# Patient Record
Sex: Female | Born: 1950 | Race: Asian | Hispanic: No | Marital: Married | Smoking: Never smoker
Health system: Southern US, Community
[De-identification: ages and names within clinical notes are randomized; demographics above are authoritative.]

## PROBLEM LIST (undated history)

## (undated) DIAGNOSIS — I10 Essential (primary) hypertension: Secondary | ICD-10-CM

## (undated) DIAGNOSIS — Z9889 Other specified postprocedural states: Secondary | ICD-10-CM

## (undated) DIAGNOSIS — E785 Hyperlipidemia, unspecified: Secondary | ICD-10-CM

## (undated) DIAGNOSIS — F329 Major depressive disorder, single episode, unspecified: Secondary | ICD-10-CM

## (undated) DIAGNOSIS — R42 Dizziness and giddiness: Secondary | ICD-10-CM

## (undated) DIAGNOSIS — Z860100 Personal history of colon polyps, unspecified: Secondary | ICD-10-CM

## (undated) DIAGNOSIS — I351 Nonrheumatic aortic (valve) insufficiency: Secondary | ICD-10-CM

## (undated) DIAGNOSIS — E119 Type 2 diabetes mellitus without complications: Secondary | ICD-10-CM

## (undated) DIAGNOSIS — G4700 Insomnia, unspecified: Secondary | ICD-10-CM

## (undated) DIAGNOSIS — K219 Gastro-esophageal reflux disease without esophagitis: Secondary | ICD-10-CM

## (undated) DIAGNOSIS — K279 Peptic ulcer, site unspecified, unspecified as acute or chronic, without hemorrhage or perforation: Secondary | ICD-10-CM

## (undated) DIAGNOSIS — Z9289 Personal history of other medical treatment: Secondary | ICD-10-CM

## (undated) DIAGNOSIS — F32A Depression, unspecified: Secondary | ICD-10-CM

## (undated) HISTORY — DX: Peptic ulcer, site unspecified, unspecified as acute or chronic, without hemorrhage or perforation: K27.9

## (undated) HISTORY — DX: Hyperlipidemia, unspecified: E78.5

## (undated) HISTORY — DX: Nonrheumatic aortic (valve) insufficiency: I35.1

## (undated) HISTORY — DX: Gastro-esophageal reflux disease without esophagitis: K21.9

## (undated) HISTORY — DX: Dizziness and giddiness: R42

## (undated) HISTORY — DX: Depression, unspecified: F32.A

## (undated) HISTORY — DX: Personal history of colonic polyps: Z86.010

## (undated) HISTORY — DX: Personal history of other medical treatment: Z92.89

## (undated) HISTORY — DX: Insomnia, unspecified: G47.00

## (undated) HISTORY — PX: NO PAST SURGERIES: SHX2092

## (undated) HISTORY — DX: Other specified postprocedural states: Z98.890

## (undated) HISTORY — DX: Major depressive disorder, single episode, unspecified: F32.9

## (undated) HISTORY — DX: Personal history of colon polyps, unspecified: Z86.0100

## (undated) HISTORY — DX: Type 2 diabetes mellitus without complications: E11.9

---

## 2000-12-02 ENCOUNTER — Other Ambulatory Visit: Admission: RE | Admit: 2000-12-02 | Discharge: 2000-12-02 | Payer: Self-pay | Admitting: Family Medicine

## 2000-12-20 ENCOUNTER — Ambulatory Visit (HOSPITAL_COMMUNITY): Admission: RE | Admit: 2000-12-20 | Discharge: 2000-12-20 | Payer: Self-pay | Admitting: Gastroenterology

## 2000-12-20 ENCOUNTER — Encounter (INDEPENDENT_AMBULATORY_CARE_PROVIDER_SITE_OTHER): Payer: Self-pay | Admitting: Specialist

## 2002-05-22 ENCOUNTER — Encounter: Admission: RE | Admit: 2002-05-22 | Discharge: 2002-05-22 | Payer: Self-pay | Admitting: *Deleted

## 2002-05-22 ENCOUNTER — Encounter: Payer: Self-pay | Admitting: *Deleted

## 2002-09-08 ENCOUNTER — Encounter: Admission: RE | Admit: 2002-09-08 | Discharge: 2002-09-08 | Payer: Self-pay | Admitting: Cardiology

## 2002-09-08 ENCOUNTER — Encounter: Payer: Self-pay | Admitting: Cardiology

## 2002-09-17 ENCOUNTER — Ambulatory Visit (HOSPITAL_COMMUNITY): Admission: RE | Admit: 2002-09-17 | Discharge: 2002-09-17 | Payer: Self-pay | Admitting: Cardiology

## 2002-09-17 ENCOUNTER — Encounter: Payer: Self-pay | Admitting: Cardiology

## 2002-10-02 ENCOUNTER — Ambulatory Visit (HOSPITAL_COMMUNITY): Admission: RE | Admit: 2002-10-02 | Discharge: 2002-10-02 | Payer: Self-pay | Admitting: Cardiology

## 2002-10-02 ENCOUNTER — Encounter (INDEPENDENT_AMBULATORY_CARE_PROVIDER_SITE_OTHER): Payer: Self-pay | Admitting: Cardiology

## 2004-08-14 ENCOUNTER — Encounter: Admission: RE | Admit: 2004-08-14 | Discharge: 2004-08-14 | Payer: Self-pay | Admitting: Gastroenterology

## 2004-08-24 ENCOUNTER — Encounter (INDEPENDENT_AMBULATORY_CARE_PROVIDER_SITE_OTHER): Payer: Self-pay | Admitting: *Deleted

## 2004-08-24 ENCOUNTER — Ambulatory Visit (HOSPITAL_COMMUNITY): Admission: RE | Admit: 2004-08-24 | Discharge: 2004-08-24 | Payer: Self-pay | Admitting: Gastroenterology

## 2004-10-12 ENCOUNTER — Ambulatory Visit (HOSPITAL_COMMUNITY): Admission: RE | Admit: 2004-10-12 | Discharge: 2004-10-12 | Payer: Self-pay | Admitting: Gastroenterology

## 2006-05-06 ENCOUNTER — Encounter: Admission: RE | Admit: 2006-05-06 | Discharge: 2006-05-06 | Payer: Self-pay | Admitting: Family Medicine

## 2007-01-19 ENCOUNTER — Encounter: Admission: RE | Admit: 2007-01-19 | Discharge: 2007-01-19 | Payer: Self-pay | Admitting: Family Medicine

## 2008-01-23 ENCOUNTER — Ambulatory Visit: Payer: Self-pay | Admitting: Internal Medicine

## 2008-01-26 ENCOUNTER — Ambulatory Visit: Payer: Self-pay | Admitting: *Deleted

## 2008-02-13 ENCOUNTER — Ambulatory Visit (HOSPITAL_COMMUNITY): Admission: RE | Admit: 2008-02-13 | Discharge: 2008-02-13 | Payer: Self-pay | Admitting: Family Medicine

## 2008-03-15 ENCOUNTER — Ambulatory Visit: Payer: Self-pay | Admitting: Internal Medicine

## 2008-03-15 ENCOUNTER — Encounter: Payer: Self-pay | Admitting: Family Medicine

## 2008-03-15 LAB — CONVERTED CEMR LAB
ALT: 17 units/L (ref 0–35)
AST: 20 units/L (ref 0–37)
Albumin: 4.6 g/dL (ref 3.5–5.2)
Alkaline Phosphatase: 57 units/L (ref 39–117)
BUN: 13 mg/dL (ref 6–23)
Basophils Absolute: 0 10*3/uL (ref 0.0–0.1)
Basophils Relative: 0 % (ref 0–1)
CO2: 27 meq/L (ref 19–32)
Calcium: 10 mg/dL (ref 8.4–10.5)
Chloride: 104 meq/L (ref 96–112)
Cholesterol: 193 mg/dL (ref 0–200)
Creatinine, Ser: 0.79 mg/dL (ref 0.40–1.20)
Eosinophils Absolute: 0.1 10*3/uL (ref 0.0–0.7)
Eosinophils Relative: 3 % (ref 0–5)
FSH: 61.7 milliintl units/mL
Glucose, Bld: 92 mg/dL (ref 70–99)
HCT: 42.4 % (ref 36.0–46.0)
HDL: 43 mg/dL (ref >39)
Hemoglobin: 13.7 g/dL (ref 12.0–15.0)
LDL Cholesterol: 109 mg/dL — ABNORMAL HIGH (ref 0–99)
Lymphocytes Relative: 44 % (ref 12–46)
Lymphs Abs: 2.3 10*3/uL (ref 0.7–4.0)
MCHC: 32.3 g/dL (ref 30.0–36.0)
MCV: 90.6 fL (ref 78.0–100.0)
Monocytes Absolute: 0.3 10*3/uL (ref 0.1–1.0)
Monocytes Relative: 6 % (ref 3–12)
Neutro Abs: 2.4 10*3/uL (ref 1.7–7.7)
Neutrophils Relative %: 47 % (ref 43–77)
Platelets: 356 10*3/uL (ref 150–400)
Potassium: 5 meq/L (ref 3.5–5.3)
RBC: 4.68 M/uL (ref 3.87–5.11)
RDW: 13.9 % (ref 11.5–15.5)
Sodium: 146 meq/L — ABNORMAL HIGH (ref 135–145)
TSH: 0.869 microintl units/mL (ref 0.350–4.50)
Total Bilirubin: 0.6 mg/dL (ref 0.3–1.2)
Total CHOL/HDL Ratio: 4.5
Total Protein: 7.5 g/dL (ref 6.0–8.3)
Triglycerides: 206 mg/dL — ABNORMAL HIGH (ref <150)
VLDL: 41 mg/dL — ABNORMAL HIGH (ref 0–40)
WBC: 5.1 10*3/uL (ref 4.0–10.5)

## 2008-03-22 ENCOUNTER — Encounter: Payer: Self-pay | Admitting: Family Medicine

## 2008-03-22 ENCOUNTER — Ambulatory Visit: Payer: Self-pay | Admitting: Internal Medicine

## 2008-05-10 ENCOUNTER — Ambulatory Visit: Payer: Self-pay | Admitting: Internal Medicine

## 2008-06-07 ENCOUNTER — Ambulatory Visit: Payer: Self-pay | Admitting: Family Medicine

## 2008-06-21 ENCOUNTER — Ambulatory Visit: Payer: Self-pay | Admitting: Internal Medicine

## 2008-06-24 ENCOUNTER — Ambulatory Visit: Payer: Self-pay | Admitting: Internal Medicine

## 2008-07-12 ENCOUNTER — Encounter: Payer: Self-pay | Admitting: Family Medicine

## 2008-07-12 ENCOUNTER — Ambulatory Visit: Payer: Self-pay | Admitting: Internal Medicine

## 2008-07-12 LAB — CONVERTED CEMR LAB
ALT: 11 units/L (ref 0–35)
AST: 16 units/L (ref 0–37)
Albumin: 4.6 g/dL (ref 3.5–5.2)
Alkaline Phosphatase: 50 units/L (ref 39–117)
BUN: 13 mg/dL (ref 6–23)
CO2: 27 meq/L (ref 19–32)
Calcium: 9.5 mg/dL (ref 8.4–10.5)
Chloride: 101 meq/L (ref 96–112)
Cholesterol: 216 mg/dL — ABNORMAL HIGH (ref 0–200)
Creatinine, Ser: 0.81 mg/dL (ref 0.40–1.20)
Glucose, Bld: 95 mg/dL (ref 70–99)
HDL: 46 mg/dL (ref >39)
Helicobacter Pylori Antibody-IgG: <0.4
LDL Cholesterol: 134 mg/dL — ABNORMAL HIGH (ref 0–99)
Potassium: 4.5 meq/L (ref 3.5–5.3)
Sodium: 141 meq/L (ref 135–145)
Total Bilirubin: 0.5 mg/dL (ref 0.3–1.2)
Total CHOL/HDL Ratio: 4.7
Total Protein: 7.3 g/dL (ref 6.0–8.3)
Triglycerides: 178 mg/dL — ABNORMAL HIGH (ref <150)
VLDL: 36 mg/dL (ref 0–40)

## 2008-07-13 ENCOUNTER — Ambulatory Visit: Payer: Self-pay | Admitting: Internal Medicine

## 2008-07-28 ENCOUNTER — Ambulatory Visit: Payer: Self-pay | Admitting: Internal Medicine

## 2008-10-06 ENCOUNTER — Ambulatory Visit (HOSPITAL_COMMUNITY): Admission: RE | Admit: 2008-10-06 | Discharge: 2008-10-06 | Payer: Self-pay | Admitting: Family Medicine

## 2008-10-06 ENCOUNTER — Ambulatory Visit: Payer: Self-pay | Admitting: Internal Medicine

## 2008-12-07 ENCOUNTER — Ambulatory Visit: Payer: Self-pay | Admitting: Internal Medicine

## 2008-12-20 ENCOUNTER — Encounter: Admission: RE | Admit: 2008-12-20 | Discharge: 2008-12-20 | Payer: Self-pay | Admitting: Pulmonary Disease

## 2009-01-05 ENCOUNTER — Ambulatory Visit: Payer: Self-pay | Admitting: Internal Medicine

## 2009-02-28 ENCOUNTER — Ambulatory Visit: Payer: Self-pay | Admitting: Internal Medicine

## 2009-03-07 ENCOUNTER — Ambulatory Visit: Payer: Self-pay | Admitting: Internal Medicine

## 2009-04-06 ENCOUNTER — Ambulatory Visit (HOSPITAL_COMMUNITY): Admission: RE | Admit: 2009-04-06 | Discharge: 2009-04-06 | Payer: Self-pay | Admitting: Internal Medicine

## 2009-05-30 ENCOUNTER — Ambulatory Visit: Payer: Self-pay | Admitting: Internal Medicine

## 2009-05-30 ENCOUNTER — Encounter: Payer: Self-pay | Admitting: Family Medicine

## 2009-05-30 LAB — CONVERTED CEMR LAB
Cholesterol: 156 mg/dL (ref 0–200)
HDL: 42 mg/dL (ref >39)
LDL Cholesterol: 74 mg/dL (ref 0–99)
Total CHOL/HDL Ratio: 3.7
Triglycerides: 198 mg/dL — ABNORMAL HIGH (ref <150)
VLDL: 40 mg/dL (ref 0–40)

## 2009-06-15 ENCOUNTER — Ambulatory Visit: Payer: Self-pay | Admitting: Family Medicine

## 2009-10-03 ENCOUNTER — Ambulatory Visit: Payer: Self-pay | Admitting: Internal Medicine

## 2009-10-03 LAB — CONVERTED CEMR LAB
BUN: 21 mg/dL (ref 6–23)
CO2: 25 meq/L (ref 19–32)
Calcium: 9.3 mg/dL (ref 8.4–10.5)
Chloride: 102 meq/L (ref 96–112)
Cholesterol: 154 mg/dL (ref 0–200)
Creatinine, Ser: 0.92 mg/dL (ref 0.40–1.20)
Glucose, Bld: 103 mg/dL — ABNORMAL HIGH (ref 70–99)
HDL: 41 mg/dL (ref >39)
LDL Cholesterol: 73 mg/dL (ref 0–99)
Potassium: 3.7 meq/L (ref 3.5–5.3)
Sodium: 143 meq/L (ref 135–145)
Total CHOL/HDL Ratio: 3.8
Triglycerides: 202 mg/dL — ABNORMAL HIGH (ref <150)
VLDL: 40 mg/dL (ref 0–40)

## 2009-10-10 ENCOUNTER — Ambulatory Visit: Payer: Self-pay | Admitting: Internal Medicine

## 2009-10-10 LAB — CONVERTED CEMR LAB
CRP: 0.2 mg/dL (ref <0.6)
Pro B Natriuretic peptide (BNP): 29.9 pg/mL (ref 0.0–100.0)

## 2009-10-18 ENCOUNTER — Encounter (INDEPENDENT_AMBULATORY_CARE_PROVIDER_SITE_OTHER): Payer: Self-pay | Admitting: Internal Medicine

## 2009-10-18 ENCOUNTER — Ambulatory Visit (HOSPITAL_COMMUNITY): Admission: RE | Admit: 2009-10-18 | Discharge: 2009-10-18 | Payer: Self-pay | Admitting: Internal Medicine

## 2009-11-09 ENCOUNTER — Ambulatory Visit: Payer: Self-pay | Admitting: Internal Medicine

## 2009-12-22 ENCOUNTER — Ambulatory Visit: Payer: Self-pay | Admitting: Internal Medicine

## 2009-12-22 LAB — CONVERTED CEMR LAB: Microalb, Ur: 0.5 mg/dL (ref 0.00–1.89)

## 2010-02-22 ENCOUNTER — Ambulatory Visit: Payer: Self-pay | Admitting: Internal Medicine

## 2010-03-02 ENCOUNTER — Ambulatory Visit: Payer: Self-pay | Admitting: Internal Medicine

## 2010-03-02 LAB — CONVERTED CEMR LAB
ALT: 70 units/L — ABNORMAL HIGH (ref 0–35)
AST: 43 units/L — ABNORMAL HIGH (ref 0–37)
Albumin: 4.9 g/dL (ref 3.5–5.2)
Alkaline Phosphatase: 69 units/L (ref 39–117)
BUN: 17 mg/dL (ref 6–23)
CO2: 27 meq/L (ref 19–32)
Calcium: 9.9 mg/dL (ref 8.4–10.5)
Chloride: 102 meq/L (ref 96–112)
Cholesterol: 154 mg/dL (ref 0–200)
Creatinine, Ser: 0.99 mg/dL (ref 0.40–1.20)
Glucose, Bld: 104 mg/dL — ABNORMAL HIGH (ref 70–99)
HDL: 43 mg/dL (ref >39)
LDL Cholesterol: 70 mg/dL (ref 0–99)
Potassium: 4.6 meq/L (ref 3.5–5.3)
Sodium: 144 meq/L (ref 135–145)
Total Bilirubin: 0.5 mg/dL (ref 0.3–1.2)
Total CHOL/HDL Ratio: 3.6
Total Protein: 8.1 g/dL (ref 6.0–8.3)
Triglycerides: 206 mg/dL — ABNORMAL HIGH (ref <150)
VLDL: 41 mg/dL — ABNORMAL HIGH (ref 0–40)

## 2010-04-18 ENCOUNTER — Ambulatory Visit: Payer: Self-pay | Admitting: Internal Medicine

## 2010-04-18 LAB — CONVERTED CEMR LAB
ALT: 72 units/L — ABNORMAL HIGH (ref 0–35)
AST: 44 units/L — ABNORMAL HIGH (ref 0–37)
Albumin: 4.8 g/dL (ref 3.5–5.2)
Alkaline Phosphatase: 66 units/L (ref 39–117)
BUN: 14 mg/dL (ref 6–23)
CO2: 27 meq/L (ref 19–32)
Calcium: 9.7 mg/dL (ref 8.4–10.5)
Chloride: 100 meq/L (ref 96–112)
Cholesterol: 155 mg/dL (ref 0–200)
Creatinine, Ser: 0.87 mg/dL (ref 0.40–1.20)
Glucose, Bld: 99 mg/dL (ref 70–99)
HDL: 40 mg/dL (ref >39)
LDL Cholesterol: 70 mg/dL (ref 0–99)
Potassium: 4.1 meq/L (ref 3.5–5.3)
Sodium: 140 meq/L (ref 135–145)
Total Bilirubin: 0.6 mg/dL (ref 0.3–1.2)
Total CHOL/HDL Ratio: 3.9
Total Protein: 7.7 g/dL (ref 6.0–8.3)
Triglycerides: 226 mg/dL — ABNORMAL HIGH (ref <150)
VLDL: 45 mg/dL — ABNORMAL HIGH (ref 0–40)

## 2010-07-25 ENCOUNTER — Encounter (INDEPENDENT_AMBULATORY_CARE_PROVIDER_SITE_OTHER): Payer: Self-pay | Admitting: Family Medicine

## 2010-07-25 LAB — CONVERTED CEMR LAB
ALT: 38 units/L — ABNORMAL HIGH (ref 0–35)
AST: 36 units/L (ref 0–37)
Albumin: 4.7 g/dL (ref 3.5–5.2)
Alkaline Phosphatase: 70 units/L (ref 39–117)
Bilirubin, Direct: 0.2 mg/dL (ref 0.0–0.3)
Cholesterol: 146 mg/dL (ref 0–200)
HDL: 45 mg/dL (ref >39)
Indirect Bilirubin: 0.7 mg/dL (ref 0.0–0.9)
LDL Cholesterol: 79 mg/dL (ref 0–99)
Total Bilirubin: 0.9 mg/dL (ref 0.3–1.2)
Total CHOL/HDL Ratio: 3.2
Total Protein: 7.6 g/dL (ref 6.0–8.3)
Triglycerides: 110 mg/dL (ref <150)
VLDL: 22 mg/dL (ref 0–40)

## 2010-12-22 NOTE — Procedures (Signed)
[place]  Patient:    Danielle Richard, Danielle Richard                           MRN: [id number] Proc. Date: 12/20/00 Attending:  Verlin Grills, M.D.                           Procedure Report  PROCEDURE INDICATION:  Ms. Evonda Enge (date of birth Mar 18, 1951), is a 60 year old female with unexplained abdominal pain and nonbloody diarrhea.  Ms. Chiem and her daughter (the translator) viewed our colonoscopy education film.  I discussed with them the complications associated with colonoscopy and polypectomy including intestinal bleeding and intestinal perforation.  The operative permit has been signed.  ENDOSCOPIST:  Verlin Grills, M.D.  PREMEDICATIONS:  Demerol 50 mg, Versed 7.5 mg.  ENDOSCOPE:  Olympus pediatric colonoscope.  PROCEDURE PERFORMED:  Esophagogastroduodenoscopy with small bowel biopsies.  DESCRIPTION OF PROCEDURE:  After obtaining informed consent, the patient was placed in the left lateral decubitus position.  I administered intravenous Demerol and intravenous Versed to achieve conscious sedation for the procedure.  The patients blood pressure, oxygen saturation and cardiac rhythm were monitored throughout the procedure and documented in the medical record.  The Olympus pediatric video colonoscope was passed through the posterior hypopharynx in the proximal esophagus without difficulty.  The hypopharynx, larynx and vocal cords appeared normal.  Esophagoscopy:  The proximal, mid and lower segments of the esophagus appeared normal.  Gastroscopy:  Retroflex view of the gastric, cardia and fundus was normal. The gastric body, antrum and pylorus appeared normal.  Duodenoscopy:  The duodenal bulb, mid duodenum, distal duodenum, and proximal jejunum appeared normal.  Five biopsies were taken from the second-third portions of the duodenum and sent for pathological evaluation.  ASSESSMENT:  Normal esophagogastroduodenoscopy.  Small  bowel biopsies pending.  PROCEDURE PERFORMED:  Proctocolonoscopy with distal ileoscopy.  Anal inspection was normal.  Digital rectal examination was normal.  The Olympus pediatric video colonoscope was introduced into the rectum, and under direct vision advanced to the cecum.  The ileocecal valve was intubated and the distal ileum inspected.  Colonic preparation for the examination today was excellent.  Rectum:  Normal.  Sigmoid colon and descending colon:  Normal.  Splenic flexure:  Normal.  Transverse colon:  Normal.  Hepatic flexure:  Normal.  Ascending colon:  Normal.  Cecum and ileocecal valve: Normal.  Distal ileum:  Normal.  Three biopsies were taken from the right colon and two biopsies were taken from the left colon and sent for pathological evaluation.  ASSESSMENT:  Normal proctocolonoscopy and distal ileoscopy.  Random colonic biopsies are pending. DD:  12/20/00 TD:  12/23/00 Job: [id number]

## 2010-12-22 NOTE — Op Note (Signed)
[name], [name]                     ACCOUNT NO.:  [id number]   MEDICAL RECORD NO.:  [id number]          PATIENT TYPE:  AMB   LOCATION:  ENDO                         FACILITY:  [facility]   PHYSICIAN:  Danise Edge, M.D.   DATE OF BIRTH:  [birth year]   DATE OF PROCEDURE:  08/24/2004  DATE OF DISCHARGE:                                 OPERATIVE REPORT   PROCEDURE:  Esophagogastroduodenoscopy with gastric polypectomy.   INDICATIONS FOR PROCEDURE:  Ms. Danielle Richard is a 60 year old female born [birth year].  Danielle Richard has unexplained epigastric discomfort.  Esophagogastroduodenoscopy, small bowel biopsies, colonoscopy and random  colonic biopsies were normal approximately two years ago.  CT scan of the  abdomen and pelvis performed a couple of weeks ago was normal.   ENDOSCOPIST:  Danise Edge, M.D.   PREMEDICATION:  Versed 5 mg, Demerol 50 mg.   DESCRIPTION OF PROCEDURE:   ENDOSCOPIST:  Danise Edge, M.D.   PREMEDICATION:  Versed 5 mg, Demerol 50 mg.   ENDOSCOPE:  Olympus gastroscope.   DESCRIPTION OF PROCEDURE:  After obtaining informed consent, Danielle Richard was  placed in the left lateral decubitus position. I administered intravenous  Demerol and intravenous Versed to achieve conscious sedation for the  procedure. The patient's blood pressure, oxygen saturation and cardiac  rhythm were monitored throughout the procedure and documented in the medical  record.   The Olympus gastroscope was passed through the posterior hypopharynx into  the proximal esophagus without difficulty. The hypopharynx, larynx and vocal  cords appeared normal.   ESOPHAGOSCOPY:  The proximal, mid and lower segments of the esophageal  mucosa appear normal.   GASTROSCOPY:  Retroflexed view of the gastric cardia was normal.  There is a  2 mm sessile polyp in the gastric fundus which was removed with the  electrocautery snare. The polypectomy site was clipped with an endoclip.  The gastric body, antrum and  pylorus appear normal. Two biopsies were taken  from the distal gastric antrum for pathology and a biopsy was taken for  CLOtest; all to look for signs of H. pylori gastritis.   DUODENOSCOPY:  The duodenal bulb, second portion of duodenum and third  portion of duodenum appear normal.   ASSESSMENT:  1.  A small polyp was removed from the gastric fundus.  2.  Biopsies were taken from the distal gastric antrum to rule out H.      pylori.  3.  Otherwise normal esophagogastroduodenoscopy.      MJ/MEDQ  D:  08/24/2004  T:  08/24/2004  Job:  [id number]   cc:   Windle Guard, M.D.  [address]  [place], [place] [address]  Fax: [phone]

## 2010-12-22 NOTE — Op Note (Signed)
Danielle Richard, Danielle Richard                     ACCOUNT NO.:  [id number]   MEDICAL RECORD NO.:  [id number]          PATIENT TYPE:  AMB   LOCATION:  ENDO                         FACILITY:  [facility]   PHYSICIAN:  Danise Edge, M.D.   DATE OF BIRTH:  1950-09-05   DATE OF PROCEDURE:  10/12/2004  DATE OF DISCHARGE:                                 OPERATIVE REPORT   PROCEDURE:  Diagnostic colonoscopy.   INDICATIONS:  Danielle Richard is a 60 year old female born 02-05-1951. Danielle Richard has unexplained left-sided abdominal pain. In 2002, her  esophagogastroduodenoscopy with small bowel biopsies were normal.  Proctocolonoscopy to the cecum was normal. Random colonic biopsies were  normal.   In early January 2006, CT scan of the abdomen and pelvis was normal.  On  August 24, 2004, esophagogastroduodenoscopy was normal except for the  presence of a small gastric fundic polyp which was removed and returned  gastric fundic gland mucosa without H. pylori.   ENDOSCOPIST:  Danise Edge, M.D.   PREMEDICATION:  Versed 10 mg, Demerol 75 mg.   DESCRIPTION OF PROCEDURE:  After obtaining informed consent, Danielle Richard was  placed in the left lateral decubitus position. I administered intravenous  Demerol and intravenous Versed to achieve conscious sedation for the  procedure. The patient's blood pressure, oxygen saturation and cardiac  rhythm were monitored throughout the procedure and documented in the medical  record.   Anal inspection and digital rectal exam were normal. The Olympus adjustable  pediatric colonoscope was introduced into the rectum and advanced to the  cecum. A normal appearing ileocecal valve was intubated and the distal ileum  inspected.  Colonic preparation for the exam today was excellent.   RECTUM:  Normal.   SIGMOID COLON AND DESCENDING COLON:  Normal.   SPLENIC FLEXURE:  Normal.   TRANSVERSE COLON:  Normal.   HEPATIC FLEXURE:  Normal.   ASCENDING COLON:  Normal.   CECUM AND  ILEOCECAL VALVE:  Normal.   DISTAL ILEUM:  Normal.   ASSESSMENT:  Normal proctocolonoscopy to the cecum with inspection of the  distal ileum.   RECOMMENDATIONS:  I can't explain Danielle Richard's left-sided abdominal  discomfort. She may have a form of irritable bowel syndrome. I recommend no  further gastrointestinal evaluation at this point.      MJ/MEDQ  D:  10/12/2004  T:  10/12/2004  Job:  [id number]   cc:   Windle Guard, M.D.  [address]  [place], [place] [address]  Fax: [phone]

## 2011-06-18 ENCOUNTER — Other Ambulatory Visit (HOSPITAL_COMMUNITY): Payer: Self-pay | Admitting: Family Medicine

## 2011-06-18 DIAGNOSIS — Z1231 Encounter for screening mammogram for malignant neoplasm of breast: Secondary | ICD-10-CM

## 2011-07-17 ENCOUNTER — Ambulatory Visit (HOSPITAL_COMMUNITY)
Admission: RE | Admit: 2011-07-17 | Discharge: 2011-07-17 | Disposition: A | Discharge status: Home / Self Care | Payer: Self-pay | Source: Ambulatory Visit | Attending: Family Medicine | Admitting: Family Medicine

## 2011-07-17 DIAGNOSIS — Z1231 Encounter for screening mammogram for malignant neoplasm of breast: Secondary | ICD-10-CM | POA: Insufficient documentation

## 2011-11-08 ENCOUNTER — Other Ambulatory Visit: Payer: Self-pay | Admitting: Family Medicine

## 2011-11-08 DIAGNOSIS — N63 Unspecified lump in unspecified breast: Secondary | ICD-10-CM

## 2011-11-08 DIAGNOSIS — N644 Mastodynia: Secondary | ICD-10-CM

## 2011-11-13 ENCOUNTER — Ambulatory Visit
Admission: RE | Admit: 2011-11-13 | Discharge: 2011-11-13 | Disposition: A | Discharge status: Home / Self Care | Payer: Self-pay | Source: Ambulatory Visit | Attending: Family Medicine | Admitting: Family Medicine

## 2011-11-13 DIAGNOSIS — N644 Mastodynia: Secondary | ICD-10-CM

## 2011-11-13 DIAGNOSIS — N63 Unspecified lump in unspecified breast: Secondary | ICD-10-CM

## 2012-09-19 ENCOUNTER — Encounter (HOSPITAL_COMMUNITY): Payer: Self-pay

## 2012-09-19 ENCOUNTER — Emergency Department (HOSPITAL_COMMUNITY)
Admission: EM | Admit: 2012-09-19 | Discharge: 2012-09-19 | Disposition: A | Discharge status: Home / Self Care | Payer: No Typology Code available for payment source | Source: Home / Self Care

## 2012-09-19 DIAGNOSIS — Z1231 Encounter for screening mammogram for malignant neoplasm of breast: Secondary | ICD-10-CM

## 2012-09-19 DIAGNOSIS — F32A Depression, unspecified: Secondary | ICD-10-CM | POA: Diagnosis present

## 2012-09-19 DIAGNOSIS — E785 Hyperlipidemia, unspecified: Secondary | ICD-10-CM | POA: Diagnosis present

## 2012-09-19 DIAGNOSIS — K219 Gastro-esophageal reflux disease without esophagitis: Secondary | ICD-10-CM | POA: Diagnosis present

## 2012-09-19 DIAGNOSIS — Z111 Encounter for screening for respiratory tuberculosis: Secondary | ICD-10-CM

## 2012-09-19 DIAGNOSIS — I10 Essential (primary) hypertension: Secondary | ICD-10-CM | POA: Diagnosis present

## 2012-09-19 DIAGNOSIS — Z860100 Personal history of colon polyps, unspecified: Secondary | ICD-10-CM

## 2012-09-19 DIAGNOSIS — F329 Major depressive disorder, single episode, unspecified: Secondary | ICD-10-CM | POA: Diagnosis present

## 2012-09-19 DIAGNOSIS — G4700 Insomnia, unspecified: Secondary | ICD-10-CM | POA: Diagnosis present

## 2012-09-19 HISTORY — DX: Essential (primary) hypertension: I10

## 2012-09-19 LAB — HEMOGLOBIN A1C
Hgb A1c MFr Bld: 5.9 % — ABNORMAL HIGH (ref <5.7)
Mean Plasma Glucose: 123 mg/dL — ABNORMAL HIGH (ref <117)

## 2012-09-19 LAB — COMPREHENSIVE METABOLIC PANEL
ALT: 12 U/L (ref 0–35)
AST: 23 U/L (ref 0–37)
Albumin: 4.7 g/dL (ref 3.5–5.2)
Alkaline Phosphatase: 61 U/L (ref 39–117)
BUN: 14 mg/dL (ref 6–23)
CO2: 32 mEq/L (ref 19–32)
Calcium: 10.4 mg/dL (ref 8.4–10.5)
Chloride: 100 mEq/L (ref 96–112)
Creatinine, Ser: 0.75 mg/dL (ref 0.50–1.10)
GFR calc Af Amer: >90 mL/min (ref >90)
GFR calc non Af Amer: >90 mL/min (ref >90)
Glucose, Bld: 83 mg/dL (ref 70–99)
Potassium: 4.1 mEq/L (ref 3.5–5.1)
Sodium: 143 mEq/L (ref 135–145)
Total Bilirubin: 0.3 mg/dL (ref 0.3–1.2)
Total Protein: 8.5 g/dL — ABNORMAL HIGH (ref 6.0–8.3)

## 2012-09-19 LAB — LIPID PANEL
Cholesterol: 230 mg/dL — ABNORMAL HIGH (ref 0–200)
HDL: 51 mg/dL (ref >39)
LDL Cholesterol: 136 mg/dL — ABNORMAL HIGH (ref 0–99)
Total CHOL/HDL Ratio: 4.5 RATIO
Triglycerides: 216 mg/dL — ABNORMAL HIGH (ref <150)
VLDL: 43 mg/dL — ABNORMAL HIGH (ref 0–40)

## 2012-09-19 LAB — TSH: TSH: 1.068 u[IU]/mL (ref 0.350–4.500)

## 2012-09-19 LAB — CBC
HCT: 42 % (ref 36.0–46.0)
Hemoglobin: 14.8 g/dL (ref 12.0–15.0)
MCH: 30.6 pg (ref 26.0–34.0)
MCHC: 35.2 g/dL (ref 30.0–36.0)
MCV: 86.8 fL (ref 78.0–100.0)
Platelets: 305 10*3/uL (ref 150–400)
RBC: 4.84 MIL/uL (ref 3.87–5.11)
RDW: 13 % (ref 11.5–15.5)
WBC: 5.3 10*3/uL (ref 4.0–10.5)

## 2012-09-19 MED ORDER — TRAZODONE HCL 100 MG PO TABS: 150.0000 mg | ORAL_TABLET | Freq: Every day | ORAL | Status: DC

## 2012-09-19 MED ORDER — CITALOPRAM HYDROBROMIDE 40 MG PO TABS: 40.0000 mg | ORAL_TABLET | Freq: Every day | ORAL | Status: DC

## 2012-09-19 MED ORDER — ATORVASTATIN CALCIUM 40 MG PO TABS: 40.0000 mg | ORAL_TABLET | Freq: Every day | ORAL | Status: DC

## 2012-09-19 MED ORDER — TRIAMTERENE-HCTZ 37.5-25 MG PO CAPS: 1.0000 | ORAL_CAPSULE | ORAL | Status: DC

## 2012-09-19 MED ORDER — RANITIDINE HCL 150 MG PO TABS: 150.0000 mg | ORAL_TABLET | Freq: Two times a day (BID) | ORAL | Status: DC

## 2012-09-19 MED ORDER — ZOLPIDEM TARTRATE 10 MG PO TABS: 10.0000 mg | ORAL_TABLET | Freq: Every evening | ORAL | Status: DC | PRN

## 2012-09-19 NOTE — ED Provider Notes (Signed)
History     CSN: [id number]  Arrival date & time 09/19/12  1059   First MD Initiated Contact with Patient 09/19/12 1123      Chief Complaint  Patient presents with  . Medication Refill    (Consider location/radiation/quality/duration/timing/severity/associated sxs/prior treatment) HPI Patient is having abdominal pain for 2-3 months. The pain is located in the left upper quadrant, no radiation, aching pain, present all the time, 7/10 in severity, no nausea or vomiting. Patient also complains of weight loss and night hot sweats at time. No TB exposure. Patient went to her home country and got herself checked out for abdominal pain. She brought the papers with her. The colonoscopy and EGD done suggests multiple colonic polyps. One of the colonic polyps was removed and sent for pathology and was found to Danielle Richard nonmalignant. Patient was told to followup with gastroenterology at this time.  She complains of insomnia which is not controlled with trazodone and requests some other medication.  She wants other medications for hypertension, depression to [name] refilled.   Past Medical History  Diagnosis Date  . Hypertension     History reviewed. No pertinent past surgical history.  No family history on file.  History  Substance Use Topics  . Smoking status: Not on file  . Smokeless tobacco: Not on file  . Alcohol Use: Not on file    OB History   Grav Para Term Preterm Abortions TAB SAB Ect Mult Living                  Review of Systems  Allergies  Aspirin  Home Medications   Current Outpatient Rx  Name  Route  Sig  Dispense  Refill  . atorvastatin (LIPITOR) 40 MG tablet   Oral   Take 40 mg by mouth daily.         . citalopram (CELEXA) 40 MG tablet   Oral   Take 40 mg by mouth daily.         . ranitidine (ZANTAC) 150 MG tablet   Oral   Take 150 mg by mouth 2 (two) times daily. Half tablet twice daily         . traZODone (DESYREL) 100 MG tablet   Oral   Take  100 mg by mouth at bedtime. One and one half tab at bedtime         . triamterene-hydrochlorothiazide (DYAZIDE) 37.5-25 MG per capsule   Oral   Take 1 capsule by mouth every morning.           BP 111/51  Pulse 58  Temp(Src) 98.7 F (37.1 C) (Oral)  SpO2 95%  Physical Exam Physical Exam: General: Vital signs reviewed and noted. Well-developed, well-nourished, in no acute distress; alert, appropriate and cooperative throughout examination.  Head: Normocephalic, atraumatic.  Eyes: PERRL, EOMI, No signs of anemia or jaundince.  Nose: Mucous membranes moist, not inflammed, nonerythematous.  Throat: Oropharynx nonerythematous, no exudate appreciated.   Neck: No deformities, masses, or tenderness noted.Supple, No carotid Bruits, no JVD.  Lungs:  Normal respiratory effort. Clear to auscultation BL without crackles or wheezes.  Heart: RRR. S1 and S2 normal without gallop, murmur, or rubs.  Abdomen:  BS normoactive. Soft, Nondistended, non-tender.  No masses or organomegaly.  Extremities: No pretibial edema.  Neurologic: A&O X3, CN II - XII are grossly intact. Motor strength is 5/5 in the all 4 extremities, Sensations intact to light touch, Cerebellar signs negative.  Skin: No visible rashes, scars.  ED Course  Procedures (including critical care time)  Labs Reviewed - No data to display No results found.   No diagnosis found. Patient Active Problem List  Diagnosis  . HTN (hypertension)  . HLD (hyperlipidemia)  . Depression  . GERD (gastroesophageal reflux disease)  . Insomnia  . Personal history of colonic polyps       MDM  1. patient will [name] sent for a GI evaluation at this time for her multiple colonic polyps. 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 I will check a TB skin test today. I will also check CBC and complete metabolic panel. 2. patient was prescribed Ambien 10 mg for  insomnia. Patient was given a 30 day supply with no refills at this time.  I have also given her 30 tablets of trazodone in case she is not able to tolerate Ambien. It was discussed that patient should not [name] on both medications at the same time which she agreed. 3. patient needs lipid panel, HbA1c, TSH which was done today. 4. patient will also need a mammogram scheduled.        Lars Mage, MD 09/19/12 1247

## 2012-09-19 NOTE — ED Notes (Signed)
Patient daughter states her mom has chronic abd pain, pain in knees.history of polyps in colon  High cholestorol, valve problem

## 2012-09-19 NOTE — ED Notes (Signed)
Tb test administered to left forearm- must return Monday 09/22/12 to [facility] read

## 2012-09-19 NOTE — ED Notes (Signed)
Skin test for TB ordered-administered

## 2012-09-19 NOTE — ED Notes (Signed)
PPD 0.1 ml drawn 09/19/12 @ 12:35 pm(Sue Rolley Sims) [employer] lot [id number] exp 09/15/13

## 2012-09-22 NOTE — ED Notes (Signed)
Pt returned for results of TB - Negative-lab results given

## 2012-09-24 NOTE — ED Notes (Signed)
Referral faxed to the breast center for mamogram has appt 09/30/12 @ 10:30

## 2012-09-30 ENCOUNTER — Ambulatory Visit (HOSPITAL_COMMUNITY): Payer: No Typology Code available for payment source

## 2012-10-03 ENCOUNTER — Ambulatory Visit (HOSPITAL_COMMUNITY)
Admission: RE | Admit: 2012-10-03 | Discharge: 2012-10-03 | Disposition: A | Discharge status: Home / Self Care | Payer: No Typology Code available for payment source | Source: Ambulatory Visit | Attending: Internal Medicine | Admitting: Internal Medicine

## 2012-10-03 DIAGNOSIS — Z1231 Encounter for screening mammogram for malignant neoplasm of breast: Secondary | ICD-10-CM | POA: Insufficient documentation

## 2012-10-03 NOTE — ED Notes (Signed)
Spoke with [facility] (JOE) generic of lipitor is giving him side affects  changed to brand only lipitor  Daughter aware

## 2012-10-24 ENCOUNTER — Other Ambulatory Visit: Payer: Self-pay | Admitting: *Deleted

## 2012-10-27 NOTE — Telephone Encounter (Signed)
Talked with pharmacy about reasons for denial.

## 2012-11-27 ENCOUNTER — Encounter (HOSPITAL_COMMUNITY): Payer: Self-pay

## 2012-11-27 ENCOUNTER — Emergency Department (HOSPITAL_COMMUNITY)
Admission: EM | Admit: 2012-11-27 | Discharge: 2012-11-27 | Disposition: A | Discharge status: Home Health | Payer: No Typology Code available for payment source | Source: Home / Self Care | Attending: Family Medicine | Admitting: Family Medicine

## 2012-11-27 DIAGNOSIS — E785 Hyperlipidemia, unspecified: Secondary | ICD-10-CM

## 2012-11-27 DIAGNOSIS — G4700 Insomnia, unspecified: Secondary | ICD-10-CM

## 2012-11-27 DIAGNOSIS — I10 Essential (primary) hypertension: Secondary | ICD-10-CM

## 2012-11-27 DIAGNOSIS — F32A Depression, unspecified: Secondary | ICD-10-CM

## 2012-11-27 DIAGNOSIS — Z86010 Personal history of colonic polyps: Secondary | ICD-10-CM

## 2012-11-27 DIAGNOSIS — F329 Major depressive disorder, single episode, unspecified: Secondary | ICD-10-CM

## 2012-11-27 DIAGNOSIS — K219 Gastro-esophageal reflux disease without esophagitis: Secondary | ICD-10-CM

## 2012-11-27 MED ORDER — ROSUVASTATIN CALCIUM 5 MG PO TABS: ORAL_TABLET | ORAL | Status: DC

## 2012-11-27 MED ORDER — TRAZODONE HCL 150 MG PO TABS: 150.0000 mg | ORAL_TABLET | Freq: Every day | ORAL | Status: DC

## 2012-11-27 MED ORDER — CITALOPRAM HYDROBROMIDE 40 MG PO TABS: 40.0000 mg | ORAL_TABLET | Freq: Every day | ORAL | Status: DC

## 2012-11-27 MED ORDER — TRIAMTERENE-HCTZ 37.5-25 MG PO CAPS: 1.0000 | ORAL_CAPSULE | Freq: Every day | ORAL | Status: DC

## 2012-11-27 MED ORDER — RANITIDINE HCL 150 MG PO TABS: 150.0000 mg | ORAL_TABLET | Freq: Two times a day (BID) | ORAL | Status: DC

## 2012-11-27 NOTE — ED Provider Notes (Signed)
History     CSN: [id number]  Arrival date & time 11/27/12  1228   First MD Initiated Contact with Patient 11/27/12 1250      Chief Complaint  Patient presents with  . Abdominal Pain    (Consider location/radiation/quality/duration/timing/severity/associated sxs/prior treatment) Patient is a 62 y.o. female presenting with abdominal pain. The history is provided by the patient. The history is limited by a language barrier. A language interpreter was used.  Abdominal Pain  Pt presents today to follow up.  Pt says that she is still having difficulty sleeping.  She was never able to follow up with GI because she could not afford to go to the appointment.  She has multiple colon polyps and was seen in [place].  She was told that she needed to have a repeat colonoscopy in 2 months but never followed up.  Pt says that she has had pain under left breast for over 2 years, constance, no shortness  Past Medical History  Diagnosis Date  . Hypertension     History reviewed. No pertinent past surgical history.  No family history on file.  History  Substance Use Topics  . Smoking status: Not on file  . Smokeless tobacco: Not on file  . Alcohol Use: Not on file    OB History   Grav Para Term Preterm Abortions TAB SAB Ect Mult Living                  Review of Systems  Gastrointestinal: Positive for abdominal pain.   Constitutional: Negative.  HENT: Negative.  Respiratory: Negative.  Cardiovascular: Negative.  Gastrointestinal: Negative.  Endocrine: Negative.  Genitourinary: Negative.  Musculoskeletal: joint pains  Skin: Negative.  Allergic/Immunologic: Negative.  Neurological: Negative.  Hematological: Negative.  Psychiatric/Behavioral: Negative.  All other systems reviewed and are negative   Allergies  Aspirin  Home Medications   Current Outpatient Rx  Name  Route  Sig  Dispense  Refill  . atorvastatin (LIPITOR) 40 MG tablet   Oral   Take 1 tablet (40 mg total) by  mouth daily.   90 tablet   1   . citalopram (CELEXA) 40 MG tablet   Oral   Take 1 tablet (40 mg total) by mouth daily.   90 tablet   1   . ranitidine (ZANTAC) 150 MG tablet   Oral   Take 1 tablet (150 mg total) by mouth 2 (two) times daily. Half tablet twice daily   180 tablet   1   . traZODone (DESYREL) 100 MG tablet   Oral   Take 1.5 tablets (150 mg total) by mouth at bedtime. One and one half tab at bedtime   30 tablet   0   . triamterene-hydrochlorothiazide (DYAZIDE) 37.5-25 MG per capsule   Oral   Take 1 each (1 capsule total) by mouth every morning.   90 capsule   1   . EXPIRED: zolpidem (AMBIEN) 10 MG tablet   Oral   Take 1 tablet (10 mg total) by mouth at bedtime as needed for sleep.   30 tablet   0     BP 142/76  Pulse 59  Temp(Src) 97.7 F (36.5 C) (Oral)  Resp 17  SpO2 98%  Physical Exam Nursing note and vitals reviewed.  Constitutional: She is oriented to person, place, and time. She appears well-developed and well-nourished. No distress.  HENT:  Head: Normocephalic and atraumatic.  Eyes: Conjunctivae and EOM are normal. Pupils are equal, round, and reactive to light.  Neck: Normal range of motion. Neck supple. No JVD present. No tracheal deviation present. No thyromegaly present.  Cardiovascular: Normal rate, regular rhythm and normal heart sounds.  Pulmonary/Chest: Effort normal and breath sounds normal. No respiratory distress. She has no wheezes.  Abdominal: Soft. Bowel sounds are normal.  Musculoskeletal: Normal range of motion. She exhibits no edema and no tenderness.  Lymphadenopathy:  She has no cervical adenopathy.  Neurological: She is alert and oriented to person, place, and time. She has normal reflexes.  Skin: Skin is warm and dry.  Psychiatric: She has a normal mood and affect. Her behavior is normal. Judgment and thought content normal.   ED Course  Procedures (including critical care time)  Labs Reviewed - No data to  display No results found.  No diagnosis found.  MDM  IMPRESSION  Hypertension  Hyperlipidemia  GERD  Chronic Insomnia  Depression   Chronic Chest Pain  ? Valve disorder   Colon Polyps - multiple per history   RECOMMENDATIONS / PLAN Pt has been having difficulty explaining her medical conditions because of language difficulties.  With the interpreter present I tried to explain plan of care.  Will try Crestor 5 mg take 1 po every 3 days.  She can get this from the [facility] program for assistance.  Refilled her regular medications today.  Pt wants to go back to Trazodone for sleep.    Refer to GI and Refer to cardiology for evaluation   FOLLOW UP 2 months for fasting labs tests and OV follow up with MD   The patient was given clear instructions to go to ER or return to medical center if symptoms don't improve, worsen or new problems develop.  The patient verbalized understanding.  The patient was told to call to get lab results if they haven't heard anything in the next week.            Cleora Fleet, MD 11/27/12 1323

## 2012-11-27 NOTE — ED Notes (Signed)
Complains of pain under left breast area Joint pain as well - knee area Insomnia

## 2012-12-02 NOTE — ED Notes (Signed)
Patient has an appointment at [facility] 12/17/12 for mutiple colon polyps

## 2012-12-02 NOTE — ED Notes (Signed)
Patient has appt 01/01/13 10 am [facility]

## 2012-12-31 ENCOUNTER — Encounter: Payer: Self-pay | Admitting: *Deleted

## 2012-12-31 ENCOUNTER — Encounter: Payer: Self-pay | Admitting: Cardiology

## 2013-01-01 ENCOUNTER — Ambulatory Visit (INDEPENDENT_AMBULATORY_CARE_PROVIDER_SITE_OTHER): Payer: No Typology Code available for payment source | Admitting: Cardiology

## 2013-01-01 ENCOUNTER — Encounter: Payer: Self-pay | Admitting: Cardiology

## 2013-01-01 VITALS — BP 109/78 | HR 60 | Wt 111.0 lb

## 2013-01-01 DIAGNOSIS — I351 Nonrheumatic aortic (valve) insufficiency: Secondary | ICD-10-CM | POA: Insufficient documentation

## 2013-01-01 DIAGNOSIS — R079 Chest pain, unspecified: Secondary | ICD-10-CM | POA: Insufficient documentation

## 2013-01-01 DIAGNOSIS — I359 Nonrheumatic aortic valve disorder, unspecified: Secondary | ICD-10-CM

## 2013-01-01 DIAGNOSIS — E785 Hyperlipidemia, unspecified: Secondary | ICD-10-CM

## 2013-01-01 DIAGNOSIS — I10 Essential (primary) hypertension: Secondary | ICD-10-CM

## 2013-01-01 NOTE — Patient Instructions (Addendum)
Your physician recommends that you schedule a follow-up appointment in: AS NEEDED PENDING TEST RESULTS  Your physician has requested that you have an echocardiogram. Echocardiography is a painless test that uses sound waves to create images of your heart. It provides your doctor with information about the size and shape of your heart and how well your heart's chambers and valves are working. This procedure takes approximately one hour. There are no restrictions for this procedure.    

## 2013-01-01 NOTE — Assessment & Plan Note (Signed)
Symptoms are not consistent with cardiac etiology. Electrocardiogram normal. No plans for further ischemia evaluation. Question GI component. She will followup with her primary care physician for this issue.

## 2013-01-01 NOTE — Assessment & Plan Note (Signed)
Previous echo with mild to moderate aortic insufficiency. Repeat echo.

## 2013-01-01 NOTE — Assessment & Plan Note (Signed)
Continue present medications. 

## 2013-01-01 NOTE — Assessment & Plan Note (Signed)
Continue statin. Management per primary care. 

## 2013-01-01 NOTE — Progress Notes (Signed)
HPI: 62 year old female for evaluation of chest pain. Previously seen by Dr. Donnie Aho. No records available. Nuclear study in 2004 showed an ejection fraction of 74%. There was no ischemia or infarction. Echocardiogram in March of 2011 showed normal LV function, grade 1 diastolic dysfunction, mild to moderate aortic insufficiency and mild mitral regurgitation. Note patient does not speaking which and history is obtained with the assistance of her daughter. She has had chest pain for approximately 2 years. It is in the left breast area and epigastric area. It increases with eating. It is not exertional. It does not radiate. No associated nausea or diaphoresis. It lasts 2 days at a time without completely resolving. She has mild dyspnea on exertion but no orthopnea, PND, pedal edema or syncope.  Current Outpatient Prescriptions  Medication Sig Dispense Refill  . citalopram (CELEXA) 40 MG tablet Take 1 tablet (40 mg total) by mouth daily.  30 tablet  4  . ranitidine (ZANTAC) 150 MG tablet Half tablet twice daily      . rosuvastatin (CRESTOR) 5 MG tablet Take 1 po every 3 days for cholesterol  30 tablet  4  . traZODone (DESYREL) 150 MG tablet Take 1 tablet (150 mg total) by mouth at bedtime.  30 tablet  3  . triamterene-hydrochlorothiazide (DYAZIDE) 37.5-25 MG per capsule Take 1 each (1 capsule total) by mouth daily.  30 capsule  5  . [DISCONTINUED] atorvastatin (LIPITOR) 40 MG tablet Take 1 tablet (40 mg total) by mouth daily.  90 tablet  1  . [DISCONTINUED] zolpidem (AMBIEN) 10 MG tablet Take 1 tablet (10 mg total) by mouth at bedtime as needed for sleep.  30 tablet  0   No current facility-administered medications for this visit.    Allergies  Allergen Reactions  . Aspirin     Past Medical History  Diagnosis Date  . Hypertension   . HLD (hyperlipidemia)   . Depression   . Personal history of colonic polyps   . GERD (gastroesophageal reflux disease)   . Insomnia   . Aortic insufficiency      History reviewed. No pertinent past surgical history.  History   Social History  . Marital Status: Married    Spouse Name: N/A    Number of Children: 3  . Years of Education: N/A   Occupational History  . Not on file.   Social History Main Topics  . Smoking status: Never Smoker   . Smokeless tobacco: Not on file  . Alcohol Use: No  . Drug Use: No  . Sexually Active: Not on file   Other Topics Concern  . Not on file   Social History Narrative  . No narrative on file    Family History  Problem Relation Age of Onset  . CAD Mother     ROS: no fevers or chills, productive cough, hemoptysis, dysphasia, odynophagia, melena, hematochezia, dysuria, hematuria, rash, seizure activity, orthopnea, PND, pedal edema, claudication. No acholic stools. Remaining systems are negative.  Physical Exam:   Blood pressure 109/78, pulse 60, weight 111 lb (50.349 kg).  General:  Well developed/well nourished in NAD Skin warm/dry Patient not depressed No peripheral clubbing Back-normal HEENT-normal/normal eyelids Neck supple/normal carotid upstroke bilaterally; no bruits; no JVD; no thyromegaly chest - CTA/ normal expansion CV - RRR/normal S1 and S2; no rubs or gallops;  PMI nondisplaced, 2/6 systolic murmur left sternal border. No diastolic murmur. Abdomen -NT/ND, no HSM, no mass, + bowel sounds, no bruit 2+ femoral pulses, no bruits Ext-no edema,  chords, 2+ DP Neuro-grossly nonfocal  ECG 11/27/2012-sinus bradycardia with no ST changes; right ventricular conduction delay.

## 2013-01-12 ENCOUNTER — Ambulatory Visit (HOSPITAL_COMMUNITY): Payer: No Typology Code available for payment source | Attending: Cardiology | Admitting: Radiology

## 2013-01-12 ENCOUNTER — Other Ambulatory Visit: Payer: Self-pay

## 2013-01-12 DIAGNOSIS — K219 Gastro-esophageal reflux disease without esophagitis: Secondary | ICD-10-CM | POA: Insufficient documentation

## 2013-01-12 DIAGNOSIS — I10 Essential (primary) hypertension: Secondary | ICD-10-CM | POA: Insufficient documentation

## 2013-01-12 DIAGNOSIS — E785 Hyperlipidemia, unspecified: Secondary | ICD-10-CM | POA: Insufficient documentation

## 2013-01-12 DIAGNOSIS — I359 Nonrheumatic aortic valve disorder, unspecified: Secondary | ICD-10-CM

## 2013-01-12 NOTE — Progress Notes (Signed)
Echocardiogram performed.  

## 2013-01-13 ENCOUNTER — Telehealth: Payer: Self-pay

## 2013-01-13 NOTE — Telephone Encounter (Signed)
Pt's daughter says pt's previous GI referral appt was canceled because specialist is out of [facility] network and family could not afford charge.  Pt's daughter asking if refer mother to pt to GI specialist in-network. Please f/u with pt's daughter, Luciano Cutter.

## 2013-01-14 NOTE — Telephone Encounter (Signed)
I spoke to pt's daughter and I explain to her that the majority of specialist the orange card won't cover.

## 2013-01-16 ENCOUNTER — Ambulatory Visit: Payer: No Typology Code available for payment source | Attending: Family Medicine | Admitting: Family Medicine

## 2013-01-16 VITALS — BP 111/66 | HR 83 | Temp 98.3°F | Resp 16 | Wt 113.0 lb

## 2013-01-16 DIAGNOSIS — K219 Gastro-esophageal reflux disease without esophagitis: Secondary | ICD-10-CM

## 2013-01-16 DIAGNOSIS — E785 Hyperlipidemia, unspecified: Secondary | ICD-10-CM

## 2013-01-16 DIAGNOSIS — R7309 Other abnormal glucose: Secondary | ICD-10-CM

## 2013-01-16 DIAGNOSIS — F329 Major depressive disorder, single episode, unspecified: Secondary | ICD-10-CM

## 2013-01-16 DIAGNOSIS — G4700 Insomnia, unspecified: Secondary | ICD-10-CM

## 2013-01-16 DIAGNOSIS — Z860100 Personal history of colon polyps, unspecified: Secondary | ICD-10-CM

## 2013-01-16 DIAGNOSIS — I351 Nonrheumatic aortic (valve) insufficiency: Secondary | ICD-10-CM

## 2013-01-16 DIAGNOSIS — I359 Nonrheumatic aortic valve disorder, unspecified: Secondary | ICD-10-CM

## 2013-01-16 DIAGNOSIS — R109 Unspecified abdominal pain: Secondary | ICD-10-CM

## 2013-01-16 DIAGNOSIS — I10 Essential (primary) hypertension: Secondary | ICD-10-CM

## 2013-01-16 DIAGNOSIS — R7303 Prediabetes: Secondary | ICD-10-CM

## 2013-01-16 DIAGNOSIS — F32A Depression, unspecified: Secondary | ICD-10-CM

## 2013-01-16 DIAGNOSIS — Z0000 Encounter for general adult medical examination without abnormal findings: Secondary | ICD-10-CM

## 2013-01-16 LAB — LIPID PANEL
Cholesterol: 129 mg/dL (ref 0–200)
HDL: 35 mg/dL — ABNORMAL LOW (ref >39)
LDL Cholesterol: 26 mg/dL (ref 0–99)
Total CHOL/HDL Ratio: 3.7 Ratio
Triglycerides: 341 mg/dL — ABNORMAL HIGH (ref <150)
VLDL: 68 mg/dL — ABNORMAL HIGH (ref 0–40)

## 2013-01-16 LAB — COMPREHENSIVE METABOLIC PANEL
ALT: 26 U/L (ref 0–35)
AST: 24 U/L (ref 0–37)
Albumin: 4.7 g/dL (ref 3.5–5.2)
Alkaline Phosphatase: 58 U/L (ref 39–117)
BUN: 10 mg/dL (ref 6–23)
CO2: 31 mEq/L (ref 19–32)
Calcium: 9.2 mg/dL (ref 8.4–10.5)
Chloride: 101 mEq/L (ref 96–112)
Creat: 0.79 mg/dL (ref 0.50–1.10)
Glucose, Bld: 117 mg/dL — ABNORMAL HIGH (ref 70–99)
Potassium: 3.8 mEq/L (ref 3.5–5.3)
Sodium: 142 mEq/L (ref 135–145)
Total Bilirubin: 0.3 mg/dL (ref 0.3–1.2)
Total Protein: 7 g/dL (ref 6.0–8.3)

## 2013-01-16 NOTE — Progress Notes (Signed)
Patient ID: Danielle Richard, female   DOB: [birth year], 62 y.o.   MRN: [id number]  CC: follow up   HPI: Patient reports that she's been feeling well.  She reports that she was not able to go to the gastroenterologist that her cardiologist had recommended for her because of the expense of cost.  She reports that she still does occasionally have abdominal pain.  She is due for his colonoscopy.  She has a history of colon polyps and was supposed to have a followup colonoscopy in 5 years.  She is past due for this.  Allergies  Allergen Reactions  . Aspirin    Past Medical History  Diagnosis Date  . Hypertension   . HLD (hyperlipidemia)   . Depression   . Personal history of colonic polyps   . GERD (gastroesophageal reflux disease)   . Insomnia   . Aortic insufficiency    Current Outpatient Prescriptions on File Prior to Visit  Medication Sig Dispense Refill  . citalopram (CELEXA) 40 MG tablet Take 1 tablet (40 mg total) by mouth daily.  30 tablet  4  . ranitidine (ZANTAC) 150 MG tablet Half tablet twice daily      . rosuvastatin (CRESTOR) 5 MG tablet Take 1 po every 3 days for cholesterol  30 tablet  4  . traZODone (DESYREL) 150 MG tablet Take 1 tablet (150 mg total) by mouth at bedtime.  30 tablet  3  . triamterene-hydrochlorothiazide (DYAZIDE) 37.5-25 MG per capsule Take 1 each (1 capsule total) by mouth daily.  30 capsule  5  . [DISCONTINUED] atorvastatin (LIPITOR) 40 MG tablet Take 1 tablet (40 mg total) by mouth daily.  90 tablet  1  . [DISCONTINUED] zolpidem (AMBIEN) 10 MG tablet Take 1 tablet (10 mg total) by mouth at bedtime as needed for sleep.  30 tablet  0   No current facility-administered medications on file prior to visit.   Family History  Problem Relation Age of Onset  . CAD Mother    History   Social History  . Marital Status: Married    Spouse Name: N/A    Number of Children: 3  . Years of Education: N/A   Occupational History  . Not on file.   Social History  Main Topics  . Smoking status: Never Smoker   . Smokeless tobacco: Not on file  . Alcohol Use: No  . Drug Use: No  . Sexually Active: Not on file   Other Topics Concern  . Not on file   Social History Narrative  . No narrative on file    Review of Systems  Constitutional: Negative for fever, chills, diaphoresis, activity change, appetite change and fatigue.  HENT: Negative for ear pain, nosebleeds, congestion, facial swelling, rhinorrhea, neck pain, neck stiffness and ear discharge.   Eyes: Negative for pain, discharge, redness, itching and visual disturbance.  Respiratory: Negative for cough, choking, chest tightness, shortness of breath, wheezing and stridor.   Cardiovascular: Negative for chest pain, palpitations and leg swelling.  Gastrointestinal: Negative for abdominal distention.  abdominal pain , Acid reflux symptoms Genitourinary: Negative for dysuria, urgency, frequency, hematuria, flank pain, decreased urine volume, difficulty urinating and dyspareunia.  Musculoskeletal: Negative for back pain, joint swelling, arthralgias and gait problem.  Neurological: Negative for dizziness, tremors, seizures, syncope, facial asymmetry, speech difficulty, weakness, light-headedness, numbness and headaches.  Hematological: Negative for adenopathy. Does not bruise/bleed easily.  Psychiatric/Behavioral: Negative for hallucinations, behavioral problems, confusion, dysphoric mood, decreased concentration and agitation.  Objective:   Filed Vitals:   01/16/13 1226  BP: 111/66  Pulse: 83  Temp: 98.3 F (36.8 C)  Resp: 16    Physical Exam  Constitutional: Appears well-developed and well-nourished. No distress.  HENT: Normocephalic. External right and left ear normal. Oropharynx is clear and moist.  Eyes: Conjunctivae and EOM are normal. PERRLA, no scleral icterus.  Neck: Normal ROM. Neck supple. No JVD. No tracheal deviation. No thyromegaly.  CVS: RRR, S1/S2 +, no murmurs, no  gallops, no carotid bruit.  Pulmonary: Effort and breath sounds normal, no stridor, rhonchi, wheezes, rales.  Abdominal: Soft. BS +,  no distension, tenderness, rebound or guarding.  Musculoskeletal: Normal range of motion. No edema and no tenderness.  Lymphadenopathy: No lymphadenopathy noted, cervical, inguinal. Neuro: Alert. Normal reflexes, muscle tone coordination. No cranial nerve deficit. Skin: Skin is warm and dry. No rash noted. Not diaphoretic. No erythema. No pallor.  Psychiatric: Normal mood and affect. Behavior, judgment, thought content normal.   Lab Results  Component Value Date   WBC 5.3 09/19/2012   HGB 14.8 09/19/2012   HCT 42.0 09/19/2012   MCV 86.8 09/19/2012   PLT 305 09/19/2012   Lab Results  Component Value Date   CREATININE 0.75 09/19/2012   BUN 14 09/19/2012   NA 143 09/19/2012   K 4.1 09/19/2012   CL 100 09/19/2012   CO2 32 09/19/2012    Lab Results  Component Value Date   HGBA1C 5.9* 09/19/2012   Lipid Panel     Component Value Date/Time   CHOL 230* 09/19/2012 1214   TRIG 216* 09/19/2012 1214   HDL 51 09/19/2012 1214   CHOLHDL 4.5 09/19/2012 1214   VLDL 43* 09/19/2012 1214   LDLCALC 136* 09/19/2012 1214      Assessment and plan:   Patient Active Problem List   Diagnosis Date Noted  . Chest pain 01/01/2013  . Aortic insufficiency 01/01/2013  . HTN (hypertension) 09/19/2012  . HLD (hyperlipidemia) 09/19/2012  . Depression 09/19/2012  . GERD (gastroesophageal reflux disease) 09/19/2012  . Insomnia 09/19/2012  . Personal history of colonic polyps 09/19/2012   prediabetes  Referral to GI for evaluation of abdominal pain and for followup colonoscopy to evaluate polyps Reviewed ECHO with patient Continue ranitidine for acid reflux  Check labs and follow result  RTC in 4 months  The patient was given clear instructions to go to ER or return to medical center if symptoms don't improve, worsen or new problems develop.  The patient verbalized  understanding.  The patient was told to call to get lab results if they haven't heard anything in the next week.    Rodney Langton, MD, CDE, FAAFP [facility] [place], [place]

## 2013-01-16 NOTE — Patient Instructions (Signed)
Hypertension Hypertension is another name for high blood pressure. High blood pressure may mean that your heart needs to work harder to pump blood. Blood pressure consists of two numbers, which includes a higher number over a lower number (example: 110/72). HOME CARE   Make lifestyle changes as told by your doctor. This may include weight loss and exercise.  Take your blood pressure medicine every day.  Limit how much salt you use.  Stop smoking if you smoke.  Do not use drugs.  Talk to your doctor if you are using decongestants or birth control pills. These medicines might make blood pressure higher.  Females should not drink more than 1 alcoholic drink per day. Males should not drink more than 2 alcoholic drinks per day.  See your doctor as told. GET HELP RIGHT AWAY IF:   You have a blood pressure reading with a top number of 180 or higher.  You get a very bad headache.  You get blurred or changing vision.  You feel confused.  You feel weak, numb, or faint.  You get chest or belly (abdominal) pain.  You throw up (vomit).  You cannot breathe very well. MAKE SURE YOU:   Understand these instructions.  Will watch your condition.  Will get help right away if you are not doing well or get worse. Document Released: 01/09/2008 Document Revised: 10/15/2011 Document Reviewed: 01/09/2008 Exit[employer] Patient Information 2014 [employer].  

## 2013-01-16 NOTE — Progress Notes (Signed)
Patient is a follow up Had echo done- would like results Has not been able to [place] seen by GI yet

## 2013-01-22 ENCOUNTER — Telehealth: Payer: Self-pay

## 2013-01-22 NOTE — Telephone Encounter (Signed)
Left message to return our call.

## 2013-01-26 ENCOUNTER — Telehealth: Payer: Self-pay

## 2013-01-26 NOTE — Telephone Encounter (Signed)
Lab results given to daughter  Due to a language barrier

## 2013-01-28 ENCOUNTER — Encounter: Payer: Self-pay | Admitting: *Deleted

## 2013-02-25 ENCOUNTER — Ambulatory Visit: Payer: No Typology Code available for payment source | Attending: Family Medicine | Admitting: Internal Medicine

## 2013-02-25 ENCOUNTER — Encounter: Payer: Self-pay | Admitting: Internal Medicine

## 2013-02-25 VITALS — BP 146/86 | HR 66 | Temp 98.5°F | Resp 16 | Ht 60.0 in | Wt 115.4 lb

## 2013-02-25 DIAGNOSIS — R6883 Chills (without fever): Secondary | ICD-10-CM

## 2013-02-25 MED ORDER — RANITIDINE HCL 150 MG PO TABS: 75.0000 mg | ORAL_TABLET | Freq: Two times a day (BID) | ORAL | Status: DC

## 2013-02-25 MED ORDER — TRAZODONE HCL 150 MG PO TABS: 150.0000 mg | ORAL_TABLET | Freq: Every day | ORAL | Status: DC

## 2013-02-25 MED ORDER — TRIAMTERENE-HCTZ 37.5-25 MG PO CAPS: 1.0000 | ORAL_CAPSULE | Freq: Every day | ORAL | Status: DC

## 2013-02-25 MED ORDER — SIMVASTATIN 10 MG PO TABS: 10.0000 mg | ORAL_TABLET | Freq: Every day | ORAL | Status: DC

## 2013-02-25 MED ORDER — CITALOPRAM HYDROBROMIDE 40 MG PO TABS: 40.0000 mg | ORAL_TABLET | Freq: Every day | ORAL | Status: DC

## 2013-02-25 NOTE — Progress Notes (Signed)
Pt here for c/o rash that started 2 weeks ago legs/back.otc cream not working Medication refills Need Crestor changed to Simvastatin due to cost. Chills also reported x 2weeks without fever vss

## 2013-02-25 NOTE — Progress Notes (Signed)
Patient ID: Danielle Richard, female   DOB: [birth year], 62 y.o.   MRN: [id number]   HPI: 31/ y/o female here for chills and rash that started 2 wks ago. The rash which, was present on her back, has improved with a cream that she has been applying but the chills are still occuring. No fever, cough, flu like symptoms or GI complaints.   She tells me her left knee, fingers and ankle hurt- she takes Alleve and symptoms resolve.   Also wants Crestor to [place] switched to Zocor which is cheaper for her.    Allergies  Allergen Reactions  . Aspirin    Past Medical History  Diagnosis Date  . Hypertension   . HLD (hyperlipidemia)   . Depression   . Personal history of colonic polyps   . GERD (gastroesophageal reflux disease)   . Insomnia   . Aortic insufficiency    Prior to Admission medications   Medication Sig Start Date End Date Taking? Authorizing Provider  ranitidine (ZANTAC) 150 MG tablet Take 0.5 tablets (75 mg total) by mouth 2 (two) times daily. Half tablet twice daily 02/25/13  Yes Calvert Cantor, MD  traZODone (DESYREL) 150 MG tablet Take 1 tablet (150 mg total) by mouth at bedtime. 02/25/13  Yes Calvert Cantor, MD  triamterene-hydrochlorothiazide (DYAZIDE) 37.5-25 MG per capsule Take 1 each (1 capsule total) by mouth daily. 02/25/13  Yes Calvert Cantor, MD  citalopram (CELEXA) 40 MG tablet Take 1 tablet (40 mg total) by mouth daily. 02/25/13   Calvert Cantor, MD   Family History  Problem Relation Age of Onset  . CAD Mother    History   Social History  . Marital Status: Married    Spouse Name: N/A    Number of Children: 3  . Years of Education: N/A   Occupational History  . Not on file.   Social History Main Topics  . Smoking status: Never Smoker   . Smokeless tobacco: Not on file  . Alcohol Use: No  . Drug Use: No  . Sexually Active: Not on file   Other Topics Concern  . Not on file   Social History Narrative  . No narrative on file    Review of Systems ______ Per  HPI   Objective:   Filed Vitals:   02/25/13 1150  BP: 146/86  Pulse: 66  Temp: 98.5 F (36.9 C)  Resp: 16    Physical Exam ______ Constitutional: Appears well-developed and well-nourished. No distress. ____ HENT: Normocephalic. External right and left ear normal. Oropharynx is clear and moist. ____ Eyes: Conjunctivae and EOM are normal. PERRLA, no scleral icterus. ____ Neck: Normal ROM. Neck supple. No JVD. No tracheal deviation. No thyromegaly. ____ CVS: RRR, S1/S2 +, no murmurs, no gallops, no carotid bruit.  Pulmonary: Effort and breath sounds normal, no stridor, rhonchi, wheezes, rales.  Abdominal: Soft. BS +,  no distension, tenderness, rebound or guarding. ________ Musculoskeletal: Normal range of motion. No edema and no tenderness. ____ Lymphadenopathy: No lymphadenopathy noted, cervical, inguinal. Neuro: Alert. Normal reflexes, muscle tone coordination. No cranial nerve deficit. Skin: Skin is warm and dry. No rash noted. Not diaphoretic. No erythema. No pallor. ____ Psychiatric: Normal mood and affect. Behavior, judgment, thought content normal. __  Lab Results  Component Value Date   WBC 5.3 09/19/2012   HGB 14.8 09/19/2012   HCT 42.0 09/19/2012   MCV 86.8 09/19/2012   PLT 305 09/19/2012   Lab Results  Component Value Date   CREATININE 0.79  01/16/2013   BUN 10 01/16/2013   NA 142 01/16/2013   K 3.8 01/16/2013   CL 101 01/16/2013   CO2 31 01/16/2013    Lab Results  Component Value Date   HGBA1C 5.9* 09/19/2012   Lipid Panel     Component Value Date/Time   CHOL 129 01/16/2013 1258   TRIG 341* 01/16/2013 1258   HDL 35* 01/16/2013 1258   CHOLHDL 3.7 01/16/2013 1258   VLDL 68* 01/16/2013 1258   LDLCALC 26 01/16/2013 1258       Assessment and plan:   Patient Active Problem List   Diagnosis Date Noted  . Chest pain 01/01/2013  . Aortic insufficiency 01/01/2013  . HTN (hypertension) 09/19/2012  . HLD (hyperlipidemia) 09/19/2012  . Depression 09/19/2012  . GERD  (gastroesophageal reflux disease) 09/19/2012  . Insomnia 09/19/2012  . Personal history of colonic polyps 09/19/2012    Chills w/o fever: - check CBC, TSH, bmet  Arthritis: Cont Alleve as needed  Hyperlipidemia: Given prescription for Zocor 10 mg daily.      Has appt to return on 7/31 - labs can Jaydynn discussed at that time.

## 2013-02-26 LAB — BASIC METABOLIC PANEL
BUN: 13 mg/dL (ref 6–23)
CO2: 35 mEq/L — ABNORMAL HIGH (ref 19–32)
Calcium: 9.5 mg/dL (ref 8.4–10.5)
Chloride: 103 mEq/L (ref 96–112)
Creat: 0.78 mg/dL (ref 0.50–1.10)
Glucose, Bld: 143 mg/dL — ABNORMAL HIGH (ref 70–99)
Potassium: 4.3 mEq/L (ref 3.5–5.3)
Sodium: 143 mEq/L (ref 135–145)

## 2013-02-26 LAB — CBC WITH DIFFERENTIAL/PLATELET
Basophils Absolute: 0 10*3/uL (ref 0.0–0.1)
Basophils Relative: 0 % (ref 0–1)
Eosinophils Absolute: 0.4 10*3/uL (ref 0.0–0.7)
Eosinophils Relative: 8 % — ABNORMAL HIGH (ref 0–5)
HCT: 38 % (ref 36.0–46.0)
Hemoglobin: 13.2 g/dL (ref 12.0–15.0)
Lymphocytes Relative: 38 % (ref 12–46)
Lymphs Abs: 1.8 10*3/uL (ref 0.7–4.0)
MCH: 30.9 pg (ref 26.0–34.0)
MCHC: 34.7 g/dL (ref 30.0–36.0)
MCV: 89 fL (ref 78.0–100.0)
Monocytes Absolute: 0.4 10*3/uL (ref 0.1–1.0)
Monocytes Relative: 8 % (ref 3–12)
Neutro Abs: 2.2 10*3/uL (ref 1.7–7.7)
Neutrophils Relative %: 46 % (ref 43–77)
Platelets: 333 10*3/uL (ref 150–400)
RBC: 4.27 MIL/uL (ref 3.87–5.11)
RDW: 14 % (ref 11.5–15.5)
WBC: 4.9 10*3/uL (ref 4.0–10.5)

## 2013-02-26 LAB — TSH: TSH: 0.858 u[IU]/mL (ref 0.350–4.500)

## 2013-03-05 ENCOUNTER — Telehealth: Payer: Self-pay | Admitting: *Deleted

## 2013-03-05 ENCOUNTER — Ambulatory Visit: Payer: No Typology Code available for payment source | Attending: Family Medicine

## 2013-03-05 NOTE — Telephone Encounter (Signed)
03/05/13 Patient requesting lab results please review labs with recommendation. P.Masin Shatto,RN BSN [facility]

## 2013-03-24 ENCOUNTER — Telehealth: Payer: Self-pay

## 2013-03-24 NOTE — Telephone Encounter (Signed)
Health dept called Spoke with Olegario Messier Wanted to change zantac to pepcid Per Dr Hyman Hopes- changed to pepcid 20 mg bid

## 2013-04-23 ENCOUNTER — Encounter: Payer: Self-pay | Admitting: Internal Medicine

## 2013-04-23 ENCOUNTER — Ambulatory Visit: Payer: No Typology Code available for payment source | Attending: Internal Medicine | Admitting: Internal Medicine

## 2013-04-23 VITALS — BP 136/78 | HR 66 | Temp 98.6°F | Ht 60.0 in | Wt 113.8 lb

## 2013-04-23 DIAGNOSIS — Z23 Encounter for immunization: Secondary | ICD-10-CM

## 2013-04-23 DIAGNOSIS — M810 Age-related osteoporosis without current pathological fracture: Secondary | ICD-10-CM

## 2013-04-23 DIAGNOSIS — R7309 Other abnormal glucose: Secondary | ICD-10-CM

## 2013-04-23 DIAGNOSIS — R7303 Prediabetes: Secondary | ICD-10-CM

## 2013-04-23 DIAGNOSIS — I10 Essential (primary) hypertension: Secondary | ICD-10-CM

## 2013-04-23 DIAGNOSIS — E785 Hyperlipidemia, unspecified: Secondary | ICD-10-CM

## 2013-04-23 MED ORDER — TRIAMTERENE-HCTZ 37.5-25 MG PO CAPS: 1.0000 | ORAL_CAPSULE | Freq: Every day | ORAL | Status: DC

## 2013-04-23 MED ORDER — RANITIDINE HCL 150 MG PO TABS: 75.0000 mg | ORAL_TABLET | Freq: Two times a day (BID) | ORAL | Status: DC

## 2013-04-23 MED ORDER — SIMVASTATIN 10 MG PO TABS: 10.0000 mg | ORAL_TABLET | Freq: Every day | ORAL | Status: DC

## 2013-04-23 NOTE — Progress Notes (Signed)
Patient ID: Danielle Richard, female   DOB: Dec 19, 1950, 62 y.o.   MRN: [id number]   History of present illness  History and translation provided by patient's daughter 62 year old [name] female here for followup. She was seen once back in the clinic and labs ordered. She denies any headache, dizziness, chest pain, palpitation, shortness of breath, abdominal pain, nausea, vomiting, fever, chills, bowel or urinary symptoms. She does report some blurry vision with right eye for past months and reports chronic pain in her bilateral knees. Reports being compliant with her medications     Vital signs in last 24 hours:  Filed Vitals:   04/23/13 1625  BP: 136/78  Pulse: 66  Temp: 98.6 F (37 C)  TempSrc: Oral  Height: 5' (1.524 m)  Weight: 113 lb 12.8 oz (51.619 kg)  SpO2: 97%     Physical Exam:  General:  Elderly female  in no acute distress. HEENT: no pallor, no icterus, moist oral mucosa, no JVD, no lymphadenopathy Heart: Normal  s1 &s2  Regular rate and rhythm, without murmurs, rubs, gallops. Lungs: Clear to auscultation bilaterally. Abdomen: Soft, nontender, nondistended, positive bowel sounds. Extremities: No clubbing cyanosis or edema with positive pedal pulses. Neuro: Alert, awake, oriented x3, nonfocal.   Lab Results:  Basic Metabolic Panel:    Component Value Date/Time   NA 143 02/25/2013 1221   K 4.3 02/25/2013 1221   CL 103 02/25/2013 1221   CO2 35* 02/25/2013 1221   BUN 13 02/25/2013 1221   CREATININE 0.78 02/25/2013 1221   CREATININE 0.75 09/19/2012 1214   GLUCOSE 143* 02/25/2013 1221   CALCIUM 9.5 02/25/2013 1221   CBC:    Component Value Date/Time   WBC 4.9 02/25/2013 1221   HGB 13.2 02/25/2013 1221   HCT 38.0 02/25/2013 1221   PLT 333 02/25/2013 1221   MCV 89.0 02/25/2013 1221   NEUTROABS 2.2 02/25/2013 1221   LYMPHSABS 1.8 02/25/2013 1221   MONOABS 0.4 02/25/2013 1221   EOSABS 0.4 02/25/2013 1221   BASOSABS 0.0 02/25/2013 1221    No results found for this or any  previous visit (from the past 240 hour(s)).  Studies/Results: No results found.  Medications: Reviewed  Assessment/Plan:  Hypertension Blood pressure mildly elevated. I would continue her on current medication . counseled on dietary adherence.  Prediabetes Will check for hemoglobin A1c on next visit  Dyslipidemia Prescribe low-dose simvastatin  ? History of osteoporosis Order for a DEXA scan  follow up in 3 months   Kodee Ravert 04/23/2013, 5:09 PM

## 2013-04-30 ENCOUNTER — Other Ambulatory Visit: Payer: Self-pay | Admitting: Emergency Medicine

## 2013-04-30 MED ORDER — ROSUVASTATIN CALCIUM 10 MG PO TABS: 10.0000 mg | ORAL_TABLET | Freq: Every day | ORAL | Status: DC

## 2013-05-04 ENCOUNTER — Telehealth: Payer: Self-pay | Admitting: Internal Medicine

## 2013-05-04 NOTE — Telephone Encounter (Signed)
Pt left a message with number for bone density scheduling [phone]. Pt may call anytime to schedule

## 2013-05-04 NOTE — Telephone Encounter (Signed)
On 04/23/13 visit pt and pt's daughter were told that pt would Danielle Richard scheduled for bone density test.  Pt's daughter calling to say they have not yet received appt date/time for test.  Please f/u with pt's daughter, Luciano Cutter.

## 2013-05-22 ENCOUNTER — Ambulatory Visit (HOSPITAL_COMMUNITY)
Admission: RE | Admit: 2013-05-22 | Discharge: 2013-05-22 | Disposition: A | Discharge status: Home / Self Care | Payer: No Typology Code available for payment source | Source: Ambulatory Visit | Attending: Internal Medicine | Admitting: Internal Medicine

## 2013-05-22 DIAGNOSIS — M810 Age-related osteoporosis without current pathological fracture: Secondary | ICD-10-CM

## 2013-05-22 DIAGNOSIS — Z780 Asymptomatic menopausal state: Secondary | ICD-10-CM | POA: Insufficient documentation

## 2013-05-22 DIAGNOSIS — Z13820 Encounter for screening for osteoporosis: Secondary | ICD-10-CM | POA: Insufficient documentation

## 2013-05-27 ENCOUNTER — Telehealth: Payer: Self-pay

## 2013-07-13 ENCOUNTER — Ambulatory Visit: Payer: No Typology Code available for payment source | Attending: Internal Medicine

## 2013-07-16 ENCOUNTER — Ambulatory Visit: Payer: No Typology Code available for payment source | Attending: Internal Medicine

## 2013-07-16 DIAGNOSIS — R7303 Prediabetes: Secondary | ICD-10-CM

## 2013-07-16 DIAGNOSIS — E785 Hyperlipidemia, unspecified: Secondary | ICD-10-CM | POA: Insufficient documentation

## 2013-07-16 LAB — LIPID PANEL
Cholesterol: 187 mg/dL (ref 0–200)
HDL: 39 mg/dL — ABNORMAL LOW (ref >39)
LDL Cholesterol: 108 mg/dL — ABNORMAL HIGH (ref 0–99)
Total CHOL/HDL Ratio: 4.8 Ratio
Triglycerides: 201 mg/dL — ABNORMAL HIGH (ref <150)
VLDL: 40 mg/dL (ref 0–40)

## 2013-07-16 LAB — HEMOGLOBIN A1C
Hgb A1c MFr Bld: 6 % — ABNORMAL HIGH (ref <5.7)
Mean Plasma Glucose: 126 mg/dL — ABNORMAL HIGH (ref <117)

## 2013-07-23 ENCOUNTER — Ambulatory Visit: Payer: No Typology Code available for payment source | Attending: Internal Medicine

## 2013-07-23 VITALS — BP 115/73 | HR 70 | Temp 98.3°F | Resp 16 | Ht 61.0 in | Wt 114.0 lb

## 2013-07-23 DIAGNOSIS — E785 Hyperlipidemia, unspecified: Secondary | ICD-10-CM

## 2013-07-23 DIAGNOSIS — Z86010 Personal history of colonic polyps: Secondary | ICD-10-CM

## 2013-07-23 DIAGNOSIS — K219 Gastro-esophageal reflux disease without esophagitis: Secondary | ICD-10-CM

## 2013-07-23 DIAGNOSIS — I10 Essential (primary) hypertension: Secondary | ICD-10-CM

## 2013-07-23 DIAGNOSIS — M8580 Other specified disorders of bone density and structure, unspecified site: Secondary | ICD-10-CM | POA: Insufficient documentation

## 2013-07-23 DIAGNOSIS — R7303 Prediabetes: Secondary | ICD-10-CM

## 2013-07-23 DIAGNOSIS — E119 Type 2 diabetes mellitus without complications: Secondary | ICD-10-CM | POA: Insufficient documentation

## 2013-07-23 MED ORDER — ATORVASTATIN CALCIUM 40 MG PO TABS: 40.0000 mg | ORAL_TABLET | Freq: Every day | ORAL | Status: DC

## 2013-07-23 NOTE — Assessment & Plan Note (Deleted)
Recheck a1c in 6 mos.

## 2013-07-23 NOTE — Assessment & Plan Note (Deleted)
Cont current therapy.

## 2013-07-23 NOTE — Progress Notes (Unsigned)
Subjective:     Patient ID: Danielle Richard, female   DOB: February 06, 1951, 62 y.o.   MRN: [id number]  HPI Hyperlipidemia: Patient presents with hyperlipidemia.  She was tested because h/o hyperlipidemia.  Her last labs showed profile not at goal, esp given pre-DM. negative. There is a family history of hyperlipidemia. There is not a family history of early ischemia heart disease. She has been on Crestor recently however this gives her myalgias. In the past she took Lipitor 40 mg per day. She would like to switch back to Lipitor however she feels that generic does not work as well and we'll give her myalgias where as brain and Lipitor will Danielle Richard better.  Hypertension: Patient here for follow-up of elevated blood pressure. She is not exercising and is adherent to low salt diet.  Blood pressure is well controlled at home. Cardiac symptoms none. Patient denies none.  Cardiovascular risk factors: none. Use of agents associated with hypertension: none. History of target organ damage: none. On dyazide.   Pre-DM - pt aware, a1c still in prediabetic range. Denies polyuria or polydipsia.  GERD: [name] complains of heartburn. This has been associated with no other symptoms.  She denies no other symptoms. Symptoms have been present for a few years. She denies dysphagia.  She has not lost weight. She denies melena, hematochezia, hematemesis, and coffee ground emesis. Medical therapy in the past has included zantac, which works well for her. she does not have gerd sx if she takes it. .  H/o colon polyps - patient says that 3 years ago when she was in the non-she had colonoscopy and endoscopy. At that time she was told to have a colonoscopy in 6 months as followup due to multiple polyps. She has not seen GI since then.   Osteopenia - patient had a DEXA scan recently. It showed osteopenia. She does not have other risk factors that would make her a candidate for bisphosphonates or other pharmacological treatments. She does not  currently take calcium or vitamin D supplements.  Review of Systems will review of systems negative except for as per history of present illness     Objective:   Physical Exam Nursing note and vitals reviewed. Constitutional: She is oriented to person, place, and time. She appears well-developed and well-nourished.  HENT:   Mouth/Throat: Oropharynx is clear and moist. No oropharyngeal exudate.  Eyes: Conjunctivae are normal. Pupils are equal, round, and reactive to light.  Neck: Normal range of motion. Neck supple. No thyromegaly present.  Cardiovascular: Normal rate, regular rhythm and normal heart sounds.   Pulmonary/Chest: Effort normal and breath sounds normal.  Abdominal: Soft. Bowel sounds are normal. She exhibits no distension. There is no tenderness. There is no rebound.  Lymphadenopathy:    She has no cervical adenopathy.  Neurological: She is alert and oriented to person, place, and time. She has normal reflexes.  Skin: Skin is warm and dry.  Psychiatric: She has a normal mood and affect. Her behavior is normal.      Assessment:     See below    Plan:     Danielle Richard was seen today for follow-up, hypertension and medication refill.  Diagnoses and associated orders for this visit:  Prediabetes continue to avoid sugary foods continue to exercise and we will recheck the A1c in about 6 months. She declines diabetes education. If she changes her mind she can let us know and at the place that referral.  HTN (hypertension) At goal. continue her Dyazide  let us know if she gets dizzy headaches blurry vision or any other symptoms that are concerning.  HLD (hyperlipidemia) - atorvastatin (LIPITOR) 40 MG tablet; Take 1 tablet (40 mg total) by mouth daily. She decided to try the Lipitor her knowing that it is generic.  Osteopenia - Vit D  25 hydroxy (rtn osteoporosis monitoring). Check her vitamin D today and supplement with high-dose vitamin D if necessary. She will start taking  over-the-counter calcium supplementation with vitamin D daily. We'll plan to repeat her DEXA scan in a couple of years.  GERD (gastroesophageal reflux disease) She will continue to take her Zantac and let us know if symptoms worsen.  Personal history of colonic polyps Post referral to GI and hopefully she can get in to discuss the history of polyps and  We'll plan to see this patient back in approximately 6 months. Will see her earlier if there is any need.

## 2013-07-23 NOTE — Assessment & Plan Note (Deleted)
Continue Zantac. Let us know if she has increase in reflux symptoms.

## 2013-07-23 NOTE — Assessment & Plan Note (Deleted)
We'll plan to stop the Crestor. We'll speak to the pharmacy to see if we're able to get Lipitor. If were not able to get Lipitor at 10 weeks and we'll get another option. We'll plan to recheck her cholesterol profile in approximately 6 months.

## 2013-07-23 NOTE — Patient Instructions (Signed)
Calcium Intake Recommendations Calcium in our blood is important for the control of many things, such as:  Blood clotting.  Conducting of nerve impulses.  Muscle contraction.  Maintaining teeth and bone health.  Other body functions. Age group / Amount of calcium to consume daily, in milligrams (mg)  Birth to 6 months / 200 mg  Infants 7 to 12 months / 260 mg  Children 1 to 3 years / 700 mg  Children 4 to 8 years / 1,000 mg  Children 9 to 13 years / 1,300 mg  Teens 14 to 18 years / 1,300 mg  Adults 19 to 50 years / 1,000 mg  Adult women 51 to 70 years / 1,200 mg  Adults 71 years and older / 1,200 mg  Pregnant and breastfeeding teens / 1,300 mg  Pregnant and breastfeeding adults / 1,000 mg Document Released: 03/06/2004 Document Revised: 11/17/2012 Document Reviewed: 07/23/2005 [employer] Patient Information 2014 [employer].   

## 2013-07-24 LAB — VITAMIN D 25 HYDROXY (VIT D DEFICIENCY, FRACTURES): Vit D, 25-Hydroxy: 48 ng/mL (ref 30–89)

## 2013-08-06 DIAGNOSIS — Z9889 Other specified postprocedural states: Secondary | ICD-10-CM

## 2013-08-06 HISTORY — DX: Other specified postprocedural states: Z98.890

## 2013-08-13 ENCOUNTER — Encounter: Payer: Self-pay | Admitting: Internal Medicine

## 2013-08-13 ENCOUNTER — Ambulatory Visit: Payer: No Typology Code available for payment source | Attending: Internal Medicine | Admitting: Internal Medicine

## 2013-08-13 VITALS — BP 131/82 | HR 99 | Temp 98.3°F | Resp 16

## 2013-08-13 DIAGNOSIS — R059 Cough, unspecified: Secondary | ICD-10-CM

## 2013-08-13 DIAGNOSIS — J3489 Other specified disorders of nose and nasal sinuses: Secondary | ICD-10-CM | POA: Insufficient documentation

## 2013-08-13 DIAGNOSIS — H04129 Dry eye syndrome of unspecified lacrimal gland: Secondary | ICD-10-CM

## 2013-08-13 DIAGNOSIS — H04123 Dry eye syndrome of bilateral lacrimal glands: Secondary | ICD-10-CM

## 2013-08-13 DIAGNOSIS — R0981 Nasal congestion: Secondary | ICD-10-CM

## 2013-08-13 DIAGNOSIS — H9209 Otalgia, unspecified ear: Secondary | ICD-10-CM

## 2013-08-13 DIAGNOSIS — H9201 Otalgia, right ear: Secondary | ICD-10-CM

## 2013-08-13 DIAGNOSIS — J029 Acute pharyngitis, unspecified: Secondary | ICD-10-CM | POA: Insufficient documentation

## 2013-08-13 DIAGNOSIS — R05 Cough: Secondary | ICD-10-CM

## 2013-08-13 LAB — POCT RAPID STREP A (OFFICE): Rapid Strep A Screen: NEGATIVE

## 2013-08-13 MED ORDER — FLUTICASONE PROPIONATE 50 MCG/ACT NA SUSP: 2.0000 | Freq: Every day | NASAL | Status: DC

## 2013-08-13 MED ORDER — BENZONATATE 100 MG PO CAPS: 100.0000 mg | ORAL_CAPSULE | Freq: Three times a day (TID) | ORAL | Status: DC | PRN

## 2013-08-13 MED ORDER — SULFAMETHOXAZOLE-TMP DS 800-160 MG PO TABS
1.0000 | ORAL_TABLET | Freq: Two times a day (BID) | ORAL | Status: DC
Start: 2013-08-13 — End: 2013-08-18

## 2013-08-13 MED ORDER — NAPHAZOLINE HCL 0.1 % OP SOLN: 1.0000 [drp] | Freq: Four times a day (QID) | OPHTHALMIC | Status: DC | PRN

## 2013-08-13 NOTE — Progress Notes (Signed)
Patient ID: Danielle Richard, female   DOB: [birth year], 63 y.o.   MRN: [id number] MRN: [id number] Name: Zabria Katena Petitjean  Sex: female Age: 63 y.o. DOB: 11/09/1950  Allergies: Aspirin  Chief Complaint  Patient presents with  . Cough  . Sore Throat    HPI: Patient is 63 y.o. female who  comes today with the symptoms of  headache fever chills myalgia stuffy nose productive cough, nasal congestion postnasal drip sore throat bilateral ear pain R>Lfor the last 3-4 days, she denies any chest pain or shortness of breath, she tried over-the-counter medication without much improvement, she denies any nausea vomiting or any bowel or bladder symptoms. Patient does not smoke cigarettes. Patient also reported to have dry eyes in the morning   Past Medical History  Diagnosis Date  . Hypertension   . HLD (hyperlipidemia)   . Depression   . Personal history of colonic polyps   . GERD (gastroesophageal reflux disease)   . Insomnia   . Aortic insufficiency     History reviewed. No pertinent past surgical history.    Medication List       This list is accurate as of: 08/13/13 11:48 AM.  Always use your most recent med list.               atorvastatin 40 MG tablet  Commonly known as:  LIPITOR  Take 1 tablet (40 mg total) by mouth daily.     benzonatate 100 MG capsule  Commonly known as:  TESSALON  Take 1 capsule (100 mg total) by mouth 3 (three) times daily as needed for cough.     citalopram 40 MG tablet  Commonly known as:  CELEXA  Take 1 tablet (40 mg total) by mouth daily.     fluticasone 50 MCG/ACT nasal spray  Commonly known as:  FLONASE  Place 2 sprays into both nostrils daily.     naphazoline 0.1 % ophthalmic solution  Commonly known as:  NAPHCON  Place 1 drop into both eyes 4 (four) times daily as needed for irritation.     ranitidine 150 MG tablet  Commonly known as:  ZANTAC  Take 0.5 tablets (75 mg total) by mouth 2 (two) times daily. Half tablet twice daily     sulfamethoxazole-trimethoprim 800-160 MG per tablet  Commonly known as:  BACTRIM DS  Take 1 tablet by mouth 2 (two) times daily.     traZODone 150 MG tablet  Commonly known as:  DESYREL  Take 1 tablet (150 mg total) by mouth at bedtime.     triamterene-hydrochlorothiazide 37.5-25 MG per capsule  Commonly known as:  DYAZIDE  Take 1 each (1 capsule total) by mouth daily.        Meds ordered this encounter  Medications  . fluticasone (FLONASE) 50 MCG/ACT nasal spray    Sig: Place 2 sprays into both nostrils daily.    Dispense:  16 g    Refill:  0  . sulfamethoxazole-trimethoprim (BACTRIM DS) 800-160 MG per tablet    Sig: Take 1 tablet by mouth 2 (two) times daily.    Dispense:  14 tablet    Refill:  0  . benzonatate (TESSALON) 100 MG capsule    Sig: Take 1 capsule (100 mg total) by mouth 3 (three) times daily as needed for cough.    Dispense:  30 capsule    Refill:  1  . naphazoline (NAPHCON) 0.1 % ophthalmic solution    Sig: Place 1 drop into both eyes 4 (  four) times daily as needed for irritation.    Dispense:  15 mL    Refill:  0    Immunization History  Administered Date(s) Administered  . Influenza,inj,Quad PF,36+ Mos 04/23/2013    Family History  Problem Relation Age of Onset  . CAD Mother     History  Substance Use Topics  . Smoking status: Never Smoker   . Smokeless tobacco: Not on file  . Alcohol Use: No    Review of Systems  As noted in HPI  Filed Vitals:   08/13/13 1120  BP: 131/82  Pulse: 99  Temp: 98.3 F (36.8 C)  Resp: 16    Physical Exam  Physical Exam  HENT:  Nasal congestion minimal sinus tenderness pharyngeal erythema no exudate, bilateral TMs visualized right congested  Eyes: EOM are normal. Pupils are equal, round, and reactive to light.  Cardiovascular: Normal rate and regular rhythm.   Pulmonary/Chest: Breath sounds normal. No respiratory distress. She has no wheezes. She has no rales.    CBC    Component Value Date/Time    WBC 4.9 02/25/2013 1221   RBC 4.27 02/25/2013 1221   HGB 13.2 02/25/2013 1221   HCT 38.0 02/25/2013 1221   PLT 333 02/25/2013 1221   MCV 89.0 02/25/2013 1221   LYMPHSABS 1.8 02/25/2013 1221   MONOABS 0.4 02/25/2013 1221   EOSABS 0.4 02/25/2013 1221   BASOSABS 0.0 02/25/2013 1221    CMP     Component Value Date/Time   NA 143 02/25/2013 1221   K 4.3 02/25/2013 1221   CL 103 02/25/2013 1221   CO2 35* 02/25/2013 1221   GLUCOSE 143* 02/25/2013 1221   BUN 13 02/25/2013 1221   CREATININE 0.78 02/25/2013 1221   CREATININE 0.75 09/19/2012 1214   CALCIUM 9.5 02/25/2013 1221   PROT 7.0 01/16/2013 1258   ALBUMIN 4.7 01/16/2013 1258   AST 24 01/16/2013 1258   ALT 26 01/16/2013 1258   ALKPHOS 58 01/16/2013 1258   BILITOT 0.3 01/16/2013 1258   GFRNONAA >90 09/19/2012 1214   GFRAA >90 09/19/2012 1214    Lab Results  Component Value Date/Time   CHOL 187 07/16/2013  9:17 AM    No components found with this basename: hga1c    Lab Results  Component Value Date/Time   AST 24 01/16/2013 12:58 PM    Assessment and Plan  Sore throat - Plan: Rapid Strep A is negative, will send,Throat culture Loney Loh([facility]), since patient is symptomatic will treat her with sulfamethoxazole-trimethoprim (BACTRIM DS) 800-160 MG per tablet  Nasal congestion - Plan: fluticasone (FLONASE) 50 MCG/ACT nasal spray  Pharyngitis - Plan: sulfamethoxazole-trimethoprim (BACTRIM DS) 800-160 MG per tablet  Otalgia of right ear... aleve  when necessary  Cough - Plan: benzonatate (TESSALON) 100 MG capsule  Dry eye, bilateral - Plan: naphazoline (NAPHCON) 0.1 % ophthalmic solution  Advised patient for saltwater gargles   Return if symptoms worsen or fail to improve.  Doris CheadleADVANI, Merl Bommarito, MD

## 2013-08-13 NOTE — Progress Notes (Signed)
Patient here with interpreter States has been having sore throat Cough burning nose And flu like symptoms for past three days

## 2013-08-14 ENCOUNTER — Ambulatory Visit: Payer: No Typology Code available for payment source | Admitting: Internal Medicine

## 2013-08-15 LAB — CULTURE, GROUP A STREP: Organism ID, Bacteria: NORMAL

## 2013-08-18 ENCOUNTER — Encounter: Payer: Self-pay | Admitting: Internal Medicine

## 2013-08-18 ENCOUNTER — Ambulatory Visit: Payer: No Typology Code available for payment source | Attending: Internal Medicine | Admitting: Internal Medicine

## 2013-08-18 VITALS — BP 112/78 | HR 87 | Temp 98.7°F | Resp 14 | Ht 62.0 in | Wt 111.0 lb

## 2013-08-18 DIAGNOSIS — J209 Acute bronchitis, unspecified: Secondary | ICD-10-CM

## 2013-08-18 LAB — COMPLETE METABOLIC PANEL WITH GFR
ALT: 15 U/L (ref 0–35)
AST: 19 U/L (ref 0–37)
Albumin: 4.3 g/dL (ref 3.5–5.2)
Alkaline Phosphatase: 61 U/L (ref 39–117)
BUN: 16 mg/dL (ref 6–23)
CO2: 30 mEq/L (ref 19–32)
Calcium: 9.3 mg/dL (ref 8.4–10.5)
Chloride: 99 mEq/L (ref 96–112)
Creat: 1.05 mg/dL (ref 0.50–1.10)
GFR, Est African American: 66 mL/min
GFR, Est Non African American: 57 mL/min — ABNORMAL LOW
Glucose, Bld: 97 mg/dL (ref 70–99)
Potassium: 4.2 mEq/L (ref 3.5–5.3)
Sodium: 141 mEq/L (ref 135–145)
Total Bilirubin: 0.5 mg/dL (ref 0.3–1.2)
Total Protein: 7.4 g/dL (ref 6.0–8.3)

## 2013-08-18 LAB — CBC WITH DIFFERENTIAL/PLATELET
Basophils Absolute: 0 10*3/uL (ref 0.0–0.1)
Basophils Relative: 0 % (ref 0–1)
Eosinophils Absolute: 0 10*3/uL (ref 0.0–0.7)
Eosinophils Relative: 0 % (ref 0–5)
HCT: 39 % (ref 36.0–46.0)
Hemoglobin: 13.6 g/dL (ref 12.0–15.0)
Lymphocytes Relative: 23 % (ref 12–46)
Lymphs Abs: 1.9 10*3/uL (ref 0.7–4.0)
MCH: 30.2 pg (ref 26.0–34.0)
MCHC: 34.9 g/dL (ref 30.0–36.0)
MCV: 86.5 fL (ref 78.0–100.0)
Monocytes Absolute: 0.3 10*3/uL (ref 0.1–1.0)
Monocytes Relative: 3 % (ref 3–12)
Neutro Abs: 6.3 10*3/uL (ref 1.7–7.7)
Neutrophils Relative %: 74 % (ref 43–77)
Platelets: 453 10*3/uL — ABNORMAL HIGH (ref 150–400)
RBC: 4.51 MIL/uL (ref 3.87–5.11)
RDW: 14.1 % (ref 11.5–15.5)
WBC: 8.6 10*3/uL (ref 4.0–10.5)

## 2013-08-18 MED ORDER — DIPHENHYDRAMINE HCL 25 MG PO TABS: 25.0000 mg | ORAL_TABLET | Freq: Four times a day (QID) | ORAL | Status: DC | PRN

## 2013-08-18 MED ORDER — LEVOFLOXACIN 500 MG PO TABS
500.0000 mg | ORAL_TABLET | Freq: Every day | ORAL | Status: DC
Start: 2013-08-18 — End: 2013-09-15

## 2013-08-18 NOTE — Progress Notes (Signed)
Pt is here for a f/u. Complains of cold and flu like symptoms x1 week; no fever, nausea, no vomiting, abdominal discomfort, and also top back pain x2 years. Taking an OTC medication. Pt has an interpreter.

## 2013-08-18 NOTE — Progress Notes (Signed)
Patient ID: Danielle Richard, female   DOB: Oct 02, 1950, 63 y.o.   MRN: [id number]   CC:  HPI: 63 y.o. female who comes today with the symptoms of headache fever chills myalgia stuffy nose productive cough, nasal congestion postnasal drip sore throat bilateral ear pain R>Lfor the last 3-4 days, she denies any chest pain or shortness of breath, she was prescribed Bactrim, Tessalon pearls on the eighth. She continues to have fever. She also developed a rash on her arms after starting the antibiotic prescription    Allergies  Allergen Reactions  . Aspirin    Past Medical History  Diagnosis Date  . Hypertension   . HLD (hyperlipidemia)   . Depression   . Personal history of colonic polyps   . GERD (gastroesophageal reflux disease)   . Insomnia   . Aortic insufficiency    Current Outpatient Prescriptions on File Prior to Visit  Medication Sig Dispense Refill  . atorvastatin (LIPITOR) 40 MG tablet Take 1 tablet (40 mg total) by mouth daily.  90 tablet  3  . benzonatate (TESSALON) 100 MG capsule Take 1 capsule (100 mg total) by mouth 3 (three) times daily as needed for cough.  30 capsule  1  . citalopram (CELEXA) 40 MG tablet Take 1 tablet (40 mg total) by mouth daily.  30 tablet  4  . fluticasone (FLONASE) 50 MCG/ACT nasal spray Place 2 sprays into both nostrils daily.  16 g  0  . naphazoline (NAPHCON) 0.1 % ophthalmic solution Place 1 drop into both eyes 4 (four) times daily as needed for irritation.  15 mL  0  . ranitidine (ZANTAC) 150 MG tablet Take 0.5 tablets (75 mg total) by mouth 2 (two) times daily. Half tablet twice daily  60 tablet  2  . traZODone (DESYREL) 150 MG tablet Take 1 tablet (150 mg total) by mouth at bedtime.  30 tablet  3  . triamterene-hydrochlorothiazide (DYAZIDE) 37.5-25 MG per capsule Take 1 each (1 capsule total) by mouth daily.  30 capsule  3  . [DISCONTINUED] zolpidem (AMBIEN) 10 MG tablet Take 1 tablet (10 mg total) by mouth at bedtime as needed for sleep.  30 tablet   0   No current facility-administered medications on file prior to visit.   Family History  Problem Relation Age of Onset  . CAD Mother    History   Social History  . Marital Status: Married    Spouse Name: N/A    Number of Children: 3  . Years of Education: N/A   Occupational History  . Not on file.   Social History Main Topics  . Smoking status: Never Smoker   . Smokeless tobacco: Not on file  . Alcohol Use: No  . Drug Use: No  . Sexual Activity: Not on file   Other Topics Concern  . Not on file   Social History Narrative  . No narrative on file    Review of Systems  Constitutional: As in history of present illness HENT: Negative for ear pain, nosebleeds, congestion, facial swelling, rhinorrhea, neck pain, neck stiffness and ear discharge.   Eyes: Negative for pain, discharge, redness, itching and visual disturbance.  Respiratory: Negative for cough, choking, chest tightness, shortness of breath, wheezing and stridor.   Cardiovascular: Negative for chest pain, palpitations and leg swelling.  Gastrointestinal: Negative for abdominal distention.  Genitourinary: Negative for dysuria, urgency, frequency, hematuria, flank pain, decreased urine volume, difficulty urinating and dyspareunia.  Musculoskeletal: Negative for back pain, joint swelling,  arthralgias and gait problem.  Neurological: Negative for dizziness, tremors, seizures, syncope, facial asymmetry, speech difficulty, weakness, light-headedness, numbness and headaches.  Hematological: Negative for adenopathy. Does not bruise/bleed easily.  Psychiatric/Behavioral: Negative for hallucinations, behavioral problems, confusion, dysphoric mood, decreased concentration and agitation.    Objective:   Filed Vitals:   08/18/13 1402  BP: 112/78  Pulse: 87  Temp: 98.7 F (37.1 C)  Resp: 14    Physical Exam  Constitutional: Appears well-developed and well-nourished. No distress.  HENT: Normocephalic. External  right and left ear normal. Oropharynx is clear and moist.  Eyes: Conjunctivae and EOM are normal. PERRLA, no scleral icterus.  Neck: Normal ROM. Neck supple. No JVD. No tracheal deviation. No thyromegaly.  CVS: RRR, S1/S2 +, no murmurs, no gallops, no carotid bruit.  Pulmonary: Effort and breath sounds normal, no stridor, rhonchi, wheezes, rales.  Abdominal: Soft. BS +,  no distension, tenderness, rebound or guarding.  Musculoskeletal: Normal range of motion. No edema and no tenderness.  Lymphadenopathy: No lymphadenopathy noted, cervical, inguinal. Neuro: Alert. Normal reflexes, muscle tone coordination. No cranial nerve deficit. Skin: Skin is warm and dry. No rash noted. Not diaphoretic. No erythema. No pallor.  Psychiatric: Normal mood and affect. Behavior, judgment, thought content normal.   Lab Results  Component Value Date   WBC 4.9 02/25/2013   HGB 13.2 02/25/2013   HCT 38.0 02/25/2013   MCV 89.0 02/25/2013   PLT 333 02/25/2013   Lab Results  Component Value Date   CREATININE 0.78 02/25/2013   BUN 13 02/25/2013   NA 143 02/25/2013   K 4.3 02/25/2013   CL 103 02/25/2013   CO2 35* 02/25/2013    Lab Results  Component Value Date   HGBA1C 6.0* 07/16/2013   Lipid Panel     Component Value Date/Time   CHOL 187 07/16/2013 0917   TRIG 201* 07/16/2013 0917   HDL 39* 07/16/2013 0917   CHOLHDL 4.8 07/16/2013 0917   VLDL 40 07/16/2013 0917   LDLCALC 108* 07/16/2013 0917       Assessment and plan:   Patient Active Problem List   Diagnosis Date Noted  . Prediabetes 07/23/2013  . Osteopenia 07/23/2013  . Chest pain 01/01/2013  . Aortic insufficiency 01/01/2013  . HTN (hypertension) 09/19/2012  . HLD (hyperlipidemia) 09/19/2012  . Depression 09/19/2012  . GERD (gastroesophageal reflux disease) 09/19/2012  . Insomnia 09/19/2012  . Personal history of colonic polyps 09/19/2012       Acute bronchitis Discontinue Bactrim Prescribed Levaquin for 7 days Continue Flonase,  Tessalon Benadryl for itching    The patient was given clear instructions to go to ER or return to medical center if symptoms don't improve, worsen or new problems develop. The patient verbalized understanding. The patient was told to call to get any lab results if not heard anything in the next week.

## 2013-08-24 ENCOUNTER — Ambulatory Visit: Payer: No Typology Code available for payment source

## 2013-08-24 ENCOUNTER — Telehealth: Payer: Self-pay | Admitting: *Deleted

## 2013-08-24 NOTE — Telephone Encounter (Signed)
Contacted pt with the interpreter line. 

## 2013-08-24 NOTE — Telephone Encounter (Signed)
Message copied by Jervis Trapani, [place] on Mon Aug 24, 2013 10:34 AM ------      Message from: Susie CassetteABROL MD, [facility]      Created: Fri Aug 21, 2013 12:30 PM       Notify patient that labs are normal ------

## 2013-08-27 ENCOUNTER — Telehealth: Payer: Self-pay | Admitting: Family Medicine

## 2013-08-27 NOTE — Telephone Encounter (Signed)
Pt is calling to get a referral order changed because the referral location does not accept the O.C; pt would like to know if she can get the referral changed without coming in for a visit; please call pt daughter @ [phone]

## 2013-08-28 NOTE — Telephone Encounter (Signed)
Patient daughter not available Left message on voice mail to return our call

## 2013-09-02 ENCOUNTER — Other Ambulatory Visit: Payer: Self-pay | Admitting: Gastroenterology

## 2013-09-09 ENCOUNTER — Ambulatory Visit: Payer: No Typology Code available for payment source | Attending: Internal Medicine | Admitting: Internal Medicine

## 2013-09-09 VITALS — BP 118/80 | HR 67 | Temp 98.9°F | Resp 14 | Ht 62.0 in | Wt 110.6 lb

## 2013-09-09 DIAGNOSIS — R0981 Nasal congestion: Secondary | ICD-10-CM

## 2013-09-09 DIAGNOSIS — J019 Acute sinusitis, unspecified: Secondary | ICD-10-CM | POA: Insufficient documentation

## 2013-09-09 DIAGNOSIS — J111 Influenza due to unidentified influenza virus with other respiratory manifestations: Secondary | ICD-10-CM | POA: Insufficient documentation

## 2013-09-09 DIAGNOSIS — J209 Acute bronchitis, unspecified: Secondary | ICD-10-CM | POA: Insufficient documentation

## 2013-09-09 DIAGNOSIS — J3489 Other specified disorders of nose and nasal sinuses: Secondary | ICD-10-CM

## 2013-09-09 LAB — CBC WITH DIFFERENTIAL/PLATELET
Basophils Absolute: 0 10*3/uL (ref 0.0–0.1)
Basophils Relative: 1 % (ref 0–1)
Eosinophils Absolute: 0 10*3/uL (ref 0.0–0.7)
Eosinophils Relative: 1 % (ref 0–5)
HCT: 37.9 % (ref 36.0–46.0)
Hemoglobin: 13 g/dL (ref 12.0–15.0)
Lymphocytes Relative: 30 % (ref 12–46)
Lymphs Abs: 1.2 10*3/uL (ref 0.7–4.0)
MCH: 30.3 pg (ref 26.0–34.0)
MCHC: 34.3 g/dL (ref 30.0–36.0)
MCV: 88.3 fL (ref 78.0–100.0)
Monocytes Absolute: 0.5 10*3/uL (ref 0.1–1.0)
Monocytes Relative: 11 % (ref 3–12)
Neutro Abs: 2.4 10*3/uL (ref 1.7–7.7)
Neutrophils Relative %: 57 % (ref 43–77)
Platelets: 281 10*3/uL (ref 150–400)
RBC: 4.29 MIL/uL (ref 3.87–5.11)
RDW: 14.4 % (ref 11.5–15.5)
WBC: 4.1 10*3/uL (ref 4.0–10.5)

## 2013-09-09 MED ORDER — LORATADINE 10 MG PO TABS: 10.0000 mg | ORAL_TABLET | Freq: Every day | ORAL | Status: DC

## 2013-09-09 MED ORDER — GUAIFENESIN-CODEINE 100-10 MG/5ML PO SOLN: 5.0000 mL | ORAL | Status: DC | PRN

## 2013-09-09 MED ORDER — FLUTICASONE PROPIONATE 50 MCG/ACT NA SUSP: 2.0000 | Freq: Every day | NASAL | Status: DC

## 2013-09-09 NOTE — Progress Notes (Signed)
Pt is here for a f/u. Complains of still having cold symptoms x3 days; dry cough, nasal congestion, eye pain, runny nose. Pt has an interpreter.

## 2013-09-09 NOTE — Progress Notes (Signed)
Patient ID: Danielle Richard, female   DOB: [birth year], 63 y.o.   MRN: [id number]   CC:  HPI:  [age] year old female here with a chief complaint of having flulike symptoms. The patient has been seen twice before on 1/81/13. Initially prescribed Bactrim, to which he had a reaction and was switched to Levaquin for 62 days. While the patient is Levaquin 62 the patient was fine. After stopping the Levaquin 63 her symptoms returned and now she complains of headache, nasal congestion, cough, which is nonproductive. She does not know if she's had a fever at home She denies any chest pain or shortness of breath.  Allergies  Allergen Reactions  . Aspirin    Past Medical History  Diagnosis Date  . Hypertension   . HLD (hyperlipidemia)   . Depression   . Personal history of colonic polyps   . GERD (gastroesophageal reflux disease)   . Insomnia   . Aortic insufficiency    Current Outpatient Prescriptions on File Prior to Visit  Medication Sig Dispense Refill  . atorvastatin (LIPITOR) 40 MG tablet Take 1 tablet (40 mg total) by mouth daily.  90 tablet  3  . benzonatate (TESSALON) 100 MG capsule Take 1 capsule (100 mg total) by mouth 3 (three) times daily as needed for cough.  30 capsule  1  . citalopram (CELEXA) 40 MG tablet Take 1 tablet (40 mg total) by mouth daily.  30 tablet  4  . diphenhydrAMINE (BENADRYL) 25 MG tablet Take 1 tablet (25 mg total) by mouth every 6 (six) hours as needed.  30 tablet  0  . diphenhydramine-acetaminophen (TYLENOL PM) 25-500 MG TABS Take 1 tablet by mouth at bedtime as needed.      Marland Kitchen. levofloxacin (LEVAQUIN) 500 MG tablet Take 1 tablet (500 mg total) by mouth daily.  7 tablet  0  . naphazoline (NAPHCON) 0.1 % ophthalmic solution Place 1 drop into both eyes 4 (four) times daily as needed for irritation.  15 mL  0  . ranitidine (ZANTAC) 150 MG tablet Take 0.5 tablets (75 mg total) by mouth 2 (two) times daily. Half tablet twice daily  60 tablet  2  . traZODone (DESYREL) 150 MG tablet  Take 1 tablet (150 mg total) by mouth at bedtime.  30 tablet  3  . triamterene-hydrochlorothiazide (DYAZIDE) 37.5-25 MG per capsule Take 1 each (1 capsule total) by mouth daily.  30 capsule  3  . [DISCONTINUED] zolpidem (AMBIEN) 10 MG tablet Take 1 tablet (10 mg total) by mouth at bedtime as needed for sleep.  30 tablet  0   No current facility-administered medications on file prior to visit.   Family History  Problem Relation Age of Onset  . CAD Mother    History   Social History  . Marital Status: Married    Spouse Name: N/A    Number of Children: 3  . Years of Education: N/A   Occupational History  . Not on file.   Social History Main Topics  . Smoking status: Never Smoker   . Smokeless tobacco: Not on file  . Alcohol Use: No  . Drug Use: No  . Sexual Activity: Not on file   Other Topics Concern  . Not on file   Social History Narrative  . No narrative on file    Review of Systems  Constitutional: As in history of present illness  HENT: Negative for ear pain, nosebleeds, congestion, facial swelling, rhinorrhea, neck pain, neck stiffness and ear discharge.   Eyes:  Negative for pain, discharge, redness, itching and visual disturbance.  Respiratory: As in history of present illness Cardiovascular: Negative for chest pain, palpitations and leg swelling.  Gastrointestinal: Negative for abdominal distention.  Genitourinary: Negative for dysuria, urgency, frequency, hematuria, flank pain, decreased urine volume, difficulty urinating and dyspareunia.  Musculoskeletal: Negative for back pain, joint swelling, arthralgias and gait problem.  Neurological: Negative for dizziness, tremors, seizures, syncope, facial asymmetry, speech difficulty, weakness, light-headedness, numbness and headaches.  Hematological: Negative for adenopathy. Does not bruise/bleed easily.  Psychiatric/Behavioral: Negative for hallucinations, behavioral problems, confusion, dysphoric mood, decreased  concentration and agitation.    Objective:   Filed Vitals:   09/09/13 1031  BP: 118/80  Pulse: 67  Temp: 98.9 F (37.2 C)  Resp: 14    Physical Exam  Constitutional: Appears well-developed and well-nourished. No distress.  HENT: Normocephalic. External right and left ear normal. Oropharynx is clear and moist.  Eyes: Conjunctivae and EOM are normal. PERRLA, no scleral icterus.  Neck: Normal ROM. Neck supple. No JVD. No tracheal deviation. No thyromegaly.  CVS: RRR, S1/S2 +, no murmurs, no gallops, no carotid bruit.  Pulmonary: Effort and breath sounds normal, no stridor, rhonchi, wheezes, rales.  Abdominal: Soft. BS +,  no distension, tenderness, rebound or guarding.  Musculoskeletal: Normal range of motion. No edema and no tenderness.  Lymphadenopathy: No lymphadenopathy noted, cervical, inguinal. Neuro: Alert. Normal reflexes, muscle tone coordination. No cranial nerve deficit. Skin: Skin is warm and dry. No rash noted. Not diaphoretic. No erythema. No pallor.  Psychiatric: Normal mood and affect. Behavior, judgment, thought content normal.   Lab Results  Component Value Date   WBC 8.6 08/18/2013   HGB 13.6 08/18/2013   HCT 39.0 08/18/2013   MCV 86.5 08/18/2013   PLT 453* 08/18/2013   Lab Results  Component Value Date   CREATININE 1.05 08/18/2013   BUN 16 08/18/2013   NA 141 08/18/2013   K 4.2 08/18/2013   CL 99 08/18/2013   CO2 30 08/18/2013    Lab Results  Component Value Date   HGBA1C 6.0* 07/16/2013   Lipid Panel     Component Value Date/Time   CHOL 187 07/16/2013 0917   TRIG 201* 07/16/2013 0917   HDL 39* 07/16/2013 0917   CHOLHDL 4.8 07/16/2013 0917   VLDL 40 07/16/2013 0917   LDLCALC 108* 07/16/2013 0917       Assessment and plan:   Patient Active Problem List   Diagnosis Date Noted  . Prediabetes 07/23/2013  . Osteopenia 07/23/2013  . Chest pain 01/01/2013  . Aortic insufficiency 01/01/2013  . HTN (hypertension) 09/19/2012  . HLD (hyperlipidemia)  09/19/2012  . Depression 09/19/2012  . GERD (gastroesophageal reflux disease) 09/19/2012  . Insomnia 09/19/2012  . Personal history of colonic polyps 09/19/2012   Acute bronchitis/acute sinusitis Patient has run out of her Flonase and therefore this will Sheneika refilled Start patient on Claritin, for postnasal drip, sinus congestion Robitussin codeine for cough Chest x-ray, today, if positive the patient may need another round of antibiotics, Influenza A and B. If symptoms not improved the patient can follow up sooner otherwise followup in 3 weeks       The patient was given clear instructions to go to ER or return to medical center if symptoms don't improve, worsen or new problems develop. The patient verbalized understanding. The patient was told to call to get any lab results if not heard anything in the next week.

## 2013-09-11 ENCOUNTER — Ambulatory Visit (HOSPITAL_COMMUNITY)
Admission: RE | Admit: 2013-09-11 | Discharge: 2013-09-11 | Disposition: A | Discharge status: Home / Self Care | Payer: No Typology Code available for payment source | Source: Ambulatory Visit | Attending: Internal Medicine | Admitting: Internal Medicine

## 2013-09-11 DIAGNOSIS — R0602 Shortness of breath: Secondary | ICD-10-CM | POA: Insufficient documentation

## 2013-09-11 DIAGNOSIS — R0981 Nasal congestion: Secondary | ICD-10-CM

## 2013-09-11 DIAGNOSIS — J3489 Other specified disorders of nose and nasal sinuses: Secondary | ICD-10-CM | POA: Insufficient documentation

## 2013-09-14 ENCOUNTER — Encounter (HOSPITAL_COMMUNITY): Payer: Self-pay | Admitting: *Deleted

## 2013-09-15 ENCOUNTER — Telehealth: Payer: Self-pay | Admitting: *Deleted

## 2013-09-15 ENCOUNTER — Encounter (HOSPITAL_COMMUNITY): Payer: Self-pay | Admitting: Pharmacy Technician

## 2013-09-15 NOTE — Telephone Encounter (Signed)
Message copied by Kymari Lollis, [place] on Tue Sep 15, 2013 12:27 PM ------      Message from: Susie CassetteABROL MD, [facility]      Created: Mon Sep 14, 2013  2:23 PM       Chest x-ray negative ------

## 2013-09-15 NOTE — Telephone Encounter (Signed)
Contacted pt with an interpreter. Call completed effectively.

## 2013-09-28 NOTE — Anesthesia Preprocedure Evaluation (Addendum)
Anesthesia Evaluation  Patient identified by MRN, date of birth, ID band Patient awake    Reviewed: Allergy & Precautions, H&P , NPO status , Patient's Chart, lab work & pertinent test results  Airway Mallampati: II TM Distance: >3 FB Neck ROM: Full    Dental no notable dental hx.    Pulmonary neg pulmonary ROS,  breath sounds clear to auscultation  Pulmonary exam normal       Cardiovascular hypertension, Pt. on medications + Valvular Problems/Murmurs AI Rhythm:Regular Rate:Normal  Mild AI   Neuro/Psych negative neurological ROS  negative psych ROS   GI/Hepatic negative GI ROS, Neg liver ROS,   Endo/Other  negative endocrine ROS  Renal/GU negative Renal ROS  negative genitourinary   Musculoskeletal negative musculoskeletal ROS (+)   Abdominal   Peds negative pediatric ROS (+)  Hematology negative hematology ROS (+)   Anesthesia Other Findings   Reproductive/Obstetrics negative OB ROS                          Anesthesia Physical Anesthesia Plan  ASA: II  Anesthesia Plan: MAC   Post-op Pain Management:    Induction: Intravenous  Airway Management Planned: Nasal Cannula  Additional Equipment:   Intra-op Plan:   Post-operative Plan:   Informed Consent: I have reviewed the patients History and Physical, chart, labs and discussed the procedure including the risks, benefits and alternatives for the proposed anesthesia with the patient or authorized representative who has indicated his/her understanding and acceptance.     Plan Discussed with: CRNA and Surgeon  Anesthesia Plan Comments:         Anesthesia Quick Evaluation

## 2013-09-29 ENCOUNTER — Encounter (HOSPITAL_COMMUNITY)
Admission: RE | Disposition: A | Discharge status: Home / Self Care | Payer: Self-pay | Source: Ambulatory Visit | Attending: Gastroenterology

## 2013-09-29 ENCOUNTER — Ambulatory Visit (HOSPITAL_COMMUNITY): Payer: No Typology Code available for payment source | Admitting: Anesthesiology

## 2013-09-29 ENCOUNTER — Ambulatory Visit (HOSPITAL_COMMUNITY)
Admission: RE | Admit: 2013-09-29 | Discharge: 2013-09-29 | Disposition: A | Discharge status: Home / Self Care | Payer: No Typology Code available for payment source | Source: Ambulatory Visit | Attending: Gastroenterology | Admitting: Gastroenterology

## 2013-09-29 ENCOUNTER — Encounter (HOSPITAL_COMMUNITY): Payer: Self-pay | Admitting: *Deleted

## 2013-09-29 ENCOUNTER — Encounter (HOSPITAL_COMMUNITY): Payer: No Typology Code available for payment source | Admitting: Anesthesiology

## 2013-09-29 DIAGNOSIS — I10 Essential (primary) hypertension: Secondary | ICD-10-CM | POA: Insufficient documentation

## 2013-09-29 DIAGNOSIS — R12 Heartburn: Secondary | ICD-10-CM | POA: Insufficient documentation

## 2013-09-29 DIAGNOSIS — Z860100 Personal history of colon polyps, unspecified: Secondary | ICD-10-CM | POA: Insufficient documentation

## 2013-09-29 DIAGNOSIS — M899 Disorder of bone, unspecified: Secondary | ICD-10-CM | POA: Insufficient documentation

## 2013-09-29 DIAGNOSIS — K219 Gastro-esophageal reflux disease without esophagitis: Secondary | ICD-10-CM | POA: Insufficient documentation

## 2013-09-29 DIAGNOSIS — I359 Nonrheumatic aortic valve disorder, unspecified: Secondary | ICD-10-CM | POA: Insufficient documentation

## 2013-09-29 DIAGNOSIS — Z09 Encounter for follow-up examination after completed treatment for conditions other than malignant neoplasm: Secondary | ICD-10-CM | POA: Insufficient documentation

## 2013-09-29 DIAGNOSIS — M949 Disorder of cartilage, unspecified: Secondary | ICD-10-CM

## 2013-09-29 DIAGNOSIS — E7800 Pure hypercholesterolemia, unspecified: Secondary | ICD-10-CM | POA: Insufficient documentation

## 2013-09-29 HISTORY — PX: COLONOSCOPY WITH PROPOFOL: SHX5780

## 2013-09-29 HISTORY — PX: ESOPHAGOGASTRODUODENOSCOPY (EGD) WITH PROPOFOL: SHX5813

## 2013-09-29 SURGERY — COLONOSCOPY WITH PROPOFOL
Anesthesia: Monitor Anesthesia Care

## 2013-09-29 MED ORDER — PROPOFOL 10 MG/ML IV BOLUS
INTRAVENOUS | Status: AC
  Filled 2013-09-29: qty 20

## 2013-09-29 MED ORDER — LACTATED RINGERS IV SOLN
INTRAVENOUS | Status: DC
  Administered 2013-09-29: 08:00:00 via INTRAVENOUS

## 2013-09-29 MED ORDER — BUTAMBEN-TETRACAINE-BENZOCAINE 2-2-14 % EX AERO
INHALATION_SPRAY | CUTANEOUS | Status: DC | PRN
  Administered 2013-09-29: 1 via TOPICAL

## 2013-09-29 MED ORDER — PROPOFOL INFUSION 10 MG/ML OPTIME
INTRAVENOUS | Status: DC | PRN
  Administered 2013-09-29: 300 ug/kg/min via INTRAVENOUS

## 2013-09-29 MED ORDER — SODIUM CHLORIDE 0.9 % IV SOLN
INTRAVENOUS | Status: DC
Start: 2013-09-29 — End: 2013-09-29

## 2013-09-29 SURGICAL SUPPLY — 24 items

## 2013-09-29 NOTE — Discharge Instructions (Signed)
Soi ru?t k?t, Ch?m [place] sau khi lm th? thu?t (Colonoscopy, Care After) Tham kh?o t? thng tin ny trong vi tu?n t?i. Nh?ng h??ng d?n ny cung c?p cho qu v? thng tin v? cch ch?m [place] b?n thn sau khi lm th? thu?t. Chuyn gia ch?m [place] s?c kh?e c?ng c th? h??ng d?n c? th? h?n cho qu v?. Vi?c ?i?u tr? c?a qu v? ? ???c ln k? ho?ch theo th?c hnh y khoa hi?n t?i, nh?ng v?n ?? ?i khi v?n x?y ra. Hy g?i cho chuyn gia ch?m [place] s?c kh?e n?u qu v? c b?t k? v?n ?? ho?c th?c m?c no sau khi lm th? thu?t. K? V?NG ?I?U G SAU TH? THU?T  Sau khi lm th? thu?t th??ng c nh?ng v?n ?? sau:  M?t l??ng mu nh? trong phn.  ?nh h?i m?t cht v b? ?au th?t ho?c ch??ng b?ng nh?. H??NG D?N CH?M [place] T?I NH  Khng li xe, v?n hnh my mc, ho?c k cc gi?y t? quan tr?ng trong 24 gi?[place] v? c th? t?m vi hoa sen ho?c tr? l?i cc ho?t ??ng th? ch?t thng th??ng, nh?ng c? ??ng v?i m?t t?c ?? ch?m h?n trong 24 gi? ??u tin.  Th??ng xuyn ngh? ng?i trong 24 gi? ??u tin.  ?i d?o xung quanh v ??t m?t ti ?m ln b?ng ?? gip gi?m ?au co th?t ho?c ch??ng b?ng.  U?ng ?? n??c ?? gi? cho n??c ti?u trong ho?c vng nh?t.  Qu v? c th? tr? l?i v?i ch? ?? ?n u?ng bnh th??ng theo ch? d?n c?a chuyn gia ch?m [place] s?c kh?e. Trnh cc mn ?n n?ng ho?c chin rn kh tiu ha.  Trnh u?ng r??u trong 24 gi? ho?c theo h??ng d?n c?a chuyn gia ch?m [place] s?c kh?e.  Ch? s? d?ng thu?c khng c?n k ??n ho?c thu?c c?n k ??n theo ch? d?n c?a chuyn gia ch?m [place] s?c kh?e.  N?u ph?i l?y m?t m?u m (sinh thi?t) trong lc lm th? thu?t:  Khng dng aspirin ho?c thu?c lm long mu trong vng 7 ngy, ho?c theo ch? d?n c?a chuyn gia ch?m [place] s?c kh?e.  Khng u?ng r??u trong 7 ngy, ho?c theo h??ng d?n c?a chuyn gia ch?m [place] s?c kh?e.  ?n th?c ?n m?m trong 24 gi? ??u. ?I KHM N?U: Qu v? b? r?m mu lin t?c trong phn 2 - 3 ngy sau khi lm th? thu?t. NGAY L?P T?C ?I KHM N?U:  Qu v? nhi?u l?n c r?m mu trong  phn.  ?i ??i ti?n c c?c mu ?ng trong phn.  B?ng qu v? ph?ng ln (ch??ng c?ng).  Qu v? b? bu?n nn ho?c nn m?a.  Qu v? b? s?t.  Qu v? b? ?au b?ng t?ng ln, khng thuyn gi?m sau khi dng thu?c. Document Released: 05/13/2013 [facility] Patient Information 2014 [place], [place]. Esophagogastroduodenoscopy Care After Refer to this sheet in the next few weeks. These instructions provide you with information on caring for yourself after your procedure. Your caregiver may also give you more specific instructions. Your treatment has been planned according to current medical practices, but problems sometimes occur. Call your caregiver if you have any problems or questions after your procedure.  HOME CARE INSTRUCTIONS  Do not eat or drink anything until the numbing medicine (local anesthetic) has worn off and your gag reflex has returned. You will know that the local anesthetic has worn off when you can swallow comfortably.  Do not drive for 12 hours after  the procedure or as directed by your caregiver.  Only take medicines as directed by your caregiver. SEEK MEDICAL CARE IF:   You cannot stop coughing.  You are not urinating at all or less than usual. SEEK IMMEDIATE MEDICAL CARE IF:  You have difficulty swallowing.  You cannot eat or drink.  You have worsening throat or chest pain.  You have dizziness, lightheadedness, or you faint.  You have nausea or vomiting.  You have chills.  You have a fever.  You have severe abdominal pain.  You have black, tarry, or bloody stools. Document Released: 07/09/2012 Document Reviewed: 07/09/2012 [facility] Patient Information 2014 [place], [place]. Colonoscopy Care After These instructions give you information on caring for yourself after your procedure. Your doctor may also give you more specific instructions. Call your doctor if you have any problems or questions after your procedure. HOME CARE  Take it easy for the next 24  hours.  Rest.  Walk or use warm packs on your belly (abdomen) if you have belly cramping or gas.  Do not drive for 24 hours.  You may shower.  Do not sign important papers or use machinery for 24 hours.  Drink enough fluids to keep your pee (urine) clear or pale yellow.  Resume your normal diet. Avoid heavy or fried foods.  Avoid alcohol.  Continue taking your normal medicines.  Only take medicine as told by your doctor. Do not take aspirin. If you had growths (polyps) removed:  Do not take aspirin.  Do not drink alcohol for 7 days or as told by your doctor.  Eat a soft diet for 24 hours. GET HELP RIGHT AWAY IF:  You have a fever.  You pass clumps of tissue (blood clots) or fill the toilet with blood.  You have belly pain that gets worse and medicine does not help.  Your belly is puffy (swollen).  You feel sick to your stomach (nauseous) or throw up (vomit). MAKE SURE YOU:  Understand these instructions.  Will watch your condition.  Will get help right away if you are not doing well or get worse. Document Released: 08/25/2010 Document Revised: 10/15/2011 Document Reviewed: 03/30/2013 [facility] Patient Information 2014 [place], [place]. Monitored Anesthesia Care  Monitored anesthesia care is an anesthesia service for a medical procedure. Anesthesia is the loss of the ability to feel pain. It is produced by medications called anesthetics. It may affect a small area of your body (local anesthesia), a large area of your body (regional anesthesia), or your entire body (general anesthesia). The need for monitored anesthesia care depends your procedure, your condition, and the potential need for regional or general anesthesia. It is often provided during procedures where:   General anesthesia may Aasha needed if there are complications. This is because you need special care when you are under general anesthesia.   You will Destini under local or regional anesthesia. This is so  that you are able to have higher levels of anesthesia if needed.   You will receive calming medications (sedatives). This is especially the case if sedatives are given to put you in a semi-conscious state of relaxation (deep sedation). This is because the amount of sedative needed to produce this state can Virlee hard to predict. Too much of a sedative can produce general anesthesia. Monitored anesthesia care is performed by one or more caregivers who have special training in all types of anesthesia. You will need to meet with these caregivers before your procedure. During this meeting, they will  ask you about your medical history. They will also give you instructions to follow. (For example, you will need to stop eating and drinking before your procedure. You may also need to stop or change medications you are taking.) During your procedure, your caregivers will stay with you. They will:   Watch your condition. This includes watching you blood pressure, breathing, and level of pain.   Diagnose and treat problems that occur.   Give medications if they are needed. These may include calming medications (sedatives) and anesthetics.   Make sure you are comfortable.  Having monitored anesthesia care does not necessarily mean that you will Nikelle under anesthesia. It does mean that your caregivers will Kaleya able to manage anesthesia if you need it or if it occurs. It also means that you will Brittny able to have a different type of anesthesia than you are having if you need it. When your procedure is complete, your caregivers will continue to watch your condition. They will make sure any medications wear off before you are allowed to go home.  Document Released: 04/18/2005 Document Revised: 11/17/2012 Document Reviewed: 09/03/2012 [facility] Patient Information 2014 [place], [place].

## 2013-09-29 NOTE — H&P (Signed)
  Procedures: Surveillance colonoscopy. Personal history of colon polyps removed colonoscopically. Diagnostic esophagogastroduodenoscopy to evaluate heartburn.  History: The patient is a 63 year old the of the knees female who does not speak [place]. She underwent a normal screening colonoscopy with random colon biopsies in 2002. She underwent a normal esophagogastroduodenoscopy with small bowel biopsies in 2002. She underwent a normal screening colonoscopy in 2006.  The patient's daughter tells me her mother underwent a repeat colon[place] in [place] with [place] of multiple colon polyps.  Despite taking Zantac the patient continues to have burning retrosternal discomfort.  The patient is scheduled to undergo a surveillance colonoscopy and diagnostic esophagogastroduodenoscopy.  Past medical history: Hypertension. Hypercholesterolemia. Depression. Gastroesophageal reflux. Aortic valve insufficiency. Chronic insomnia. Osteopenia.  Medication allergies: Aspirin  Exam: The patient is alert and lying comfortably on the endoscopy stretcher. Cardiac exam reveals a regular rhythm. Lungs are clear to auscultation. Abdomen is soft and nontender to palpation.  Plan: Proceed with diagnostic esophagogastroduodenoscopy and surveillance colonoscopy

## 2013-09-29 NOTE — Op Note (Signed)
Procedure: Surveillance colonoscopy. Normal colonoscopy in 2002. Normal colonoscopy in 2006. Colonoscopy with removal of colonic polyps in [place] in 2012 (no colonoscopy report or pathology report available).  Procedure: The patient was placed in the left lateral decubitus position. Anal inspection and digital rectal exam were normal. The Pentax pediatric colonoscope was introduced into the rectum and advanced to the cecum. A normal-appearing ileocecal valve and appendiceal orifice were identified. Colonic preparation for the exam today was good.  Rectum. Normal. Retroflexed view of the distal rectum normal  Sigmoid colon and descending colon. Normal  Splenic flexure. Normal  Transverse colon. Normal  Hepatic flexure. Normal  Ascending colon. Normal  Cecum and ileocecal valve. Normal  Assessment: Normal surveillance proctocolonoscopy to the cecum  Procedure: Diagnostic esophagogastroduodenoscopy to evaluate heartburn  Procedure: The Pentax gastroscope was passed through the posterior hypopharynx into the proximal esophagus without difficulty. The hypopharynx, larynx, and vocal cords appeared normal  Esophagoscopy: The proximal, mid, and lower segments of the esophageal mucosa appeared normal. The squamocolumnar junction was noted at 37 cm from the incisor teeth. There was no endoscopic evidence for the presence of erosive esophagitis or Barrett's esophagus.  Gastroscopy: Retroflexed view of the gastric cardia and fundus was normal. The gastric body appeared normal. In the distal gastric antrum, a few small erosions unassociated with bleeding were present. The pylorus appeared normal. Biopsies were performed from the gastric antrum and gastric body to screen for H. pylori gastritis.  Duodenoscopy: The duodenal bulb and descending duodenum appeared normal.  Assessment: Erosions were present in the distal gastric antrum. Biopsies were performed to look for H. pylori gastritis.

## 2013-09-29 NOTE — Anesthesia Postprocedure Evaluation (Signed)
  Anesthesia Post-op Note  Patient: Danielle Richard  Procedure(s) Performed: Procedure(s) (LRB): COLONOSCOPY WITH PROPOFOL (N/A) ESOPHAGOGASTRODUODENOSCOPY (EGD) WITH PROPOFOL (N/A)  Patient Location: PACU  Anesthesia Type: MAC  Level of Consciousness: awake and alert   Airway and Oxygen Therapy: Patient Spontanous Breathing  Post-op Pain: mild  Post-op Assessment: Post-op Vital signs reviewed, Patient's Cardiovascular Status Stable, Respiratory Function Stable, Patent Airway and No signs of Nausea or vomiting  Last Vitals:  Filed Vitals:   09/29/13 0926  BP:   Pulse:   Temp: 36.4 C  Resp:     Post-op Vital Signs: stable   Complications: No apparent anesthesia complications

## 2013-09-29 NOTE — Transfer of Care (Signed)
Immediate Anesthesia Transfer of Care Note  Patient: Danielle Richard  Procedure(s) Performed: Procedure(s): COLONOSCOPY WITH PROPOFOL (N/A) ESOPHAGOGASTRODUODENOSCOPY (EGD) WITH PROPOFOL (N/A)  Patient Location: PACU and Endoscopy Unit  Anesthesia Type:MAC  Level of Consciousness: sedated and patient cooperative  Airway & Oxygen Therapy: Patient Spontanous Breathing and Patient connected to nasal cannula oxygen  Post-op Assessment: Report given to PACU RN and Post -op Vital signs reviewed and stable  Post vital signs: Reviewed and stable  Complications: No apparent anesthesia complications

## 2013-09-30 ENCOUNTER — Encounter (HOSPITAL_COMMUNITY): Payer: Self-pay | Admitting: Gastroenterology

## 2013-10-14 ENCOUNTER — Ambulatory Visit: Payer: No Typology Code available for payment source | Attending: Internal Medicine | Admitting: Internal Medicine

## 2013-10-14 ENCOUNTER — Encounter: Payer: Self-pay | Admitting: Internal Medicine

## 2013-10-14 VITALS — BP 116/76 | HR 80 | Temp 98.3°F | Resp 17

## 2013-10-14 DIAGNOSIS — I10 Essential (primary) hypertension: Secondary | ICD-10-CM | POA: Insufficient documentation

## 2013-10-14 DIAGNOSIS — E785 Hyperlipidemia, unspecified: Secondary | ICD-10-CM

## 2013-10-14 DIAGNOSIS — G4700 Insomnia, unspecified: Secondary | ICD-10-CM | POA: Insufficient documentation

## 2013-10-14 DIAGNOSIS — Z760 Encounter for issue of repeat prescription: Secondary | ICD-10-CM | POA: Insufficient documentation

## 2013-10-14 DIAGNOSIS — F3289 Other specified depressive episodes: Secondary | ICD-10-CM | POA: Insufficient documentation

## 2013-10-14 DIAGNOSIS — K219 Gastro-esophageal reflux disease without esophagitis: Secondary | ICD-10-CM

## 2013-10-14 DIAGNOSIS — F329 Major depressive disorder, single episode, unspecified: Secondary | ICD-10-CM | POA: Insufficient documentation

## 2013-10-14 DIAGNOSIS — H538 Other visual disturbances: Secondary | ICD-10-CM | POA: Insufficient documentation

## 2013-10-14 MED ORDER — RANITIDINE HCL 150 MG PO TABS: 75.0000 mg | ORAL_TABLET | Freq: Two times a day (BID) | ORAL | Status: DC

## 2013-10-14 MED ORDER — ATORVASTATIN CALCIUM 40 MG PO TABS: 40.0000 mg | ORAL_TABLET | Freq: Every day | ORAL | Status: DC

## 2013-10-14 MED ORDER — TRIAMTERENE-HCTZ 37.5-25 MG PO CAPS: 1.0000 | ORAL_CAPSULE | Freq: Every day | ORAL | Status: DC

## 2013-10-14 MED ORDER — OMEGA-3-ACID ETHYL ESTERS 1 G PO CAPS: 1.0000 g | ORAL_CAPSULE | Freq: Every day | ORAL | Status: DC

## 2013-10-14 MED ORDER — CITALOPRAM HYDROBROMIDE 20 MG PO TABS
20.0000 mg | ORAL_TABLET | Freq: Every morning | ORAL | Status: DC
Start: 2013-10-14 — End: 2014-01-07

## 2013-10-14 MED ORDER — CALCIUM CARBONATE-VITAMIN D 500-200 MG-UNIT PO TABS: 1.0000 | ORAL_TABLET | Freq: Every day | ORAL | Status: DC

## 2013-10-14 MED ORDER — TRAZODONE HCL 100 MG PO TABS: 100.0000 mg | ORAL_TABLET | Freq: Every day | ORAL | Status: DC

## 2013-10-14 NOTE — Progress Notes (Signed)
Patient here for follow up from her flu like symptom Needs medications refilled

## 2013-10-14 NOTE — Progress Notes (Signed)
MRN: [id number] Name: Danielle Richard  Sex: female Age: 63 y.o. DOB: 08-09-1950  Allergies: Tylenol and Aspirin  Chief Complaint  Patient presents with  . Medication Refill    HPI: Patient is 63 y.o. female who comes today for followup her, history of hypertension hyperlipidemia depression GERD, she is requesting refill on the medication, denies any acute symptoms but reported to have problem with vision for several months which is getting worse.  Past Medical History  Diagnosis Date  . Hypertension   . HLD (hyperlipidemia)   . Depression   . Personal history of colonic polyps   . GERD (gastroesophageal reflux disease)   . Insomnia   . Aortic insufficiency     Past Surgical History  Procedure Laterality Date  . No past surgeries    . Colonoscopy with propofol N/A 09/29/2013    Procedure: COLONOSCOPY WITH PROPOFOL;  Surgeon: Charolett Bumpers, MD;  Location: [facility];  Service: Endoscopy;  Laterality: N/A;  . Esophagogastroduodenoscopy (egd) with propofol N/A 09/29/2013    Procedure: ESOPHAGOGASTRODUODENOSCOPY (EGD) WITH PROPOFOL;  Surgeon: Charolett Bumpers, MD;  Location: [facility];  Service: Endoscopy;  Laterality: N/A;      Medication List       This list is accurate as of: 10/14/13  4:53 PM.  Always use your most recent med list.               atorvastatin 40 MG tablet  Commonly known as:  LIPITOR  Take 1 tablet (40 mg total) by mouth daily.     calcium-vitamin D 500-200 MG-UNIT per tablet  Commonly known as:  OSCAL WITH D  Take 1 tablet by mouth daily with breakfast.     citalopram 20 MG tablet  Commonly known as:  CELEXA  Take 1 tablet (20 mg total) by mouth every morning.     omega-3 acid ethyl esters 1 G capsule  Commonly known as:  LOVAZA  Take 1 capsule (1 g total) by mouth daily.     ranitidine 150 MG tablet  Commonly known as:  ZANTAC  Take 0.5 tablets (75 mg total) by mouth 2 (two) times daily. Half tablet twice daily     traZODone 100  MG tablet  Commonly known as:  DESYREL  Take 1 tablet (100 mg total) by mouth at bedtime.     triamterene-hydrochlorothiazide 37.5-25 MG per capsule  Commonly known as:  DYAZIDE  Take 1 each (1 capsule total) by mouth daily.        Meds ordered this encounter  Medications  . atorvastatin (LIPITOR) 40 MG tablet    Sig: Take 1 tablet (40 mg total) by mouth daily.    Dispense:  90 tablet    Refill:  3  . calcium-vitamin D (OSCAL WITH D) 500-200 MG-UNIT per tablet    Sig: Take 1 tablet by mouth daily with breakfast.    Dispense:  30 tablet    Refill:  3  . citalopram (CELEXA) 20 MG tablet    Sig: Take 1 tablet (20 mg total) by mouth every morning.    Dispense:  30 tablet    Refill:  3  . omega-3 acid ethyl esters (LOVAZA) 1 G capsule    Sig: Take 1 capsule (1 g total) by mouth daily.    Dispense:  30 capsule    Refill:  3  . ranitidine (ZANTAC) 150 MG tablet    Sig: Take 0.5 tablets (75 mg total) by mouth 2 (two)  times daily. Half tablet twice daily    Dispense:  60 tablet    Refill:  2  . traZODone (DESYREL) 100 MG tablet    Sig: Take 1 tablet (100 mg total) by mouth at bedtime.    Dispense:  30 tablet    Refill:  3  . triamterene-hydrochlorothiazide (DYAZIDE) 37.5-25 MG per capsule    Sig: Take 1 each (1 capsule total) by mouth daily.    Dispense:  30 capsule    Refill:  3    Immunization History  Administered Date(s) Administered  . Influenza,inj,Quad PF,36+ Mos 04/23/2013    Family History  Problem Relation Age of Onset  . CAD Mother     History  Substance Use Topics  . Smoking status: Never Smoker   . Smokeless tobacco: Never Used  . Alcohol Use: No    Review of Systems   As noted in HPI  Filed Vitals:   10/14/13 1610  BP: 116/76  Pulse: 80  Temp: 98.3 F (36.8 C)  Resp: 17    Physical Exam  Physical Exam  Constitutional: No distress.  Eyes: EOM are normal. Pupils are equal, round, and reactive to light.  Cardiovascular: Normal rate and  regular rhythm.   Pulmonary/Chest: Breath sounds normal. No respiratory distress. She has no wheezes. She has no rales.    CBC    Component Value Date/Time   WBC 4.1 09/09/2013 1036   RBC 4.29 09/09/2013 1036   HGB 13.0 09/09/2013 1036   HCT 37.9 09/09/2013 1036   PLT 281 09/09/2013 1036   MCV 88.3 09/09/2013 1036   LYMPHSABS 1.2 09/09/2013 1036   MONOABS 0.5 09/09/2013 1036   EOSABS 0.0 09/09/2013 1036   BASOSABS 0.0 09/09/2013 1036    CMP     Component Value Date/Time   NA 141 08/18/2013 1445   K 4.2 08/18/2013 1445   CL 99 08/18/2013 1445   CO2 30 08/18/2013 1445   GLUCOSE 97 08/18/2013 1445   BUN 16 08/18/2013 1445   CREATININE 1.05 08/18/2013 1445   CREATININE 0.75 09/19/2012 1214   CALCIUM 9.3 08/18/2013 1445   PROT 7.4 08/18/2013 1445   ALBUMIN 4.3 08/18/2013 1445   AST 19 08/18/2013 1445   ALT 15 08/18/2013 1445   ALKPHOS 61 08/18/2013 1445   BILITOT 0.5 08/18/2013 1445   GFRNONAA >90 09/19/2012 1214   GFRAA >90 09/19/2012 1214    Lab Results  Component Value Date/Time   CHOL 187 07/16/2013  9:17 AM    No components found with this basename: hga1c    Lab Results  Component Value Date/Time   AST 19 08/18/2013  2:45 PM    Assessment and Plan  HLD (hyperlipidemia) - Plan: atorvastatin (LIPITOR) 40 MG tablet  HTN (hypertension) - Plan: triamterene-hydrochlorothiazide (DYAZIDE) 37.5-25 MG per capsule  GERD (gastroesophageal reflux disease) - Plan: ranitidine (ZANTAC) 150 MG tablet  Dyslipidemia - Plan: omega-3 acid ethyl esters (LOVAZA) 1 G capsule  Insomnia - Plan: traZODone (DESYREL) 100 MG tablet  Blurry vision - Plan: Ambulatory referral to Ophthalmology  Will do fasting blood work on the next visit.  Return in about 3 months (around 01/14/2014) for hypertension.  Doris CheadleADVANI, Karstyn Birkey, MD

## 2013-10-15 ENCOUNTER — Ambulatory Visit: Payer: No Typology Code available for payment source | Admitting: Internal Medicine

## 2013-10-16 ENCOUNTER — Ambulatory Visit: Payer: No Typology Code available for payment source | Admitting: Internal Medicine

## 2013-10-21 ENCOUNTER — Ambulatory Visit: Payer: No Typology Code available for payment source | Admitting: Internal Medicine

## 2013-12-14 ENCOUNTER — Telehealth: Payer: Self-pay | Admitting: Internal Medicine

## 2013-12-14 NOTE — Telephone Encounter (Signed)
Pt has called in today to schedule an appt and would like to Ellyana called to see if she needs any lab work before her next scheduled appointment; please f/u with pt

## 2013-12-18 NOTE — Telephone Encounter (Signed)
Interpreter line used Patient not available Unable to leave a message

## 2014-01-07 ENCOUNTER — Ambulatory Visit: Payer: No Typology Code available for payment source | Attending: Internal Medicine | Admitting: Internal Medicine

## 2014-01-07 ENCOUNTER — Encounter: Payer: Self-pay | Admitting: Internal Medicine

## 2014-01-07 VITALS — BP 140/80 | HR 61 | Temp 98.2°F | Resp 16 | Wt 108.0 lb

## 2014-01-07 DIAGNOSIS — K219 Gastro-esophageal reflux disease without esophagitis: Secondary | ICD-10-CM

## 2014-01-07 DIAGNOSIS — I10 Essential (primary) hypertension: Secondary | ICD-10-CM

## 2014-01-07 DIAGNOSIS — R21 Rash and other nonspecific skin eruption: Secondary | ICD-10-CM

## 2014-01-07 DIAGNOSIS — H538 Other visual disturbances: Secondary | ICD-10-CM

## 2014-01-07 DIAGNOSIS — F3289 Other specified depressive episodes: Secondary | ICD-10-CM | POA: Insufficient documentation

## 2014-01-07 DIAGNOSIS — G4700 Insomnia, unspecified: Secondary | ICD-10-CM

## 2014-01-07 DIAGNOSIS — F329 Major depressive disorder, single episode, unspecified: Secondary | ICD-10-CM | POA: Insufficient documentation

## 2014-01-07 DIAGNOSIS — E785 Hyperlipidemia, unspecified: Secondary | ICD-10-CM

## 2014-01-07 DIAGNOSIS — Z79899 Other long term (current) drug therapy: Secondary | ICD-10-CM | POA: Insufficient documentation

## 2014-01-07 MED ORDER — ATORVASTATIN CALCIUM 40 MG PO TABS: 40.0000 mg | ORAL_TABLET | Freq: Every day | ORAL | Status: DC

## 2014-01-07 MED ORDER — METHYLPREDNISOLONE 4 MG PO KIT: PACK | ORAL | Status: DC

## 2014-01-07 MED ORDER — CITALOPRAM HYDROBROMIDE 20 MG PO TABS: 20.0000 mg | ORAL_TABLET | Freq: Every morning | ORAL | Status: DC

## 2014-01-07 MED ORDER — RANITIDINE HCL 150 MG PO TABS: 75.0000 mg | ORAL_TABLET | Freq: Two times a day (BID) | ORAL | Status: DC

## 2014-01-07 MED ORDER — TRAZODONE HCL 100 MG PO TABS: 100.0000 mg | ORAL_TABLET | Freq: Every day | ORAL | Status: DC

## 2014-01-07 MED ORDER — TRIAMTERENE-HCTZ 37.5-25 MG PO CAPS: 1.0000 | ORAL_CAPSULE | Freq: Every day | ORAL | Status: DC

## 2014-01-07 NOTE — Progress Notes (Signed)
MRN: [id number] Name: Danielle Richard  Sex: female Age: 63 y.o. DOB: 1951-03-27  Allergies: Tylenol and Aspirin  Chief Complaint  Patient presents with  . Follow-up    HPI: Patient is 63 y.o. female who has history of hypertension hyperlipidemia, GERD her depression insomnia comes today for followup, she reported to have itchy rash on her back, she denies any change in soap detergent cosmetic as per patient she went to the backyard and after that she has the symptoms denies any fever chills chest pain or shortness of breath, patient is requesting refill on her medications, today her blood pressure is slightly elevated manual is 140/80 as per patient she has been taking her medications.   Past Medical History  Diagnosis Date  . Hypertension   . HLD (hyperlipidemia)   . Depression   . Personal history of colonic polyps   . GERD (gastroesophageal reflux disease)   . Insomnia   . Aortic insufficiency     Past Surgical History  Procedure Laterality Date  . No past surgeries    . Colonoscopy with propofol N/A 09/29/2013    Procedure: COLONOSCOPY WITH PROPOFOL;  Surgeon: Charolett Bumpers, MD;  Location: [facility];  Service: Endoscopy;  Laterality: N/A;  . Esophagogastroduodenoscopy (egd) with propofol N/A 09/29/2013    Procedure: ESOPHAGOGASTRODUODENOSCOPY (EGD) WITH PROPOFOL;  Surgeon: Charolett Bumpers, MD;  Location: [facility];  Service: Endoscopy;  Laterality: N/A;      Medication List       This list is accurate as of: 01/07/14 12:37 PM.  Always use your most recent med list.               atorvastatin 40 MG tablet  Commonly known as:  LIPITOR  Take 1 tablet (40 mg total) by mouth daily.     calcium-vitamin D 500-200 MG-UNIT per tablet  Commonly known as:  OSCAL WITH D  Take 1 tablet by mouth daily with breakfast.     citalopram 20 MG tablet  Commonly known as:  CELEXA  Take 1 tablet (20 mg total) by mouth every morning.     methylPREDNISolone 4 MG tablet    Commonly known as:  MEDROL DOSEPAK  follow package directions     omega-3 acid ethyl esters 1 G capsule  Commonly known as:  LOVAZA  Take 1 capsule (1 g total) by mouth daily.     ranitidine 150 MG tablet  Commonly known as:  ZANTAC  Take 0.5 tablets (75 mg total) by mouth 2 (two) times daily. Half tablet twice daily     traZODone 100 MG tablet  Commonly known as:  DESYREL  Take 1 tablet (100 mg total) by mouth at bedtime.     triamterene-hydrochlorothiazide 37.5-25 MG per capsule  Commonly known as:  DYAZIDE  Take 1 each (1 capsule total) by mouth daily.        Meds ordered this encounter  Medications  . methylPREDNISolone (MEDROL DOSEPAK) 4 MG tablet    Sig: follow package directions    Dispense:  21 tablet    Refill:  0    Immunization History  Administered Date(s) Administered  . Influenza,inj,Quad PF,36+ Mos 04/23/2013    Family History  Problem Relation Age of Onset  . CAD Mother     History  Substance Use Topics  . Smoking status: Never Smoker   . Smokeless tobacco: Never Used  . Alcohol Use: No    Review of Systems   As noted in  HPI  Filed Vitals:   01/07/14 1232  BP: 140/80  Pulse:   Temp:   Resp:     Physical Exam  Physical Exam  Constitutional: No distress.  Eyes: EOM are normal. Pupils are equal, round, and reactive to light.  Cardiovascular: Normal rate and regular rhythm.   Pulmonary/Chest: Breath sounds normal. No respiratory distress. She has no wheezes. She has no rales.  Skin:  Rash on upper back     CBC    Component Value Date/Time   WBC 4.1 09/09/2013 1036   RBC 4.29 09/09/2013 1036   HGB 13.0 09/09/2013 1036   HCT 37.9 09/09/2013 1036   PLT 281 09/09/2013 1036   MCV 88.3 09/09/2013 1036   LYMPHSABS 1.2 09/09/2013 1036   MONOABS 0.5 09/09/2013 1036   EOSABS 0.0 09/09/2013 1036   BASOSABS 0.0 09/09/2013 1036    CMP     Component Value Date/Time   NA 141 08/18/2013 1445   K 4.2 08/18/2013 1445   CL 99 08/18/2013 1445   CO2 30  08/18/2013 1445   GLUCOSE 97 08/18/2013 1445   BUN 16 08/18/2013 1445   CREATININE 1.05 08/18/2013 1445   CREATININE 0.75 09/19/2012 1214   CALCIUM 9.3 08/18/2013 1445   PROT 7.4 08/18/2013 1445   ALBUMIN 4.3 08/18/2013 1445   AST 19 08/18/2013 1445   ALT 15 08/18/2013 1445   ALKPHOS 61 08/18/2013 1445   BILITOT 0.5 08/18/2013 1445   GFRNONAA 57* 08/18/2013 1445   GFRNONAA >90 09/19/2012 1214   GFRAA 66 08/18/2013 1445   GFRAA >90 09/19/2012 1214    Lab Results  Component Value Date/Time   CHOL 187 07/16/2013  9:17 AM    No components found with this basename: hga1c    Lab Results  Component Value Date/Time   AST 19 08/18/2013  2:45 PM    Assessment and Plan  HTN (hypertension) - Plan: I have advised patient for DASH diet, continue with current medication, we'll do blood chemistry COMPLETE METABOLIC PANEL WITH GFR  HLD (hyperlipidemia) - Plan: Fasting Lipid panel continue with her Lipitor 40 mg.  GERD (gastroesophageal reflux disease) Continue with her Zantac.  Rash and nonspecific skin eruption - Plan: methylPREDNISolone (MEDROL DOSEPAK) 4 MG tablet  Blurry vision - Plan: Ambulatory referral to Ophthalmology As per patient she was told in the past that she might have glaucoma.  Return in about 3 months (around 04/09/2014) for hypertension.  Doris Cheadleeepak Onie Hayashi, MD

## 2014-01-07 NOTE — Progress Notes (Signed)
Patient here for follow up on her HTN and gerd Here with interpreter

## 2014-01-07 NOTE — Patient Instructions (Signed)
DASH Diet  The DASH diet stands for "Dietary Approaches to Stop Hypertension." It is a healthy eating plan that has been shown to reduce high blood pressure (hypertension) in as little as 14 days, while also possibly providing other significant health benefits. These other health benefits include reducing the risk of breast cancer after menopause and reducing the risk of type 2 diabetes, heart disease, colon cancer, and stroke. Health benefits also include weight loss and slowing kidney failure in patients with chronic kidney disease.   DIET GUIDELINES  · Limit salt (sodium). Your diet should contain less than 1500 mg of sodium daily.  · Limit refined or processed carbohydrates. Your diet should include mostly whole grains. Desserts and added sugars should Danielle Richard used sparingly.  · Include small amounts of heart-healthy fats. These types of fats include nuts, oils, and tub margarine. Limit saturated and trans fats. These fats have been shown to Danielle Richard harmful in the body.  CHOOSING FOODS   The following food groups are based on a 2000 calorie diet. See your Registered Dietitian for individual calorie needs.  Grains and Grain Products (6 to 8 servings daily)  · Eat More Often: Whole-wheat bread, brown rice, whole-grain or wheat pasta, quinoa, popcorn without added fat or salt (air popped).  · Eat Less Often: White bread, white pasta, white rice, cornbread.  Vegetables (4 to 5 servings daily)  · Eat More Often: Fresh, frozen, and canned vegetables. Vegetables may Danielle Richard raw, steamed, roasted, or grilled with a minimal amount of fat.  · Eat Less Often/Avoid: Creamed or fried vegetables. Vegetables in a cheese sauce.  Fruit (4 to 5 servings daily)  · Eat More Often: All fresh, canned (in natural juice), or frozen fruits. Dried fruits without added sugar. One hundred percent fruit juice (½ cup [237 mL] daily).  · Eat Less Often: Dried fruits with added sugar. Canned fruit in light or heavy syrup.  Lean Meats, Fish, and Poultry (2  servings or less daily. One serving is 3 to 4 oz [85-114 g]).  · Eat More Often: Ninety percent or leaner ground beef, tenderloin, sirloin. Round cuts of beef, chicken breast, turkey breast. All fish. Grill, bake, or broil your meat. Nothing should Danielle Richard fried.  · Eat Less Often/Avoid: Fatty cuts of meat, turkey, or chicken leg, thigh, or wing. Fried cuts of meat or fish.  Dairy (2 to 3 servings)  · Eat More Often: Low-fat or fat-free milk, low-fat plain or light yogurt, reduced-fat or part-skim cheese.  · Eat Less Often/Avoid: Milk (whole, 2%). Whole milk yogurt. Full-fat cheeses.  Nuts, Seeds, and Legumes (4 to 5 servings per week)  · Eat More Often: All without added salt.  · Eat Less Often/Avoid: Salted nuts and seeds, canned beans with added salt.  Fats and Sweets (limited)  · Eat More Often: Vegetable oils, tub margarines without trans fats, sugar-free gelatin. Mayonnaise and salad dressings.  · Eat Less Often/Avoid: Coconut oils, palm oils, butter, stick margarine, cream, half and half, cookies, candy, pie.  FOR MORE INFORMATION  The Dash Diet Eating Plan: www.dashdiet.org  Document Released: 07/12/2011 Document Revised: 10/15/2011 Document Reviewed: 07/12/2011  [employer]® Patient Information ©2014 [employer].

## 2014-01-08 ENCOUNTER — Ambulatory Visit: Payer: No Typology Code available for payment source | Attending: Internal Medicine

## 2014-01-08 DIAGNOSIS — I10 Essential (primary) hypertension: Secondary | ICD-10-CM

## 2014-01-08 DIAGNOSIS — E785 Hyperlipidemia, unspecified: Secondary | ICD-10-CM

## 2014-01-08 LAB — COMPLETE METABOLIC PANEL WITH GFR
ALT: 12 U/L (ref 0–35)
AST: 18 U/L (ref 0–37)
Albumin: 4.1 g/dL (ref 3.5–5.2)
Alkaline Phosphatase: 55 U/L (ref 39–117)
BUN: 18 mg/dL (ref 6–23)
CO2: 32 mEq/L (ref 19–32)
Calcium: 9.3 mg/dL (ref 8.4–10.5)
Chloride: 99 mEq/L (ref 96–112)
Creat: 0.77 mg/dL (ref 0.50–1.10)
GFR, Est African American: >89 mL/min
GFR, Est Non African American: 83 mL/min
Glucose, Bld: 97 mg/dL (ref 70–99)
Potassium: 4.4 mEq/L (ref 3.5–5.3)
Sodium: 140 mEq/L (ref 135–145)
Total Bilirubin: 0.3 mg/dL (ref 0.2–1.2)
Total Protein: 6.8 g/dL (ref 6.0–8.3)

## 2014-01-08 LAB — LIPID PANEL
Cholesterol: 177 mg/dL (ref 0–200)
HDL: 33 mg/dL — ABNORMAL LOW (ref >39)
LDL Cholesterol: 102 mg/dL — ABNORMAL HIGH (ref 0–99)
Total CHOL/HDL Ratio: 5.4 Ratio
Triglycerides: 210 mg/dL — ABNORMAL HIGH (ref <150)
VLDL: 42 mg/dL — ABNORMAL HIGH (ref 0–40)

## 2014-01-11 ENCOUNTER — Telehealth: Payer: Self-pay

## 2014-01-11 NOTE — Telephone Encounter (Signed)
Interpreter line used Patient not available Message left on voice mail to return our call 

## 2014-01-11 NOTE — Telephone Encounter (Signed)
Message copied by Lestine Mount on Mon Jan 11, 2014 10:38 AM ------      Message from: Doris Cheadle      Created: Mon Jan 11, 2014  9:19 AM       Blood work reviewed, call and let the patient know that her cholesterol is improving, continue with Lipitor and advise for low-fat diet. ------

## 2014-01-12 ENCOUNTER — Telehealth: Payer: Self-pay

## 2014-01-12 NOTE — Telephone Encounter (Signed)
Pt returning call, pls f/u with pt.  °

## 2014-01-12 NOTE — Telephone Encounter (Signed)
Patient daughter Tana Conch returned our call She is aware of her mom's lab results  And will let her mom  Know as well

## 2014-01-14 ENCOUNTER — Telehealth: Payer: Self-pay

## 2014-01-14 NOTE — Telephone Encounter (Signed)
Patient has gotten her moms lab results

## 2014-02-11 ENCOUNTER — Ambulatory Visit: Payer: No Typology Code available for payment source | Attending: Internal Medicine

## 2014-02-12 ENCOUNTER — Other Ambulatory Visit: Payer: Self-pay

## 2014-02-12 DIAGNOSIS — E785 Hyperlipidemia, unspecified: Secondary | ICD-10-CM

## 2014-02-12 MED ORDER — OMEGA-3-ACID ETHYL ESTERS 1 G PO CAPS
1.0000 g | ORAL_CAPSULE | Freq: Every day | ORAL | Status: DC
Start: 2014-02-12 — End: 2014-10-13

## 2014-04-09 ENCOUNTER — Ambulatory Visit: Payer: No Typology Code available for payment source | Attending: Internal Medicine

## 2014-04-09 ENCOUNTER — Ambulatory Visit: Payer: No Typology Code available for payment source | Attending: Internal Medicine | Admitting: Internal Medicine

## 2014-04-09 ENCOUNTER — Encounter: Payer: Self-pay | Admitting: Internal Medicine

## 2014-04-09 VITALS — BP 138/78 | HR 65 | Temp 98.6°F | Resp 16 | Ht 61.0 in | Wt 110.0 lb

## 2014-04-09 DIAGNOSIS — I10 Essential (primary) hypertension: Secondary | ICD-10-CM | POA: Insufficient documentation

## 2014-04-09 DIAGNOSIS — R0781 Pleurodynia: Secondary | ICD-10-CM

## 2014-04-09 DIAGNOSIS — K219 Gastro-esophageal reflux disease without esophagitis: Secondary | ICD-10-CM | POA: Insufficient documentation

## 2014-04-09 DIAGNOSIS — E785 Hyperlipidemia, unspecified: Secondary | ICD-10-CM | POA: Insufficient documentation

## 2014-04-09 DIAGNOSIS — F3289 Other specified depressive episodes: Secondary | ICD-10-CM | POA: Insufficient documentation

## 2014-04-09 DIAGNOSIS — Z79899 Other long term (current) drug therapy: Secondary | ICD-10-CM | POA: Insufficient documentation

## 2014-04-09 DIAGNOSIS — Z139 Encounter for screening, unspecified: Secondary | ICD-10-CM

## 2014-04-09 DIAGNOSIS — R079 Chest pain, unspecified: Secondary | ICD-10-CM | POA: Insufficient documentation

## 2014-04-09 DIAGNOSIS — F329 Major depressive disorder, single episode, unspecified: Secondary | ICD-10-CM | POA: Insufficient documentation

## 2014-04-09 DIAGNOSIS — Z23 Encounter for immunization: Secondary | ICD-10-CM

## 2014-04-09 MED ORDER — ROSUVASTATIN CALCIUM 10 MG PO TABS: 10.0000 mg | ORAL_TABLET | Freq: Every day | ORAL | Status: DC

## 2014-04-09 NOTE — Progress Notes (Signed)
Pt here for 3 month f/u HTN with medication management Pt is compliant with taking all medications C/o pain x 3 yrs under left breast Need medication refill on cholesterol med Flu vaccine given [name] language

## 2014-04-09 NOTE — Progress Notes (Signed)
MRN: [id number] Name: Danielle Richard  Sex: female Age: 63 y.o. DOB: [birth year]  Allergies: Tylenol and Aspirin  Chief Complaint  Patient presents with  . Follow-up  . Hypertension  . Medication Refill    HPI: Patient is 63 y.o. female who has history of hypertension hyperlipidemia, GERD comes today for followup reported to have left-sided rib pain she denies any fall or trauma she had ultrasound of her left breast done in 2013 which was negative, last mammogram was last year and she is due, denies any fever chills or shortness of breath, patient is requesting refill on her medication she takes Crestor 10 mg and that medication she gets from [facility]. For hypertension she is on Dyazide and her blood pressure is controlled.  Past Medical History  Diagnosis Date  . Hypertension   . HLD (hyperlipidemia)   . Depression   . Personal history of colonic polyps   . GERD (gastroesophageal reflux disease)   . Insomnia   . Aortic insufficiency     Past Surgical History  Procedure Laterality Date  . No past surgeries    . Colonoscopy with propofol N/A 09/29/2013    Procedure: COLONOSCOPY WITH PROPOFOL;  Surgeon: Charolett Bumpers, MD;  Location: [facility];  Service: Endoscopy;  Laterality: N/A;  . Esophagogastroduodenoscopy (egd) with propofol N/A 09/29/2013    Procedure: ESOPHAGOGASTRODUODENOSCOPY (EGD) WITH PROPOFOL;  Surgeon: Charolett Bumpers, MD;  Location: [facility];  Service: Endoscopy;  Laterality: N/A;      Medication List       This list is accurate as of: 04/09/14 10:55 AM.  Always use your most recent med list.               atorvastatin 40 MG tablet  Commonly known as:  LIPITOR  Take 1 tablet (40 mg total) by mouth daily.     calcium-vitamin D 500-200 MG-UNIT per tablet  Commonly known as:  OSCAL WITH D  Take 1 tablet by mouth daily with breakfast.     citalopram 20 MG tablet  Commonly known as:  CELEXA  Take 1 tablet (20 mg total) by  mouth every morning.     methylPREDNISolone 4 MG tablet  Commonly known as:  MEDROL DOSEPAK  follow package directions     omega-3 acid ethyl esters 1 G capsule  Commonly known as:  LOVAZA  Take 1 capsule (1 g total) by mouth daily.     ranitidine 150 MG tablet  Commonly known as:  ZANTAC  Take 0.5 tablets (75 mg total) by mouth 2 (two) times daily. Half tablet twice daily     rosuvastatin 10 MG tablet  Commonly known as:  CRESTOR  Take 1 tablet (10 mg total) by mouth daily.     traZODone 100 MG tablet  Commonly known as:  DESYREL  Take 1 tablet (100 mg total) by mouth at bedtime.     triamterene-hydrochlorothiazide 37.5-25 MG per capsule  Commonly known as:  DYAZIDE  Take 1 each (1 capsule total) by mouth daily.        Meds ordered this encounter  Medications  . rosuvastatin (CRESTOR) 10 MG tablet    Sig: Take 1 tablet (10 mg total) by mouth daily.    Dispense:  90 tablet    Refill:  1    Immunization History  Administered Date(s) Administered  . Influenza,inj,Quad PF,36+ Mos 04/23/2013    Family History  Problem Relation Age of Onset  . CAD  Mother     History  Substance Use Topics  . Smoking status: Never Smoker   . Smokeless tobacco: Never Used  . Alcohol Use: No    Review of Systems   As noted in HPI  Filed Vitals:   04/09/14 1034  BP: 138/78  Pulse: 65  Temp: 98.6 F (37 C)  Resp: 16    Physical Exam  Physical Exam  Eyes: EOM are normal. Pupils are equal, round, and reactive to light.  Cardiovascular: Normal rate and regular rhythm.   Pulmonary/Chest: Breath sounds normal. No respiratory distress. She has no wheezes. She has no rales.  Minimal chest wall tenderness on rib below the left breast     CBC    Component Value Date/Time   WBC 4.1 09/09/2013 1036   RBC 4.29 09/09/2013 1036   HGB 13.0 09/09/2013 1036   HCT 37.9 09/09/2013 1036   PLT 281 09/09/2013 1036   MCV 88.3 09/09/2013 1036   LYMPHSABS 1.2 09/09/2013 1036   MONOABS 0.5  09/09/2013 1036   EOSABS 0.0 09/09/2013 1036   BASOSABS 0.0 09/09/2013 1036    CMP     Component Value Date/Time   NA 140 01/08/2014 0914   K 4.4 01/08/2014 0914   CL 99 01/08/2014 0914   CO2 32 01/08/2014 0914   GLUCOSE 97 01/08/2014 0914   BUN 18 01/08/2014 0914   CREATININE 0.77 01/08/2014 0914   CREATININE 0.75 09/19/2012 1214   CALCIUM 9.3 01/08/2014 0914   PROT 6.8 01/08/2014 0914   ALBUMIN 4.1 01/08/2014 0914   AST 18 01/08/2014 0914   ALT 12 01/08/2014 0914   ALKPHOS 55 01/08/2014 0914   BILITOT 0.3 01/08/2014 0914   GFRNONAA 83 01/08/2014 0914   GFRNONAA >90 09/19/2012 1214   GFRAA >89 01/08/2014 0914   GFRAA >90 09/19/2012 1214    Lab Results  Component Value Date/Time   CHOL 177 01/08/2014  9:14 AM    No components found with this basename: hga1c    Lab Results  Component Value Date/Time   AST 18 01/08/2014  9:14 AM    Assessment and Plan  Essential hypertension - Plan:  Continue current meds, will repeat her blood chemistry COMPLETE METABOLIC PANEL WITH GFR  HLD (hyperlipidemia) - Plan: Continue with her rosuvastatin (CRESTOR) 10 MG tablet, will check fasting Lipid panel  Gastroesophageal reflux disease, esophagitis presence not specified Left ear modification continue with her Zantac.  Rib pain on left side - Plan: Ordered an x-ray DG Ribs Unilateral Left  Screening - Plan: [facility] DIGITAL SCREENING BILATERAL   Health Maintenance  -Mammogram: ordered   Return in about 3 months (around 07/09/2014) for hypertension, hyperipidemia.  Doris Cheadle, MD

## 2014-04-16 ENCOUNTER — Other Ambulatory Visit: Payer: Self-pay | Admitting: Internal Medicine

## 2014-04-16 ENCOUNTER — Ambulatory Visit (HOSPITAL_COMMUNITY)
Admission: RE | Admit: 2014-04-16 | Discharge: 2014-04-16 | Disposition: A | Discharge status: Home / Self Care | Payer: No Typology Code available for payment source | Source: Ambulatory Visit | Attending: Internal Medicine | Admitting: Internal Medicine

## 2014-04-16 DIAGNOSIS — R0781 Pleurodynia: Secondary | ICD-10-CM

## 2014-04-16 DIAGNOSIS — R071 Chest pain on breathing: Secondary | ICD-10-CM | POA: Insufficient documentation

## 2014-04-21 ENCOUNTER — Ambulatory Visit: Payer: No Typology Code available for payment source | Attending: Internal Medicine

## 2014-04-21 ENCOUNTER — Telehealth: Payer: Self-pay | Admitting: *Deleted

## 2014-04-21 DIAGNOSIS — E785 Hyperlipidemia, unspecified: Secondary | ICD-10-CM

## 2014-04-21 DIAGNOSIS — I10 Essential (primary) hypertension: Secondary | ICD-10-CM

## 2014-04-21 LAB — COMPLETE METABOLIC PANEL WITH GFR
ALT: 18 U/L (ref 0–35)
AST: 26 U/L (ref 0–37)
Albumin: 4.8 g/dL (ref 3.5–5.2)
Alkaline Phosphatase: 60 U/L (ref 39–117)
BUN: 16 mg/dL (ref 6–23)
CO2: 31 mEq/L (ref 19–32)
Calcium: 10.2 mg/dL (ref 8.4–10.5)
Chloride: 102 mEq/L (ref 96–112)
Creat: 0.81 mg/dL (ref 0.50–1.10)
GFR, Est African American: >89 mL/min
GFR, Est Non African American: 78 mL/min
Glucose, Bld: 100 mg/dL — ABNORMAL HIGH (ref 70–99)
Potassium: 4.5 mEq/L (ref 3.5–5.3)
Sodium: 142 mEq/L (ref 135–145)
Total Bilirubin: 0.5 mg/dL (ref 0.2–1.2)
Total Protein: 7.3 g/dL (ref 6.0–8.3)

## 2014-04-21 LAB — LIPID PANEL
Cholesterol: 194 mg/dL (ref 0–200)
HDL: 51 mg/dL (ref >39)
LDL Cholesterol: 119 mg/dL — ABNORMAL HIGH (ref 0–99)
Total CHOL/HDL Ratio: 3.8 Ratio
Triglycerides: 118 mg/dL (ref <150)
VLDL: 24 mg/dL (ref 0–40)

## 2014-04-21 NOTE — Telephone Encounter (Signed)
Pt is aware of her xray results. 

## 2014-04-21 NOTE — Telephone Encounter (Signed)
Message copied by Raynelle Chary on Wed Apr 21, 2014  2:38 PM ------      Message from: Doris Cheadle      Created: Fri Apr 16, 2014 11:40 AM       Call and let the patient know that her xray left rib and chest  is reported to [facility] normal ------

## 2014-04-23 ENCOUNTER — Other Ambulatory Visit: Payer: No Typology Code available for payment source

## 2014-05-31 ENCOUNTER — Telehealth: Payer: Self-pay | Admitting: Internal Medicine

## 2014-05-31 NOTE — Telephone Encounter (Signed)
Patient can Danielle Richard given refill  

## 2014-05-31 NOTE — Telephone Encounter (Signed)
Pt's husband calling to request medication refill for citalopram (CELEXA) 20 MG tablet  Please assist   Please f/u

## 2014-05-31 NOTE — Telephone Encounter (Signed)
Pt's husband calling to request medication refill for citalopram (CELEXA) 20 MG tablet Please assist

## 2014-06-01 ENCOUNTER — Other Ambulatory Visit: Payer: Self-pay

## 2014-06-01 ENCOUNTER — Other Ambulatory Visit: Payer: Self-pay | Admitting: Emergency Medicine

## 2014-06-01 ENCOUNTER — Telehealth: Payer: Self-pay

## 2014-06-01 DIAGNOSIS — I10 Essential (primary) hypertension: Secondary | ICD-10-CM

## 2014-06-01 DIAGNOSIS — E785 Hyperlipidemia, unspecified: Secondary | ICD-10-CM

## 2014-06-01 DIAGNOSIS — G4700 Insomnia, unspecified: Secondary | ICD-10-CM

## 2014-06-01 MED ORDER — TRIAMTERENE-HCTZ 37.5-25 MG PO CAPS: 1.0000 | ORAL_CAPSULE | Freq: Every day | ORAL | Status: DC

## 2014-06-01 MED ORDER — TRAZODONE HCL 100 MG PO TABS: 100.0000 mg | ORAL_TABLET | Freq: Every day | ORAL | Status: DC

## 2014-06-01 MED ORDER — CITALOPRAM HYDROBROMIDE 20 MG PO TABS: 20.0000 mg | ORAL_TABLET | Freq: Every morning | ORAL | Status: DC

## 2014-06-01 MED ORDER — ROSUVASTATIN CALCIUM 10 MG PO TABS: 10.0000 mg | ORAL_TABLET | Freq: Every day | ORAL | Status: DC

## 2014-06-01 NOTE — Telephone Encounter (Signed)
Daughter called requesting refills on her moms medications Due to the language barrier her mom can not speak with us without An interpreter [facility] and  Cholesterol medication

## 2014-06-08 ENCOUNTER — Ambulatory Visit: Payer: Self-pay | Attending: Internal Medicine

## 2014-07-09 ENCOUNTER — Ambulatory Visit: Payer: Self-pay | Attending: Internal Medicine | Admitting: Internal Medicine

## 2014-07-09 ENCOUNTER — Encounter: Payer: Self-pay | Admitting: Internal Medicine

## 2014-07-09 VITALS — BP 129/72 | HR 68 | Temp 98.2°F | Resp 16 | Wt 114.8 lb

## 2014-07-09 DIAGNOSIS — E785 Hyperlipidemia, unspecified: Secondary | ICD-10-CM

## 2014-07-09 DIAGNOSIS — Z86010 Personal history of colonic polyps: Secondary | ICD-10-CM | POA: Insufficient documentation

## 2014-07-09 DIAGNOSIS — R7301 Impaired fasting glucose: Secondary | ICD-10-CM

## 2014-07-09 DIAGNOSIS — K219 Gastro-esophageal reflux disease without esophagitis: Secondary | ICD-10-CM

## 2014-07-09 DIAGNOSIS — Z1231 Encounter for screening mammogram for malignant neoplasm of breast: Secondary | ICD-10-CM

## 2014-07-09 DIAGNOSIS — G4700 Insomnia, unspecified: Secondary | ICD-10-CM

## 2014-07-09 DIAGNOSIS — F329 Major depressive disorder, single episode, unspecified: Secondary | ICD-10-CM | POA: Insufficient documentation

## 2014-07-09 DIAGNOSIS — I10 Essential (primary) hypertension: Secondary | ICD-10-CM

## 2014-07-09 NOTE — Progress Notes (Signed)
Patient here with interpreter Patient here for follow up on her HTN and cholesterol

## 2014-07-09 NOTE — Progress Notes (Signed)
MRN: [id number] Name: Danielle Richard  Sex: female Age: 63 y.o. DOB: Mar 16, 1951  Allergies: Tylenol and Aspirin  Chief Complaint  Patient presents with  . Follow-up    HPI: Patient is 63 y.o. female who has history of hypertension hyperlipidemia, impaired fasting glucose, insomnia depression comes today for followup, her blood pressure is well controlled, she denies any acute symptoms denies any headache dizziness chest and shortness of breath patient is up-to-date with flu shot, she had a mammogram done in 2014 and is due.  Past Medical History  Diagnosis Date  . Hypertension   . HLD (hyperlipidemia)   . Depression   . Personal history of colonic polyps   . GERD (gastroesophageal reflux disease)   . Insomnia   . Aortic insufficiency     Past Surgical History  Procedure Laterality Date  . No past surgeries    . Colonoscopy with propofol N/A 09/29/2013    Procedure: COLONOSCOPY WITH PROPOFOL;  Surgeon: Charolett BumpersMartin K Johnson, MD;  Location: [facility];  Service: Endoscopy;  Laterality: N/A;  . Esophagogastroduodenoscopy (egd) with propofol N/A 09/29/2013    Procedure: ESOPHAGOGASTRODUODENOSCOPY (EGD) WITH PROPOFOL;  Surgeon: Charolett BumpersMartin K Johnson, MD;  Location: [facility];  Service: Endoscopy;  Laterality: N/A;      Medication List       This list is accurate as of: 07/09/14 10:35 AM.  Always use your most recent med list.               calcium-vitamin D 500-200 MG-UNIT per tablet  Commonly known as:  OSCAL WITH D  Take 1 tablet by mouth daily with breakfast.     citalopram 20 MG tablet  Commonly known as:  CELEXA  Take 1 tablet (20 mg total) by mouth every morning.     methylPREDNISolone 4 MG tablet  Commonly known as:  MEDROL DOSEPAK  follow package directions     omega-3 acid ethyl esters 1 G capsule  Commonly known as:  LOVAZA  Take 1 capsule (1 g total) by mouth daily.     ranitidine 150 MG tablet  Commonly known as:  ZANTAC  Take 0.5 tablets (75 mg total) by  mouth 2 (two) times daily. Half tablet twice daily     rosuvastatin 10 MG tablet  Commonly known as:  CRESTOR  Take 1 tablet (10 mg total) by mouth daily.     traZODone 100 MG tablet  Commonly known as:  DESYREL  Take 1 tablet (100 mg total) by mouth at bedtime.     triamterene-hydrochlorothiazide 37.5-25 MG per capsule  Commonly known as:  DYAZIDE  Take 1 each (1 capsule total) by mouth daily.        No orders of the defined types were placed in this encounter.    Immunization History  Administered Date(s) Administered  . Influenza,inj,Quad PF,36+ Mos 04/23/2013, 04/09/2014    Family History  Problem Relation Age of Onset  . CAD Mother     History  Substance Use Topics  . Smoking status: Never Smoker   . Smokeless tobacco: Never Used  . Alcohol Use: No    Review of Systems   As noted in HPI  Filed Vitals:   07/09/14 1014  BP: 129/72  Pulse: 68  Temp: 98.2 F (36.8 C)  Resp: 16    Physical Exam  Physical Exam  Constitutional: No distress.  Eyes: EOM are normal. Pupils are equal, round, and reactive to light.  Cardiovascular: Normal rate.   Pulmonary/Chest:  Breath sounds normal. No respiratory distress. She has no wheezes. She has no rales.  Musculoskeletal: She exhibits no edema.    CBC    Component Value Date/Time   WBC 4.1 09/09/2013 1036   RBC 4.29 09/09/2013 1036   HGB 13.0 09/09/2013 1036   HCT 37.9 09/09/2013 1036   PLT 281 09/09/2013 1036   MCV 88.3 09/09/2013 1036   LYMPHSABS 1.2 09/09/2013 1036   MONOABS 0.5 09/09/2013 1036   EOSABS 0.0 09/09/2013 1036   BASOSABS 0.0 09/09/2013 1036    CMP     Component Value Date/Time   NA 142 04/21/2014 0915   K 4.5 04/21/2014 0915   CL 102 04/21/2014 0915   CO2 31 04/21/2014 0915   GLUCOSE 100* 04/21/2014 0915   BUN 16 04/21/2014 0915   CREATININE 0.81 04/21/2014 0915   CREATININE 0.75 09/19/2012 1214   CALCIUM 10.2 04/21/2014 0915   PROT 7.3 04/21/2014 0915   ALBUMIN 4.8 04/21/2014  0915   AST 26 04/21/2014 0915   ALT 18 04/21/2014 0915   ALKPHOS 60 04/21/2014 0915   BILITOT 0.5 04/21/2014 0915   GFRNONAA 78 04/21/2014 0915   GFRNONAA >90 09/19/2012 1214   GFRAA >89 04/21/2014 0915   GFRAA >90 09/19/2012 1214    Lab Results  Component Value Date/Time   CHOL 194 04/21/2014 09:15 AM    No components found for: HGA1C  Lab Results  Component Value Date/Time   AST 26 04/21/2014 09:15 AM    Assessment and Plan  Essential hypertension blood pressure is well controlled, currently on Dyazide,also advise for DASH diet, will check blood chemistry on next visit.  HLD (hyperlipidemia) Continue with Crestor, will repeat lipid panel on the next visit.  Gastroesophageal reflux disease, esophagitis presence not specified Lifestyle modification continue with Zantac.  Insomnia Patient is on trazodone.  Encounter for screening mammogram for breast cancer - Plan: MM DIGITAL SCREENING BILATERAL  IFG (impaired fasting glucose) Advised patient for low carbohydrate diet.  Health Maintenance  -Mammogram: ordered -Vaccinations:  uptodate with the flu shot   Return in about 3 months (around 10/08/2014) for hypertension, hyperipidemia.  Doris CheadleADVANI, Amali Uhls, MD

## 2014-07-09 NOTE — Patient Instructions (Signed)
High Cholesterol High cholesterol refers to having a high level of cholesterol in your blood. Cholesterol is a white, waxy, fat-like protein that your body needs in small amounts. Your liver makes all the cholesterol you need. Excess cholesterol comes from the food you eat. Cholesterol travels in your bloodstream through your blood vessels. If you have high cholesterol, deposits (plaque) may build up on the walls of your blood vessels. This makes the arteries narrower and stiffer. Plaque increases your risk of heart attack and stroke. Work with your health care provider to keep your cholesterol levels in a healthy range. RISK FACTORS Several things can make you more likely to have high cholesterol. These include:   Eating foods high in animal fat (saturated fat) or cholesterol.  Being overweight.  Not getting enough exercise.  Having a family history of high cholesterol. SIGNS AND SYMPTOMS High cholesterol does not cause symptoms. DIAGNOSIS  Your health care provider can do a blood test to check whether you have high cholesterol. If you are older than 20, your health care provider may check your cholesterol every 4-6 years. You may Cryst[place]nn checked more often if you already have high cholesterol or other risk factors for heart disease. The blood test for cholesterol measures the following:  Bad cholesterol (LDL cholesterol). This is the type of cholesterol that causes heart disease. This number should Anam less than 100.  Good cholesterol (HDL cholesterol). This type helps protect against heart disease. A healthy level of HDL cholesterol is 60 or higher.  Total cholesterol. This is the combined number of LDL cholesterol and HDL cholesterol. A healthy number is less than 200. TREATMENT  High cholesterol can Lexani treated with diet changes, lifestyle changes, and medicine.   Diet changes may include eating more whole grains, fruits, vegetables, nuts, and fish. You may also have to cut back on red meat  and foods with a lot of added sugar.  Lifestyle changes may include getting at least 40 minutes of aerobic exercise three times a week. Aerobic exercises include walking, biking, and swimming. Aerobic exercise along with a healthy diet can help you maintain a healthy weight. Lifestyle changes may also include quitting smoking.  If diet and lifestyle changes are not enough to lower your cholesterol, your health care provider may prescribe a statin medicine. This medicine has been shown to lower cholesterol and also lower the risk of heart disease. HOME CARE INSTRUCTIONS  Only take over-the-counter or prescription medicines as directed by your health care provider.   Follow a healthy diet as directed by your health care provider. For instance:   Eat chicken (without skin), fish, veal, shellfish, ground turkey breast, and round or loin cuts of red meat.  Do not eat fried foods and fatty meats, such as hot dogs and salami.   Eat plenty of fruits, such as apples.   Eat plenty of vegetables, such as broccoli, potatoes, and carrots.   Eat beans, peas, and lentils.   Eat grains, such as barley, rice, couscous, and bulgur wheat.   Eat pasta without cream sauces.   Use skim or nonfat milk and low-fat or nonfat yogurt and cheeses. Do not eat or drink whole milk, cream, ice cream, egg yolks, and hard cheeses.   Do not eat stick margarine or tub margarines that contain trans fats (also called partially hydrogenated oils).   Do not eat cakes, cookies, crackers, or other baked goods that contain trans fats.   Do not eat saturated tropical oils, such as   coconut and palm oil.   Exercise as directed by your health care provider. Increase your activity level with activities such as gardening or walking.   Keep all follow-up appointments.  SEEK MEDICAL CARE IF:  You are struggling to maintain a healthy diet or weight.  You need help starting an exercise program.  You need help to  stop smoking. SEEK IMMEDIATE MEDICAL CARE IF:  You have chest pain.  You have trouble breathing. Document Released: 07/23/2005 Document Revised: 12/07/2013 Document Reviewed: 05/15/2013 ExitCare Patient Information 2015 [employer]. This information is not intended to replace advice given to you by your health care provider. Make sure you discuss any questions you have with your health care provider. DASH Eating Plan DASH stands for "Dietary Approaches to Stop Hypertension." The DASH eating plan is a healthy eating plan that has been shown to reduce high blood pressure (hypertension). Additional health benefits may include reducing the risk of type 2 diabetes mellitus, heart disease, and stroke. The DASH eating plan may also help with weight loss. WHAT DO I NEED TO KNOW ABOUT THE DASH EATING PLAN? For the DASH eating plan, you will follow these general guidelines:  Choose foods with a percent daily value for sodium of less than 5% (as listed on the food label).  Use salt-free seasonings or herbs instead of table salt or sea salt.  Check with your health care provider or pharmacist before using salt substitutes.  Eat lower-sodium products, often labeled as "lower sodium" or "no salt added."  Eat fresh foods.  Eat more vegetables, fruits, and low-fat dairy products.  Choose whole grains. Look for the word "whole" as the first word in the ingredient list.  Choose fish and skinless chicken or turkey more often than red meat. Limit fish, poultry, and meat to 6 oz (170 g) each day.  Limit sweets, desserts, sugars, and sugary drinks.  Choose heart-healthy fats.  Limit cheese to 1 oz (28 g) per day.  Eat more home-cooked food and less restaurant, buffet, and fast food.  Limit fried foods.  Cook foods using methods other than frying.  Limit canned vegetables. If you do use them, rinse them well to decrease the sodium.  When eating at a restaurant, ask that your food [place] prepared  with less salt, or no salt if possible. WHAT FOODS CAN I EAT? Seek help from a dietitian for individual calorie needs. Grains Whole grain or whole wheat bread. Brown rice. Whole grain or whole wheat pasta. Quinoa, bulgur, and whole grain cereals. Low-sodium cereals. Corn or whole wheat flour tortillas. Whole grain cornbread. Whole grain crackers. Low-sodium crackers. Vegetables Fresh or frozen vegetables (raw, steamed, roasted, or grilled). Low-sodium or reduced-sodium tomato and vegetable juices. Low-sodium or reduced-sodium tomato sauce and paste. Low-sodium or reduced-sodium canned vegetables.  Fruits All fresh, canned (in natural juice), or frozen fruits. Meat and Other Protein Products Ground beef (85% or leaner), grass-fed beef, or beef trimmed of fat. Skinless chicken or turkey. Ground chicken or turkey. Pork trimmed of fat. All fish and seafood. Eggs. Dried beans, peas, or lentils. Unsalted nuts and seeds. Unsalted canned beans. Dairy Low-fat dairy products, such as skim or 1% milk, 2% or reduced-fat cheeses, low-fat ricotta or cottage cheese, or plain low-fat yogurt. Low-sodium or reduced-sodium cheeses. Fats and Oils Tub margarines without trans fats. Light or reduced-fat mayonnaise and salad dressings (reduced sodium). Avocado. Safflower, olive, or canola oils. Natural peanut or almond butter. Other Unsalted popcorn and pretzels. The items listed above may   not [place] a complete list of recommended foods or beverages. Contact your dietitian for more options. WHAT FOODS ARE NOT RECOMMENDED? Grains White bread. White pasta. White rice. Refined cornbread. Bagels and croissants. Crackers that contain trans fat. Vegetables Creamed or fried vegetables. Vegetables in a cheese sauce. Regular canned vegetables. Regular canned tomato sauce and paste. Regular tomato and vegetable juices. Fruits Dried fruits. Canned fruit in light or heavy syrup. Fruit juice. Meat and Other Protein Products Fatty  cuts of meat. Ribs, chicken wings, bacon, sausage, bologna, salami, chitterlings, fatback, hot dogs, bratwurst, and packaged luncheon meats. Salted nuts and seeds. Canned beans with salt. Dairy Whole or 2% milk, cream, half-and-half, and cream cheese. Whole-fat or sweetened yogurt. Full-fat cheeses or blue cheese. Nondairy creamers and whipped toppings. Processed cheese, cheese spreads, or cheese curds. Condiments Onion and garlic salt, seasoned salt, table salt, and sea salt. Canned and packaged gravies. Worcestershire sauce. Tartar sauce. Barbecue sauce. Teriyaki sauce. Soy sauce, including reduced sodium. Steak sauce. Fish sauce. Oyster sauce. Cocktail sauce. Horseradish. Ketchup and mustard. Meat flavorings and tenderizers. Bouillon cubes. Hot sauce. Tabasco sauce. Marinades. Taco seasonings. Relishes. Fats and Oils Butter, stick margarine, lard, shortening, ghee, and bacon fat. Coconut, palm kernel, or palm oils. Regular salad dressings. Other Pickles and olives. Salted popcorn and pretzels. The items listed above may not [place] a complete list of foods and beverages to avoid. Contact your dietitian for more information. WHERE CAN I FIND MORE INFORMATION? National Heart, Lung, and Blood Institute: CablePromo.itwww.nhlbi.nih.gov/health/health-topics/topics/dash/ Document Released: 07/12/2011 Document Revised: 12/07/2013 Document Reviewed: 05/27/2013 [facility] Authority[facility]Care Patient Information 2015 [place], [place]. This information is not intended to replace advice given to you by your health care provider. Make sure you discuss any questions you have with your health care provider.

## 2014-08-06 DIAGNOSIS — Z9289 Personal history of other medical treatment: Secondary | ICD-10-CM

## 2014-08-06 HISTORY — DX: Personal history of other medical treatment: Z92.89

## 2014-08-26 ENCOUNTER — Ambulatory Visit: Payer: Self-pay | Attending: Internal Medicine

## 2014-08-26 ENCOUNTER — Other Ambulatory Visit: Payer: Self-pay | Admitting: Internal Medicine

## 2014-08-26 NOTE — Telephone Encounter (Signed)
Patient is requesting a med refill for Celexa, please f/u with pt.

## 2014-08-26 NOTE — Telephone Encounter (Signed)
LVM Pt has one refill, please call pharmacy

## 2014-10-04 ENCOUNTER — Other Ambulatory Visit: Payer: Self-pay | Admitting: Emergency Medicine

## 2014-10-04 ENCOUNTER — Telehealth: Payer: Self-pay | Admitting: Internal Medicine

## 2014-10-04 DIAGNOSIS — I10 Essential (primary) hypertension: Secondary | ICD-10-CM

## 2014-10-04 MED ORDER — TRIAMTERENE-HCTZ 37.5-25 MG PO CAPS: 1.0000 | ORAL_CAPSULE | Freq: Every day | ORAL | Status: DC

## 2014-10-04 NOTE — Telephone Encounter (Signed)
Patient has come in today to inquire about a bill; patient also wanted to request a medication refill for triamterene-hydrochlorothiazide (DYAZIDE) 37.5-25 MG per capsule; please f/u with patient about this request

## 2014-10-06 ENCOUNTER — Telehealth: Payer: Self-pay | Admitting: Family Medicine

## 2014-10-06 DIAGNOSIS — G4700 Insomnia, unspecified: Secondary | ICD-10-CM

## 2014-10-06 DIAGNOSIS — I10 Essential (primary) hypertension: Secondary | ICD-10-CM

## 2014-10-06 MED ORDER — TRAZODONE HCL 100 MG PO TABS: 100.0000 mg | ORAL_TABLET | Freq: Every day | ORAL | Status: DC

## 2014-10-06 MED ORDER — TRIAMTERENE-HCTZ 37.5-25 MG PO CAPS: 1.0000 | ORAL_CAPSULE | Freq: Every day | ORAL | Status: DC

## 2014-10-06 NOTE — Telephone Encounter (Signed)
Refilled trazodone and dyazide to on site pharmacy.  Please inform patient.

## 2014-10-13 ENCOUNTER — Encounter: Payer: Self-pay | Admitting: Internal Medicine

## 2014-10-13 ENCOUNTER — Ambulatory Visit: Payer: Self-pay | Attending: Internal Medicine | Admitting: Internal Medicine

## 2014-10-13 VITALS — BP 126/82 | HR 78 | Temp 98.0°F | Resp 16 | Wt 113.4 lb

## 2014-10-13 DIAGNOSIS — R202 Paresthesia of skin: Secondary | ICD-10-CM

## 2014-10-13 DIAGNOSIS — E785 Hyperlipidemia, unspecified: Secondary | ICD-10-CM | POA: Insufficient documentation

## 2014-10-13 DIAGNOSIS — F329 Major depressive disorder, single episode, unspecified: Secondary | ICD-10-CM | POA: Insufficient documentation

## 2014-10-13 DIAGNOSIS — K219 Gastro-esophageal reflux disease without esophagitis: Secondary | ICD-10-CM | POA: Insufficient documentation

## 2014-10-13 DIAGNOSIS — Z86010 Personal history of colonic polyps: Secondary | ICD-10-CM | POA: Insufficient documentation

## 2014-10-13 DIAGNOSIS — R200 Anesthesia of skin: Secondary | ICD-10-CM | POA: Insufficient documentation

## 2014-10-13 DIAGNOSIS — G4700 Insomnia, unspecified: Secondary | ICD-10-CM | POA: Insufficient documentation

## 2014-10-13 DIAGNOSIS — I10 Essential (primary) hypertension: Secondary | ICD-10-CM | POA: Insufficient documentation

## 2014-10-13 LAB — COMPLETE METABOLIC PANEL WITH GFR
ALT: 20 U/L (ref 0–35)
AST: 23 U/L (ref 0–37)
Albumin: 4.6 g/dL (ref 3.5–5.2)
Alkaline Phosphatase: 55 U/L (ref 39–117)
BUN: 23 mg/dL (ref 6–23)
CO2: 29 mEq/L (ref 19–32)
Calcium: 9.7 mg/dL (ref 8.4–10.5)
Chloride: 100 mEq/L (ref 96–112)
Creat: 0.81 mg/dL (ref 0.50–1.10)
GFR, Est African American: >89 mL/min
GFR, Est Non African American: 78 mL/min
Glucose, Bld: 84 mg/dL (ref 70–99)
Potassium: 4.4 mEq/L (ref 3.5–5.3)
Sodium: 141 mEq/L (ref 135–145)
Total Bilirubin: 0.4 mg/dL (ref 0.2–1.2)
Total Protein: 7.6 g/dL (ref 6.0–8.3)

## 2014-10-13 LAB — VITAMIN B12: Vitamin B-12: 633 pg/mL (ref 211–911)

## 2014-10-13 MED ORDER — OMEGA-3-ACID ETHYL ESTERS 1 G PO CAPS: 1.0000 g | ORAL_CAPSULE | Freq: Every day | ORAL | Status: DC

## 2014-10-13 MED ORDER — CALCIUM CARBONATE-VITAMIN D 500-200 MG-UNIT PO TABS: 1.0000 | ORAL_TABLET | Freq: Every day | ORAL | Status: DC

## 2014-10-13 MED ORDER — ROSUVASTATIN CALCIUM 10 MG PO TABS: 10.0000 mg | ORAL_TABLET | Freq: Every day | ORAL | Status: DC

## 2014-10-13 MED ORDER — TRAZODONE HCL 100 MG PO TABS: 100.0000 mg | ORAL_TABLET | Freq: Every day | ORAL | Status: DC

## 2014-10-13 MED ORDER — CITALOPRAM HYDROBROMIDE 20 MG PO TABS: 20.0000 mg | ORAL_TABLET | Freq: Every morning | ORAL | Status: DC

## 2014-10-13 MED ORDER — RANITIDINE HCL 150 MG PO TABS: 75.0000 mg | ORAL_TABLET | Freq: Two times a day (BID) | ORAL | Status: DC

## 2014-10-13 MED ORDER — TRIAMTERENE-HCTZ 37.5-25 MG PO CAPS: 1.0000 | ORAL_CAPSULE | Freq: Every day | ORAL | Status: DC

## 2014-10-13 NOTE — Progress Notes (Signed)
MRN: [id number] Name: Danielle Richard  Sex: female Age: 64 y.o. DOB: [birth year]  Allergies: Tylenol and Aspirin  Chief Complaint  Patient presents with  . Follow-up    HPI: Patient is 64 y.o. female who has to of hypertension hyperlipidemia, GERD, depression comes today for followup she is requesting refill on her medications denies any acute symptoms, she does complain of occasional numbness tingling in her hands,[place] and is requesting medication prescription to [facility] for 3 months.  Past Medical History  Diagnosis Date  . Hypertension   . HLD (hyperlipidemia)   . Depression   . Personal history of colonic polyps   . GERD (gastroesophageal reflux disease)   . Insomnia   . Aortic insufficiency     Past Surgical History  Procedure Laterality Date  . No past surgeries    . Colonoscopy with propofol N/A 09/29/2013    Procedure: COLONOSCOPY WITH PROPOFOL;  Surgeon: Charolett Bumpers, MD;  Location: [facility];  Service: Endoscopy;  Laterality: N/A;  . Esophagogastroduodenoscopy (egd) with propofol N/A 09/29/2013    Procedure: ESOPHAGOGASTRODUODENOSCOPY (EGD) WITH PROPOFOL;  Surgeon: Charolett Bumpers, MD;  Location: [facility];  Service: Endoscopy;  Laterality: N/A;      Medication List       This list is accurate as of: 10/13/14 12:54 PM.  Always use your most recent med list.               calcium-vitamin D 500-200 MG-UNIT per tablet  Commonly known as:  OSCAL WITH D  Take 1 tablet by mouth daily with breakfast.     citalopram 20 MG tablet  Commonly known as:  CELEXA  Take 1 tablet (20 mg total) by mouth every morning.     methylPREDNISolone 4 MG tablet  Commonly known as:  MEDROL DOSEPAK  follow package directions     omega-3 acid ethyl esters 1 G capsule  Commonly known as:  LOVAZA  Take 1 capsule (1 g total) by mouth daily.     ranitidine 150 MG tablet  Commonly known as:  ZANTAC  Take 0.5 tablets (75 mg total) by mouth  2 (two) times daily. Half tablet twice daily     rosuvastatin 10 MG tablet  Commonly known as:  CRESTOR  Take 1 tablet (10 mg total) by mouth daily.     traZODone 100 MG tablet  Commonly known as:  DESYREL  Take 1 tablet (100 mg total) by mouth at bedtime.     triamterene-hydrochlorothiazide 37.5-25 MG per capsule  Commonly known as:  DYAZIDE  Take 1 each (1 capsule total) by mouth daily.        Meds ordered this encounter  Medications  . calcium-vitamin D (OSCAL WITH D) 500-200 MG-UNIT per tablet    Sig: Take 1 tablet by mouth daily with breakfast.    Dispense:  60 tablet    Refill:  3  . citalopram (CELEXA) 20 MG tablet    Sig: Take 1 tablet (20 mg total) by mouth every morning.    Dispense:  60 tablet    Refill:  3  . omega-3 acid ethyl esters (LOVAZA) 1 G capsule    Sig: Take 1 capsule (1 g total) by mouth daily.    Dispense:  90 capsule    Refill:  3  . ranitidine (ZANTAC) 150 MG tablet    Sig: Take 0.5 tablets (75 mg total) by mouth 2 (two) times daily.  Half tablet twice daily    Dispense:  120 tablet    Refill:  2  . rosuvastatin (CRESTOR) 10 MG tablet    Sig: Take 1 tablet (10 mg total) by mouth daily.    Dispense:  90 tablet    Refill:  1  . traZODone (DESYREL) 100 MG tablet    Sig: Take 1 tablet (100 mg total) by mouth at bedtime.    Dispense:  90 tablet    Refill:  1  . triamterene-hydrochlorothiazide (DYAZIDE) 37.5-25 MG per capsule    Sig: Take 1 each (1 capsule total) by mouth daily.    Dispense:  90 capsule    Refill:  3    Immunization History  Administered Date(s) Administered  . Influenza,inj,Quad PF,36+ Mos 04/23/2013, 04/09/2014    Family History  Problem Relation Age of Onset  . CAD Mother     History  Substance Use Topics  . Smoking status: Never Smoker   . Smokeless tobacco: Never Used  . Alcohol Use: No    Review of Systems   As noted in HPI  Filed Vitals:   10/13/14 1150  BP: 126/82  Pulse: 78  Temp: 98 F (36.7 C)    Resp: 16    Physical Exam  Physical Exam  Constitutional: No distress.  Eyes: EOM are normal. Pupils are equal, round, and reactive to light.  Cardiovascular: Normal rate and regular rhythm.   Pulmonary/Chest: Breath sounds normal. No respiratory distress. She has no wheezes. She has no rales.  Musculoskeletal: She exhibits no edema.    CBC    Component Value Date/Time   WBC 4.1 09/09/2013 1036   RBC 4.29 09/09/2013 1036   HGB 13.0 09/09/2013 1036   HCT 37.9 09/09/2013 1036   PLT 281 09/09/2013 1036   MCV 88.3 09/09/2013 1036   LYMPHSABS 1.2 09/09/2013 1036   MONOABS 0.5 09/09/2013 1036   EOSABS 0.0 09/09/2013 1036   BASOSABS 0.0 09/09/2013 1036    CMP     Component Value Date/Time   NA 142 04/21/2014 0915   K 4.5 04/21/2014 0915   CL 102 04/21/2014 0915   CO2 31 04/21/2014 0915   GLUCOSE 100* 04/21/2014 0915   BUN 16 04/21/2014 0915   CREATININE 0.81 04/21/2014 0915   CREATININE 0.75 09/19/2012 1214   CALCIUM 10.2 04/21/2014 0915   PROT 7.3 04/21/2014 0915   ALBUMIN 4.8 04/21/2014 0915   AST 26 04/21/2014 0915   ALT 18 04/21/2014 0915   ALKPHOS 60 04/21/2014 0915   BILITOT 0.5 04/21/2014 0915   GFRNONAA 78 04/21/2014 0915   GFRNONAA >90 09/19/2012 1214   GFRAA >89 04/21/2014 0915   GFRAA >90 09/19/2012 1214    Lab Results  Component Value Date/Time   CHOL 194 04/21/2014 09:15 AM    No components found for: HGA1C  Lab Results  Component Value Date/Time   AST 26 04/21/2014 09:15 AM    Assessment and Plan  Dyslipidemia - Plan: omega-3 acid ethyl esters (LOVAZA) 1 G capsule  Gastroesophageal reflux disease without esophagitis - Plan:were modification, continue with ranitidine (ZANTAC) 150 MG tablet  HLD (hyperlipidemia) - Plan: rosuvastatin (CRESTOR) 10 MG tablet, we'll check fasting lipid panel on next visit.  Insomnia - Plan: traZODone (DESYREL) 100 MG tablet  Essential hypertension - Plan:blood pressure is well controlled, continue with   triamterene-hydrochlorothiazide (DYAZIDE) 37.5-25 MG per capsule, COMPLETE METABOLIC PANEL WITH GFR  Numbness and tingling - Plan: Vitamin B12   Health Maintenance  -Vaccinations:  Up-to-date with flu shot  Return in about 3 months (around 01/13/2015) for hypertension.   This note has been created with Education officer, environmental. Any transcriptional errors are unintentional.    Doris Cheadle, MD

## 2014-10-13 NOTE — Progress Notes (Signed)
Patient here for follow up on her HTN and for medication refills Patient is going out of the country for five weeks so will need greater than a  One month supply

## 2014-10-21 ENCOUNTER — Telehealth: Payer: Self-pay

## 2014-10-21 NOTE — Telephone Encounter (Signed)
-----   Message from Doris Cheadleeepak Advani, MD sent at 10/14/2014 11:45 AM EST ----- Call and let the Patient know that blood work is normal.

## 2014-10-21 NOTE — Telephone Encounter (Signed)
Interpreter line used Message left on machine to return our call

## 2014-12-06 ENCOUNTER — Other Ambulatory Visit: Payer: Self-pay

## 2015-01-10 ENCOUNTER — Ambulatory Visit: Payer: Self-pay | Attending: Internal Medicine | Admitting: Internal Medicine

## 2015-01-10 ENCOUNTER — Encounter: Payer: Self-pay | Admitting: Internal Medicine

## 2015-01-10 VITALS — BP 129/85 | HR 62 | Temp 98.0°F | Resp 16 | Wt 116.0 lb

## 2015-01-10 DIAGNOSIS — I10 Essential (primary) hypertension: Secondary | ICD-10-CM

## 2015-01-10 DIAGNOSIS — J029 Acute pharyngitis, unspecified: Secondary | ICD-10-CM

## 2015-01-10 DIAGNOSIS — E785 Hyperlipidemia, unspecified: Secondary | ICD-10-CM

## 2015-01-10 DIAGNOSIS — K219 Gastro-esophageal reflux disease without esophagitis: Secondary | ICD-10-CM | POA: Insufficient documentation

## 2015-01-10 DIAGNOSIS — G4700 Insomnia, unspecified: Secondary | ICD-10-CM

## 2015-01-10 DIAGNOSIS — Z86010 Personal history of colonic polyps: Secondary | ICD-10-CM | POA: Insufficient documentation

## 2015-01-10 DIAGNOSIS — F329 Major depressive disorder, single episode, unspecified: Secondary | ICD-10-CM | POA: Insufficient documentation

## 2015-01-10 DIAGNOSIS — M2550 Pain in unspecified joint: Secondary | ICD-10-CM

## 2015-01-10 DIAGNOSIS — M25649 Stiffness of unspecified hand, not elsewhere classified: Secondary | ICD-10-CM

## 2015-01-10 MED ORDER — TRAZODONE HCL 100 MG PO TABS: 100.0000 mg | ORAL_TABLET | Freq: Every day | ORAL | Status: DC

## 2015-01-10 MED ORDER — DOXYCYCLINE HYCLATE 100 MG PO TABS: 100.0000 mg | ORAL_TABLET | Freq: Two times a day (BID) | ORAL | Status: DC

## 2015-01-10 NOTE — Progress Notes (Signed)
Patient here with interpreter Complains of joint pain Chronic lower back pain and some SOB

## 2015-01-10 NOTE — Progress Notes (Signed)
MRN: [id number] Name: Danielle Richard  Sex: female Age: 64 y.o. DOB: 1951/04/06  Allergies: Tylenol and Aspirin  Chief Complaint  Patient presents with  . Follow-up    HPI: Patient is 64 y.o. female who has history of hyperlipidemia, depression, hypertension, insomnia comes today for followup requesting refill on her medications, she also reports stiffness in her hand joints, also multiple joint pain, patient is not sure any family history of rheumatoid arthritis, she is also complaining of sore throat denies any fever chills, denies any cough chest pain shortness of breath.patient will like to do some blood work for cholesterol but she has already eaten today.  Past Medical History  Diagnosis Date  . Hypertension   . HLD (hyperlipidemia)   . Depression   . Personal history of colonic polyps   . GERD (gastroesophageal reflux disease)   . Insomnia   . Aortic insufficiency     Past Surgical History  Procedure Laterality Date  . No past surgeries    . Colonoscopy with propofol N/A 09/29/2013    Procedure: COLONOSCOPY WITH PROPOFOL;  Surgeon: Charolett Bumpers, MD;  Location: [facility];  Service: Endoscopy;  Laterality: N/A;  . Esophagogastroduodenoscopy (egd) with propofol N/A 09/29/2013    Procedure: ESOPHAGOGASTRODUODENOSCOPY (EGD) WITH PROPOFOL;  Surgeon: Charolett Bumpers, MD;  Location: [facility];  Service: Endoscopy;  Laterality: N/A;      Medication List       This list is accurate as of: 01/10/15 12:45 PM.  Always use your most recent med list.               calcium-vitamin D 500-200 MG-UNIT per tablet  Commonly known as:  OSCAL WITH D  Take 1 tablet by mouth daily with breakfast.     citalopram 20 MG tablet  Commonly known as:  CELEXA  Take 1 tablet (20 mg total) by mouth every morning.     doxycycline 100 MG tablet  Commonly known as:  VIBRA-TABS  Take 1 tablet (100 mg total) by mouth 2 (two) times daily.     methylPREDNISolone 4 MG tablet  Commonly  known as:  MEDROL DOSEPAK  follow package directions     omega-3 acid ethyl esters 1 G capsule  Commonly known as:  LOVAZA  Take 1 capsule (1 g total) by mouth daily.     ranitidine 150 MG tablet  Commonly known as:  ZANTAC  Take 0.5 tablets (75 mg total) by mouth 2 (two) times daily. Half tablet twice daily     rosuvastatin 10 MG tablet  Commonly known as:  CRESTOR  Take 1 tablet (10 mg total) by mouth daily.     traZODone 100 MG tablet  Commonly known as:  DESYREL  Take 1 tablet (100 mg total) by mouth at bedtime.     triamterene-hydrochlorothiazide 37.5-25 MG per capsule  Commonly known as:  DYAZIDE  Take 1 each (1 capsule total) by mouth daily.        Meds ordered this encounter  Medications  . traZODone (DESYREL) 100 MG tablet    Sig: Take 1 tablet (100 mg total) by mouth at bedtime.    Dispense:  90 tablet    Refill:  1  . doxycycline (VIBRA-TABS) 100 MG tablet    Sig: Take 1 tablet (100 mg total) by mouth 2 (two) times daily.    Dispense:  14 tablet    Refill:  0    Immunization History  Administered Date(s) Administered  .  Influenza,inj,Quad PF,36+ Mos 04/23/2013, 04/09/2014    Family History  Problem Relation Age of Onset  . CAD Mother     History  Substance Use Topics  . Smoking status: Never Smoker   . Smokeless tobacco: Never Used  . Alcohol Use: No    Review of Systems   As noted in HPI  Filed Vitals:   01/10/15 1217  BP: 129/85  Pulse: 62  Temp: 98 F (36.7 C)  Resp: 16    Physical Exam  Physical Exam  Constitutional: No distress.  HENT:  Pharyngeal erythema, no exudate  Eyes: EOM are normal. Pupils are equal, round, and reactive to light.  Cardiovascular: Normal rate and regular rhythm.   Pulmonary/Chest: Breath sounds normal. No respiratory distress. She has no wheezes. She has no rales.  Musculoskeletal: She exhibits no edema.    CBC    Component Value Date/Time   WBC 4.1 09/09/2013 1036   RBC 4.29 09/09/2013 1036     HGB 13.0 09/09/2013 1036   HCT 37.9 09/09/2013 1036   PLT 281 09/09/2013 1036   MCV 88.3 09/09/2013 1036   LYMPHSABS 1.2 09/09/2013 1036   MONOABS 0.5 09/09/2013 1036   EOSABS 0.0 09/09/2013 1036   BASOSABS 0.0 09/09/2013 1036    CMP     Component Value Date/Time   NA 141 10/13/2014 1226   K 4.4 10/13/2014 1226   CL 100 10/13/2014 1226   CO2 29 10/13/2014 1226   GLUCOSE 84 10/13/2014 1226   BUN 23 10/13/2014 1226   CREATININE 0.81 10/13/2014 1226   CREATININE 0.75 09/19/2012 1214   CALCIUM 9.7 10/13/2014 1226   PROT 7.6 10/13/2014 1226   ALBUMIN 4.6 10/13/2014 1226   AST 23 10/13/2014 1226   ALT 20 10/13/2014 1226   ALKPHOS 55 10/13/2014 1226   BILITOT 0.4 10/13/2014 1226   GFRNONAA 78 10/13/2014 1226   GFRNONAA >90 09/19/2012 1214   GFRAA >89 10/13/2014 1226   GFRAA >90 09/19/2012 1214    Lab Results  Component Value Date/Time   CHOL 194 04/21/2014 09:15 AM    Lab Results  Component Value Date/Time   HGBA1C 6.0* 07/16/2013 09:17 AM    Lab Results  Component Value Date/Time   AST 23 10/13/2014 12:26 PM    Assessment and Plan  Essential hypertension - Plan: blood pressure is controlled continue with current meds COMPLETE METABOLIC PANEL WITH GFR  Dyslipidemia - Plan: we'll check fasting Lipid panel  Insomnia - Plan: traZODone (DESYREL) 100 MG tablet  Morning joint stiffness of finger or hand, unspecified laterality/Multiple joint pain  - Plan:have ordered  Rheumatoid factor, ANA, Cyclic citrul peptide antibody, IgG, Uric acid  Pharyngitis - Plan: doxycycline (VIBRA-TABS) 100 MG tablet   Return in about 3 months (around 04/12/2015), or if symptoms worsen or fail to improve, for hypertension, fasting lab work on 01/17/2015.   This note has been created with Education officer, environmentalDragon speech recognition software and smart phrase technology. Any transcriptional errors are unintentional.    Doris CheadleADVANI, Kebin Maye, MD

## 2015-01-17 ENCOUNTER — Ambulatory Visit: Payer: Self-pay | Attending: Internal Medicine

## 2015-01-17 DIAGNOSIS — M25649 Stiffness of unspecified hand, not elsewhere classified: Secondary | ICD-10-CM

## 2015-01-17 DIAGNOSIS — E785 Hyperlipidemia, unspecified: Secondary | ICD-10-CM

## 2015-01-17 DIAGNOSIS — I10 Essential (primary) hypertension: Secondary | ICD-10-CM

## 2015-01-17 LAB — COMPLETE METABOLIC PANEL WITH GFR
ALT: 52 U/L — ABNORMAL HIGH (ref 0–35)
AST: 34 U/L (ref 0–37)
Albumin: 4.5 g/dL (ref 3.5–5.2)
Alkaline Phosphatase: 59 U/L (ref 39–117)
BUN: 23 mg/dL (ref 6–23)
CO2: 25 mEq/L (ref 19–32)
Calcium: 9.9 mg/dL (ref 8.4–10.5)
Chloride: 102 mEq/L (ref 96–112)
Creat: 0.74 mg/dL (ref 0.50–1.10)
GFR, Est African American: >89 mL/min
GFR, Est Non African American: 86 mL/min
Glucose, Bld: 104 mg/dL — ABNORMAL HIGH (ref 70–99)
Potassium: 4.6 mEq/L (ref 3.5–5.3)
Sodium: 143 mEq/L (ref 135–145)
Total Bilirubin: 0.3 mg/dL (ref 0.2–1.2)
Total Protein: 7.5 g/dL (ref 6.0–8.3)

## 2015-01-17 LAB — RHEUMATOID FACTOR: Rhuematoid fact SerPl-aCnc: <10 IU/mL (ref <=14)

## 2015-01-17 LAB — LIPID PANEL
Cholesterol: 230 mg/dL — ABNORMAL HIGH (ref 0–200)
HDL: 36 mg/dL — ABNORMAL LOW (ref >=46)
LDL Cholesterol: 126 mg/dL — ABNORMAL HIGH (ref 0–99)
Total CHOL/HDL Ratio: 6.4 Ratio
Triglycerides: 339 mg/dL — ABNORMAL HIGH (ref <150)
VLDL: 68 mg/dL — ABNORMAL HIGH (ref 0–40)

## 2015-01-17 LAB — URIC ACID: Uric Acid, Serum: 6.7 mg/dL (ref 2.4–7.0)

## 2015-01-18 LAB — CYCLIC CITRUL PEPTIDE ANTIBODY, IGG: Cyclic Citrullin Peptide Ab: <2 U/mL (ref 0.0–5.0)

## 2015-01-18 LAB — ANTI-NUCLEAR AB-TITER (ANA TITER): ANA Titer 1: 1:40 {titer} — ABNORMAL HIGH

## 2015-01-18 LAB — ANA: Anti Nuclear Antibody(ANA): POSITIVE — AB

## 2015-01-20 ENCOUNTER — Telehealth: Payer: Self-pay

## 2015-01-20 NOTE — Telephone Encounter (Signed)
-----   Message from Doris Cheadle, MD sent at 01/18/2015  4:16 PM EDT ----- Call and let the patient know that her cholesterol/triglycerides are elevated, advise patient for compliance with medication Crestor , advise patient for low fat diet. We'll check fasting lipid panel on the next visit Also patient's ANA is positive but the titer is not significantly high, her rheumatoid test is negative, will observe for now, if she has worsening symptoms of arthritis, she can [place] referred to rheumatologist.

## 2015-01-20 NOTE — Telephone Encounter (Signed)
Interpreter line used Patient not available Message was left on machine to return  call

## 2015-02-23 ENCOUNTER — Ambulatory Visit: Payer: Self-pay | Attending: Internal Medicine

## 2015-03-09 ENCOUNTER — Other Ambulatory Visit: Payer: Self-pay | Admitting: Pharmacist

## 2015-03-09 ENCOUNTER — Ambulatory Visit: Payer: Self-pay | Attending: Internal Medicine | Admitting: Internal Medicine

## 2015-03-09 ENCOUNTER — Encounter: Payer: Self-pay | Admitting: Internal Medicine

## 2015-03-09 VITALS — BP 127/79 | HR 90 | Temp 98.0°F | Resp 16 | Wt 114.2 lb

## 2015-03-09 DIAGNOSIS — F329 Major depressive disorder, single episode, unspecified: Secondary | ICD-10-CM | POA: Insufficient documentation

## 2015-03-09 DIAGNOSIS — E785 Hyperlipidemia, unspecified: Secondary | ICD-10-CM | POA: Insufficient documentation

## 2015-03-09 DIAGNOSIS — R21 Rash and other nonspecific skin eruption: Secondary | ICD-10-CM | POA: Insufficient documentation

## 2015-03-09 DIAGNOSIS — L299 Pruritus, unspecified: Secondary | ICD-10-CM | POA: Insufficient documentation

## 2015-03-09 DIAGNOSIS — K219 Gastro-esophageal reflux disease without esophagitis: Secondary | ICD-10-CM | POA: Insufficient documentation

## 2015-03-09 DIAGNOSIS — Z1231 Encounter for screening mammogram for malignant neoplasm of breast: Secondary | ICD-10-CM

## 2015-03-09 DIAGNOSIS — R768 Other specified abnormal immunological findings in serum: Secondary | ICD-10-CM

## 2015-03-09 DIAGNOSIS — Z79899 Other long term (current) drug therapy: Secondary | ICD-10-CM | POA: Insufficient documentation

## 2015-03-09 DIAGNOSIS — M2550 Pain in unspecified joint: Secondary | ICD-10-CM | POA: Insufficient documentation

## 2015-03-09 DIAGNOSIS — I10 Essential (primary) hypertension: Secondary | ICD-10-CM | POA: Insufficient documentation

## 2015-03-09 MED ORDER — ROSUVASTATIN CALCIUM 10 MG PO TABS: 10.0000 mg | ORAL_TABLET | Freq: Every day | ORAL | Status: DC

## 2015-03-09 MED ORDER — CETIRIZINE HCL 10 MG PO TABS: 10.0000 mg | ORAL_TABLET | Freq: Every day | ORAL | Status: DC

## 2015-03-09 MED ORDER — METHYLPREDNISOLONE 4 MG PO TBPK: ORAL_TABLET | ORAL | Status: DC

## 2015-03-09 MED ORDER — ATORVASTATIN CALCIUM 40 MG PO TABS: 40.0000 mg | ORAL_TABLET | Freq: Every day | ORAL | Status: DC

## 2015-03-09 MED ORDER — TRIAMTERENE-HCTZ 37.5-25 MG PO CAPS: 1.0000 | ORAL_CAPSULE | Freq: Every day | ORAL | Status: DC

## 2015-03-09 MED ORDER — GABAPENTIN 300 MG PO CAPS: 300.0000 mg | ORAL_CAPSULE | Freq: Three times a day (TID) | ORAL | Status: DC

## 2015-03-09 NOTE — Progress Notes (Signed)
MRN: [id number] Name: Danielle Richard  Sex: female Age: 64 y.o. DOB: 17-Aug-1950  Allergies: Tylenol and Aspirin  Chief Complaint  Patient presents with  . Rash    HPI: Patient is 64 y.o. female who  history of hypertension, multiple joint pain, hyperlipidemia, comes today complaining of noticing a rash on her left side of the trunk as per patient he started like burning sensation itching and then she developed a few vesicles which is now resolved has some scab formation ,patient does report personal history of chickenpox. she does complain of some fever chills, has of GERD complaining of some dyspepsia denies any nausea vomiting change in bowel habits denies any chest pain or shortness of breath, patient also had a blood work done which was reviewed with the patient noticed any headache positive patient does complain of stiffness in the hand joints.  Past Medical History  Diagnosis Date  . Hypertension   . HLD (hyperlipidemia)   . Depression   . Personal history of colonic polyps   . GERD (gastroesophageal reflux disease)   . Insomnia   . Aortic insufficiency     Past Surgical History  Procedure Laterality Date  . No past surgeries    . Colonoscopy with propofol N/A 09/29/2013    Procedure: COLONOSCOPY WITH PROPOFOL;  Surgeon: Charolett Bumpers, MD;  Location: [facility];  Service: Endoscopy;  Laterality: N/A;  . Esophagogastroduodenoscopy (egd) with propofol N/A 09/29/2013    Procedure: ESOPHAGOGASTRODUODENOSCOPY (EGD) WITH PROPOFOL;  Surgeon: Charolett Bumpers, MD;  Location: [facility];  Service: Endoscopy;  Laterality: N/A;      Medication List       This list is accurate as of: 03/09/15  3:37 PM.  Always use your most recent med list.               calcium-vitamin D 500-200 MG-UNIT per tablet  Commonly known as:  OSCAL WITH D  Take 1 tablet by mouth daily with breakfast.     cetirizine 10 MG tablet  Commonly known as:  ZYRTEC  Take 1 tablet (10 mg total) by mouth  daily.     citalopram 20 MG tablet  Commonly known as:  CELEXA  Take 1 tablet (20 mg total) by mouth every morning.     doxycycline 100 MG tablet  Commonly known as:  VIBRA-TABS  Take 1 tablet (100 mg total) by mouth 2 (two) times daily.     gabapentin 300 MG capsule  Commonly known as:  NEURONTIN  Take 1 capsule (300 mg total) by mouth 3 (three) times daily.     methylPREDNISolone 4 MG tablet  Commonly known as:  MEDROL DOSEPAK  follow package directions     methylPREDNISolone 4 MG Tbpk tablet  Commonly known as:  MEDROL DOSEPAK  follow package directions     omega-3 acid ethyl esters 1 G capsule  Commonly known as:  LOVAZA  Take 1 capsule (1 g total) by mouth daily.     ranitidine 150 MG tablet  Commonly known as:  ZANTAC  Take 0.5 tablets (75 mg total) by mouth 2 (two) times daily. Half tablet twice daily     rosuvastatin 10 MG tablet  Commonly known as:  CRESTOR  Take 1 tablet (10 mg total) by mouth daily.     traZODone 100 MG tablet  Commonly known as:  DESYREL  Take 1 tablet (100 mg total) by mouth at bedtime.     triamterene-hydrochlorothiazide 37.5-25 MG per capsule  Commonly known as:  DYAZIDE  Take 1 each (1 capsule total) by mouth daily.        Meds ordered this encounter  Medications  . methylPREDNISolone (MEDROL DOSEPAK) 4 MG TBPK tablet    Sig: follow package directions    Dispense:  21 tablet    Refill:  0  . rosuvastatin (CRESTOR) 10 MG tablet    Sig: Take 1 tablet (10 mg total) by mouth daily.    Dispense:  90 tablet    Refill:  1  . triamterene-hydrochlorothiazide (DYAZIDE) 37.5-25 MG per capsule    Sig: Take 1 each (1 capsule total) by mouth daily.    Dispense:  90 capsule    Refill:  3  . cetirizine (ZYRTEC) 10 MG tablet    Sig: Take 1 tablet (10 mg total) by mouth daily.    Dispense:  30 tablet    Refill:  3  . gabapentin (NEURONTIN) 300 MG capsule    Sig: Take 1 capsule (300 mg total) by mouth 3 (three) times daily.    Dispense:   90 capsule    Refill:  3    Immunization History  Administered Date(s) Administered  . Influenza,inj,Quad PF,36+ Mos 04/23/2013, 04/09/2014    Family History  Problem Relation Age of Onset  . CAD Mother     History  Substance Use Topics  . Smoking status: Never Smoker   . Smokeless tobacco: Never Used  . Alcohol Use: No    Review of Systems   As noted in HPI  Filed Vitals:   03/09/15 1459  BP: 127/79  Pulse: 90  Temp: 98 F (36.7 C)  Resp: 16    Physical Exam  Physical Exam  Constitutional: No distress.  Cardiovascular: Normal rate and regular rhythm.   Pulmonary/Chest: Breath sounds normal. No respiratory distress. She has no wheezes. She has no rales.  Abdominal: Soft. There is no tenderness.  Skin:  Patient has a erythematous rash on the left side of the trunk crossing the currently no vesicles, has scab formation.    Labs   Lab Results  Component Value Date   WBC 4.1 09/09/2013   HGB 13.0 09/09/2013   HCT 37.9 09/09/2013   PLT 281 09/09/2013   GLUCOSE 104* 01/17/2015   CHOL 230* 01/17/2015   TRIG 339* 01/17/2015   HDL 36* 01/17/2015   LDLCALC 126* 01/17/2015   ALT 52* 01/17/2015   AST 34 01/17/2015   NA 143 01/17/2015   K 4.6 01/17/2015   CL 102 01/17/2015   CREATININE 0.74 01/17/2015   BUN 23 01/17/2015   CO2 25 01/17/2015   TSH 0.858 02/25/2013   HGBA1C 6.0* 07/16/2013   MICROALBUR 0.50 12/22/2009    Lab Results  Component Value Date   HGBA1C 6.0* 07/16/2013   HGBA1C 5.9* 09/19/2012     Assessment and Plan  Rash and nonspecific skin eruption - Plan: ? Shingles, the rash is dried, no currently vesicles, will prescribe  methylPREDNISolone (MEDROL DOSEPAK) 4 MG TBPK tablet, gabapentin (NEURONTIN) 300 MG capsule for the pain  Itching - Plan: cetirizine (ZYRTEC) 10 MG tablet  HLD (hyperlipidemia) - Plan:continue with  rosuvastatin (CRESTOR) 10 MG tablet  Essential hypertension - Plan: triamterene-hydrochlorothiazide (DYAZIDE)  37.5-25 MG per capsule, check blood chemistry on the following visit  ANA positive/Multiple joint pain  - Plan: Ambulatory referral to Rheumatology   Health Maintenance  -Mammogram:ordered    Return in about 3 months (around 06/09/2015), or if symptoms worsen or fail to  improve.   This note has been created with Surveyor, quantity. Any transcriptional errors are unintentional.    Lorayne Marek, MD

## 2015-03-09 NOTE — Progress Notes (Signed)
Patient here with interpreter Complains of having a painful rash under her left breat that Started about ten days ago Patient also complaining of epigastric pain

## 2015-03-09 NOTE — Patient Instructions (Signed)
DASH Eating Plan °DASH stands for "Dietary Approaches to Stop Hypertension." The DASH eating plan is a healthy eating plan that has been shown to reduce high blood pressure (hypertension). Additional health benefits may include reducing the risk of type 2 diabetes mellitus, heart disease, and stroke. The DASH eating plan may also help with weight loss. °WHAT DO I NEED TO KNOW ABOUT THE DASH EATING PLAN? °For the DASH eating plan, you will follow these general guidelines: °· Choose foods with a percent daily value for sodium of less than 5% (as listed on the food label). °· Use salt-free seasonings or herbs instead of table salt or sea salt. °· Check with your health care provider or pharmacist before using salt substitutes. °· Eat lower-sodium products, often labeled as "lower sodium" or "no salt added." °· Eat fresh foods. °· Eat more vegetables, fruits, and low-fat dairy products. °· Choose whole grains. Look for the word "whole" as the first word in the ingredient list. °· Choose fish and skinless chicken or turkey more often than red meat. Limit fish, poultry, and meat to 6 oz (170 g) each day. °· Limit sweets, desserts, sugars, and sugary drinks. °· Choose heart-healthy fats. °· Limit cheese to 1 oz (28 g) per day. °· Eat more home-cooked food and less restaurant, buffet, and fast food. °· Limit fried foods. °· Cook foods using methods other than frying. °· Limit canned vegetables. If you do use them, rinse them well to decrease the sodium. °· When eating at a restaurant, ask that your food Maleeha prepared with less salt, or no salt if possible. °WHAT FOODS CAN I EAT? °Seek help from a dietitian for individual calorie needs. °Grains °Whole grain or whole wheat bread. Brown rice. Whole grain or whole wheat pasta. Quinoa, bulgur, and whole grain cereals. Low-sodium cereals. Corn or whole wheat flour tortillas. Whole grain cornbread. Whole grain crackers. Low-sodium crackers. °Vegetables °Fresh or frozen vegetables  (raw, steamed, roasted, or grilled). Low-sodium or reduced-sodium tomato and vegetable juices. Low-sodium or reduced-sodium tomato sauce and paste. Low-sodium or reduced-sodium canned vegetables.  °Fruits °All fresh, canned (in natural juice), or frozen fruits. °Meat and Other Protein Products °Ground beef (85% or leaner), grass-fed beef, or beef trimmed of fat. Skinless chicken or turkey. Ground chicken or turkey. Pork trimmed of fat. All fish and seafood. Eggs. Dried beans, peas, or lentils. Unsalted nuts and seeds. Unsalted canned beans. °Dairy °Low-fat dairy products, such as skim or 1% milk, 2% or reduced-fat cheeses, low-fat ricotta or cottage cheese, or plain low-fat yogurt. Low-sodium or reduced-sodium cheeses. °Fats and Oils °Tub margarines without trans fats. Light or reduced-fat mayonnaise and salad dressings (reduced sodium). Avocado. Safflower, olive, or canola oils. Natural peanut or almond butter. °Other °Unsalted popcorn and pretzels. °The items listed above may not Bradi a complete list of recommended foods or beverages. Contact your dietitian for more options. °WHAT FOODS ARE NOT RECOMMENDED? °Grains °White bread. White pasta. White rice. Refined cornbread. Bagels and croissants. Crackers that contain trans fat. °Vegetables °Creamed or fried vegetables. Vegetables in a cheese sauce. Regular canned vegetables. Regular canned tomato sauce and paste. Regular tomato and vegetable juices. °Fruits °Dried fruits. Canned fruit in light or heavy syrup. Fruit juice. °Meat and Other Protein Products °Fatty cuts of meat. Ribs, chicken wings, bacon, sausage, bologna, salami, chitterlings, fatback, hot dogs, bratwurst, and packaged luncheon meats. Salted nuts and seeds. Canned beans with salt. °Dairy °Whole or 2% milk, cream, half-and-half, and cream cheese. Whole-fat or sweetened yogurt. Full-fat   cheeses or blue cheese. Nondairy creamers and whipped toppings. Processed cheese, cheese spreads, or cheese  curds. °Condiments °Onion and garlic salt, seasoned salt, table salt, and sea salt. Canned and packaged gravies. Worcestershire sauce. Tartar sauce. Barbecue sauce. Teriyaki sauce. Soy sauce, including reduced sodium. Steak sauce. Fish sauce. Oyster sauce. Cocktail sauce. Horseradish. Ketchup and mustard. Meat flavorings and tenderizers. Bouillon cubes. Hot sauce. Tabasco sauce. Marinades. Taco seasonings. Relishes. °Fats and Oils °Butter, stick margarine, lard, shortening, ghee, and bacon fat. Coconut, palm kernel, or palm oils. Regular salad dressings. °Other °Pickles and olives. Salted popcorn and pretzels. °The items listed above may not Juanell a complete list of foods and beverages to avoid. Contact your dietitian for more information. °WHERE CAN I FIND MORE INFORMATION? °National Heart, Lung, and [employer] Institute: www.nhlbi.nih.gov/health/health-topics/topics/dash/ °Document Released: 07/12/2011 Document Revised: 12/07/2013 Document Reviewed: 05/27/2013 °ExitCare® Patient Information ©2015 [employer]. This information is not intended to replace advice given to you by your health care provider. Make sure you discuss any questions you have with your health care provider. ° °

## 2015-03-17 ENCOUNTER — Ambulatory Visit: Payer: Self-pay

## 2015-03-28 ENCOUNTER — Other Ambulatory Visit: Payer: Self-pay | Admitting: Internal Medicine

## 2015-03-28 ENCOUNTER — Other Ambulatory Visit (HOSPITAL_COMMUNITY): Payer: Self-pay | Admitting: Internal Medicine

## 2015-03-28 DIAGNOSIS — Z1231 Encounter for screening mammogram for malignant neoplasm of breast: Secondary | ICD-10-CM

## 2015-04-07 ENCOUNTER — Other Ambulatory Visit: Payer: Self-pay

## 2015-04-12 ENCOUNTER — Ambulatory Visit (HOSPITAL_COMMUNITY): Payer: Self-pay

## 2015-04-13 ENCOUNTER — Ambulatory Visit (HOSPITAL_COMMUNITY)
Admission: RE | Admit: 2015-04-13 | Discharge: 2015-04-13 | Disposition: A | Discharge status: Home / Self Care | Payer: Self-pay | Source: Ambulatory Visit | Attending: Internal Medicine | Admitting: Internal Medicine

## 2015-04-13 DIAGNOSIS — Z1231 Encounter for screening mammogram for malignant neoplasm of breast: Secondary | ICD-10-CM

## 2015-04-20 ENCOUNTER — Telehealth: Payer: Self-pay | Admitting: Internal Medicine

## 2015-04-20 NOTE — Telephone Encounter (Signed)
Refill request for Crestor  90 tab, patient uses [facility]. Please f/u

## 2015-05-16 ENCOUNTER — Other Ambulatory Visit: Payer: Self-pay

## 2015-05-31 ENCOUNTER — Telehealth: Payer: Self-pay

## 2015-05-31 NOTE — Telephone Encounter (Signed)
Nurse spoke to daughter, Luciano CutterHahn as requested in chart. Daughter confirmed patients date of birth. Daughter aware of screening mammogram showing no evidence of malignancy.  Daughter questioned when patient needs to Jannie seen again per provider.  Nurse read last OV notes, patient to return on 06/09/15 or after.  Nurse transferred patient to front office staff to schedule appointment.

## 2015-05-31 NOTE — Telephone Encounter (Signed)
-----   Message from Quentin Angstlugbemiga E Jegede, MD sent at 04/22/2015  9:56 AM EDT ----- Please inform patient that her screening mammogram shows no evidence of malignancy.

## 2015-07-04 ENCOUNTER — Ambulatory Visit: Payer: Self-pay | Attending: Family Medicine

## 2015-07-04 DIAGNOSIS — Z0000 Encounter for general adult medical examination without abnormal findings: Secondary | ICD-10-CM

## 2015-07-04 NOTE — Progress Notes (Signed)
Patient here for flu vaccine. Patient reports feeling good today.  Patient has rash on left arm, itches.  Patient wants to wait to see provider for rash before getting flu shot.  Patient was offered appointment tomorrow. Patient requested appointment next week. Patient will Danielle Richard seen next Wednesday.

## 2015-07-11 ENCOUNTER — Ambulatory Visit: Payer: Self-pay

## 2015-07-13 ENCOUNTER — Ambulatory Visit: Payer: Self-pay | Attending: Family Medicine | Admitting: Family Medicine

## 2015-07-13 ENCOUNTER — Encounter: Payer: Self-pay | Admitting: Family Medicine

## 2015-07-13 VITALS — BP 139/73 | HR 61 | Temp 98.3°F | Resp 14 | Ht 62.0 in | Wt 119.8 lb

## 2015-07-13 DIAGNOSIS — R21 Rash and other nonspecific skin eruption: Secondary | ICD-10-CM | POA: Insufficient documentation

## 2015-07-13 DIAGNOSIS — G4700 Insomnia, unspecified: Secondary | ICD-10-CM | POA: Insufficient documentation

## 2015-07-13 DIAGNOSIS — R7303 Prediabetes: Secondary | ICD-10-CM

## 2015-07-13 DIAGNOSIS — K219 Gastro-esophageal reflux disease without esophagitis: Secondary | ICD-10-CM | POA: Insufficient documentation

## 2015-07-13 DIAGNOSIS — Z86010 Personal history of colonic polyps: Secondary | ICD-10-CM | POA: Insufficient documentation

## 2015-07-13 DIAGNOSIS — F32A Depression, unspecified: Secondary | ICD-10-CM

## 2015-07-13 DIAGNOSIS — I10 Essential (primary) hypertension: Secondary | ICD-10-CM | POA: Insufficient documentation

## 2015-07-13 DIAGNOSIS — F329 Major depressive disorder, single episode, unspecified: Secondary | ICD-10-CM | POA: Insufficient documentation

## 2015-07-13 DIAGNOSIS — Z79899 Other long term (current) drug therapy: Secondary | ICD-10-CM | POA: Insufficient documentation

## 2015-07-13 DIAGNOSIS — Z886 Allergy status to analgesic agent status: Secondary | ICD-10-CM | POA: Insufficient documentation

## 2015-07-13 DIAGNOSIS — E119 Type 2 diabetes mellitus without complications: Secondary | ICD-10-CM | POA: Insufficient documentation

## 2015-07-13 DIAGNOSIS — E7800 Pure hypercholesterolemia, unspecified: Secondary | ICD-10-CM | POA: Insufficient documentation

## 2015-07-13 DIAGNOSIS — Z8249 Family history of ischemic heart disease and other diseases of the circulatory system: Secondary | ICD-10-CM | POA: Insufficient documentation

## 2015-07-13 DIAGNOSIS — Z0000 Encounter for general adult medical examination without abnormal findings: Secondary | ICD-10-CM | POA: Insufficient documentation

## 2015-07-13 LAB — POCT GLYCOSYLATED HEMOGLOBIN (HGB A1C): Hemoglobin A1C: 7.5

## 2015-07-13 MED ORDER — RANITIDINE HCL 150 MG PO TABS: 150.0000 mg | ORAL_TABLET | Freq: Two times a day (BID) | ORAL | Status: DC

## 2015-07-13 MED ORDER — HYDROCORTISONE 0.5 % EX CREA: 1.0000 "application " | TOPICAL_CREAM | Freq: Two times a day (BID) | CUTANEOUS | Status: DC

## 2015-07-13 MED ORDER — TRIAMTERENE-HCTZ 37.5-25 MG PO CAPS: 1.0000 | ORAL_CAPSULE | Freq: Every day | ORAL | Status: DC

## 2015-07-13 MED ORDER — METFORMIN HCL 500 MG PO TABS: 500.0000 mg | ORAL_TABLET | Freq: Two times a day (BID) | ORAL | Status: DC

## 2015-07-13 MED ORDER — GABAPENTIN 300 MG PO CAPS: 300.0000 mg | ORAL_CAPSULE | Freq: Three times a day (TID) | ORAL | Status: DC

## 2015-07-13 MED ORDER — TRUEPLUS LANCETS 28G MISC: 1.0000 | Freq: Three times a day (TID) | Status: AC

## 2015-07-13 MED ORDER — ATORVASTATIN CALCIUM 40 MG PO TABS: 40.0000 mg | ORAL_TABLET | Freq: Every day | ORAL | Status: DC

## 2015-07-13 MED ORDER — CITALOPRAM HYDROBROMIDE 20 MG PO TABS: 20.0000 mg | ORAL_TABLET | Freq: Every morning | ORAL | Status: DC

## 2015-07-13 MED ORDER — TRUE METRIX METER DEVI: 1.0000 | Freq: Three times a day (TID) | Status: AC

## 2015-07-13 MED ORDER — TRAZODONE HCL 100 MG PO TABS: 100.0000 mg | ORAL_TABLET | Freq: Every day | ORAL | Status: DC

## 2015-07-13 MED ORDER — GLUCOSE BLOOD VI STRP: ORAL_STRIP | Status: AC

## 2015-07-13 NOTE — Progress Notes (Signed)
[employer] [phone] used for [name] interpretation Patient here for pruritic rash x 2 weeks She denies depression/anxiety She just picked up her cetirizine-she was previously out

## 2015-07-13 NOTE — Patient Instructions (Signed)
Diabetes Mellitus and Food It is important for you to manage your blood sugar (glucose) level. Your blood glucose level can Yuritza greatly affected by what you eat. Eating healthier foods in the appropriate amounts throughout the day at about the same time each day will help you control your blood glucose level. It can also help slow or prevent worsening of your diabetes mellitus. Healthy eating may even help you improve the level of your blood pressure and reach or maintain a healthy weight.  General recommendations for healthful eating and cooking habits include:  Eating meals and snacks regularly. Avoid going long periods of time without eating to lose weight.  Eating a diet that consists mainly of plant-based foods, such as fruits, vegetables, nuts, legumes, and whole grains.  Using low-heat cooking methods, such as baking, instead of high-heat cooking methods, such as deep frying. Work with your dietitian to make sure you understand how to use the Nutrition Facts information on food labels. HOW CAN FOOD AFFECT ME? Carbohydrates Carbohydrates affect your blood glucose level more than any other type of food. Your dietitian will help you determine how many carbohydrates to eat at each meal and teach you how to count carbohydrates. Counting carbohydrates is important to keep your blood glucose at a healthy level, especially if you are using insulin or taking certain medicines for diabetes mellitus. Alcohol Alcohol can cause sudden decreases in blood glucose (hypoglycemia), especially if you use insulin or take certain medicines for diabetes mellitus. Hypoglycemia can Glynn a life-threatening condition. Symptoms of hypoglycemia (sleepiness, dizziness, and disorientation) are similar to symptoms of having too much alcohol.  If your health care provider has given you approval to drink alcohol, do so in moderation and use the following guidelines:  Women should not have more than one drink per day, and men  should not have more than two drinks per day. One drink is equal to:  12 oz of beer.  5 oz of wine.  1 oz of hard liquor.  Do not drink on an empty stomach.  Keep yourself hydrated. Have water, diet soda, or unsweetened iced tea.  Regular soda, juice, and other mixers might contain a lot of carbohydrates and should Leanny counted. WHAT FOODS ARE NOT RECOMMENDED? As you make food choices, it is important to remember that all foods are not the same. Some foods have fewer nutrients per serving than other foods, even though they might have the same number of calories or carbohydrates. It is difficult to get your body what it needs when you eat foods with fewer nutrients. Examples of foods that you should avoid that are high in calories and carbohydrates but low in nutrients include:  Trans fats (most processed foods list trans fats on the Nutrition Facts label).  Regular soda.  Juice.  Candy.  Sweets, such as cake, pie, doughnuts, and cookies.  Fried foods. WHAT FOODS CAN I EAT? Eat nutrient-rich foods, which will nourish your body and keep you healthy. The food you should eat also will depend on several factors, including:  The calories you need.  The medicines you take.  Your weight.  Your blood glucose level.  Your blood pressure level.  Your cholesterol level. You should eat a variety of foods, including:  Protein.  Lean cuts of meat.  Proteins low in saturated fats, such as fish, egg whites, and beans. Avoid processed meats.  Fruits and vegetables.  Fruits and vegetables that may help control blood glucose levels, such as apples, mangoes, and   yams.  Dairy products.  Choose fat-free or low-fat dairy products, such as milk, yogurt, and cheese.  Grains, bread, pasta, and rice.  Choose whole grain products, such as multigrain bread, whole oats, and brown rice. These foods may help control blood pressure.  Fats.  Foods containing healthful fats, such as nuts,  avocado, olive oil, canola oil, and fish. DOES EVERYONE WITH DIABETES MELLITUS HAVE THE SAME MEAL PLAN? Because every person with diabetes mellitus is different, there is not one meal plan that works for everyone. It is very important that you meet with a dietitian who will help you create a meal plan that is just right for you.   This information is not intended to replace advice given to you by your health care provider. Make sure you discuss any questions you have with your health care provider.   Document Released: 04/19/2005 Document Revised: 08/13/2014 Document Reviewed: 06/19/2013 Elsevier Interactive Patient Education 2016 Elsevier Inc.  

## 2015-07-13 NOTE — Progress Notes (Signed)
CC: skin rash on left arm  HPI: Danielle Richard is a 64 y.o. female here today for a follow up visit.  Complains of pruritic rash on right forearm for the last 2 weeks and keeps recurring. It starts out as a blister and then later dries up. She has had three occurrences this year; denies fever. She has not used any medications for it.  She has been compliant with her antihypertensive for hypertension and has been taking her Lipitor for hypercholesterolemia. Depression is controlled on Celexa. A1c is 7.5 which reveals a new diagnosis of type 2 diabetes mellitus  Patient has No headache, No chest pain, No abdominal pain - No Nausea, No new weakness tingling or numbness, No Cough - SOB.  Allergies  Allergen Reactions  . Tylenol [Acetaminophen]   . Aspirin Rash   Past Medical History  Diagnosis Date  . Hypertension   . HLD (hyperlipidemia)   . Depression   . Personal history of colonic polyps   . GERD (gastroesophageal reflux disease)   . Insomnia   . Aortic insufficiency    Current Outpatient Prescriptions on File Prior to Visit  Medication Sig Dispense Refill  . atorvastatin (LIPITOR) 40 MG tablet Take 1 tablet (40 mg total) by mouth daily. 90 tablet 1  . calcium-vitamin D (OSCAL WITH D) 500-200 MG-UNIT per tablet Take 1 tablet by mouth daily with breakfast. 60 tablet 3  . cetirizine (ZYRTEC) 10 MG tablet Take 1 tablet (10 mg total) by mouth daily. 30 tablet 3  . citalopram (CELEXA) 20 MG tablet Take 1 tablet (20 mg total) by mouth every morning. 60 tablet 3  . doxycycline (VIBRA-TABS) 100 MG tablet Take 1 tablet (100 mg total) by mouth 2 (two) times daily. 14 tablet 0  . gabapentin (NEURONTIN) 300 MG capsule Take 1 capsule (300 mg total) by mouth 3 (three) times daily. 90 capsule 3  . methylPREDNISolone (MEDROL DOSEPAK) 4 MG tablet follow package directions 21 tablet 0  . methylPREDNISolone (MEDROL DOSEPAK) 4 MG TBPK tablet follow package directions 21 tablet 0  . omega-3 acid ethyl  esters (LOVAZA) 1 G capsule Take 1 capsule (1 g total) by mouth daily. 90 capsule 3  . ranitidine (ZANTAC) 150 MG tablet Take 0.5 tablets (75 mg total) by mouth 2 (two) times daily. Half tablet twice daily 120 tablet 2  . traZODone (DESYREL) 100 MG tablet Take 1 tablet (100 mg total) by mouth at bedtime. 90 tablet 1  . triamterene-hydrochlorothiazide (DYAZIDE) 37.5-25 MG per capsule Take 1 each (1 capsule total) by mouth daily. 90 capsule 3  . [DISCONTINUED] zolpidem (AMBIEN) 10 MG tablet Take 1 tablet (10 mg total) by mouth at bedtime as needed for sleep. 30 tablet 0   No current facility-administered medications on file prior to visit.   Family History  Problem Relation Age of Onset  . CAD Mother    Social History   Social History  . Marital Status: Married    Spouse Name: N/A  . Number of Children: 3  . Years of Education: N/A   Occupational History  . Not on file.   Social History Main Topics  . Smoking status: Never Smoker   . Smokeless tobacco: Never Used  . Alcohol Use: No  . Drug Use: No  . Sexual Activity: Not on file   Other Topics Concern  . Not on file   Social History Narrative    Review of Systems: Constitutional: Negative for fever, chills, diaphoresis, activity change, appetite  change and fatigue. HENT: Negative for ear pain, nosebleeds, congestion, facial swelling, rhinorrhea, neck pain, neck stiffness and ear discharge.  Eyes: Negative for pain, discharge, redness, itching and visual disturbance. Respiratory: Negative for cough, choking, chest tightness, shortness of breath, wheezing and stridor.  Cardiovascular: Negative for chest pain, palpitations and leg swelling. Gastrointestinal: Negative for abdominal distention. Genitourinary: Negative for dysuria, urgency, frequency, hematuria, flank pain, decreased urine volume, difficulty urinating and dyspareunia.  Musculoskeletal: see hpi. Neurological: Negative for dizziness, tremors, seizures, syncope,  facial asymmetry, speech difficulty, weakness, light-headedness, numbness and headaches.  Hematological: Negative for adenopathy. Does not bruise/bleed easily. Psychiatric/Behavioral: Negative for hallucinations, behavioral problems, confusion, dysphoric mood, decreased concentration and agitation.    Objective: Filed Vitals:   07/13/15 1223  BP: 139/73  Pulse: 61  Temp: 98.3 F (36.8 C)  Resp: 14  Height:  (1.575 m)  Weight: 119 lb 12.8 oz (54.341 kg)  SpO2: 96%       Physical Exam: Constitutional: Patient appears well-developed and well-nourished. No distress. HENT: Normocephalic, atraumatic, External right and left ear normal. Oropharynx is clear and moist.  Eyes: Conjunctivae and EOM are normal. PERRLA, no scleral icterus. Neck: Normal ROM. Neck supple. No JVD. No tracheal deviation. No thyromegaly. CVS: RRR, S1/S2 +, no murmurs, no gallops, no carotid bruit.  Pulmonary: Effort and breath sounds normal, no stridor, rhonchi, wheezes, rales.  Abdominal: Soft. BS +,  no distension, tenderness, rebound or guarding.  Musculoskeletal: Normal range of motion. No edema and no tenderness.  Lymphadenopathy: No lymphadenopathy noted, cervical, inguinal or axillary Neuro: Alert. Normal reflexes, muscle tone coordination. No cranial nerve deficit. Skin: healed scars from previous rash on left forearm Psychiatric: Normal mood and affect. Behavior, judgment, thought content normal.  Lab Results  Component Value Date   WBC 4.1 09/09/2013   HGB 13.0 09/09/2013   HCT 37.9 09/09/2013   MCV 88.3 09/09/2013   PLT 281 09/09/2013   Lab Results  Component Value Date   CREATININE 0.74 01/17/2015   BUN 23 01/17/2015   NA 143 01/17/2015   K 4.6 01/17/2015   CL 102 01/17/2015   CO2 25 01/17/2015    Lab Results  Component Value Date   HGBA1C 7.50 07/13/2015   Lipid Panel     Component Value Date/Time   CHOL 230* 01/17/2015 0908   TRIG 339* 01/17/2015 0908   HDL 36* 01/17/2015  0908   CHOLHDL 6.4 01/17/2015 0908   VLDL 68* 01/17/2015 0908   LDLCALC 126* 01/17/2015 0908       Assessment and plan:   Hypertension: Controlled continue medications Fasting labs ordered  Type 2 diabetes mellitus: Newly diagnosed. Placed on metformin and testing supplies written Scheduled to the clinical pharmacist for diabetic education.  Hyperlipidemia: Uncontrolled. Lipid panel ordered and we'll adjust dose accordingly.  Rash: Almost resolved. Hydrocortisone written in the event of recurrence.  Depression: Controlled. Continue Celexa  This note has been created with Education officer, environmental. Any transcriptional errors are unintentional.    Jaclyn Shaggy, MD. [facility] [phone] 07/13/2015, 12:20 PM

## 2015-07-19 ENCOUNTER — Other Ambulatory Visit: Payer: Self-pay | Admitting: Internal Medicine

## 2015-07-20 ENCOUNTER — Ambulatory Visit: Payer: Self-pay | Attending: Family Medicine | Admitting: Pharmacist

## 2015-07-20 DIAGNOSIS — I10 Essential (primary) hypertension: Secondary | ICD-10-CM

## 2015-07-20 DIAGNOSIS — R7303 Prediabetes: Secondary | ICD-10-CM

## 2015-07-20 DIAGNOSIS — E119 Type 2 diabetes mellitus without complications: Secondary | ICD-10-CM | POA: Insufficient documentation

## 2015-07-20 DIAGNOSIS — Z79899 Other long term (current) drug therapy: Secondary | ICD-10-CM | POA: Insufficient documentation

## 2015-07-20 LAB — LIPID PANEL
Cholesterol: 136 mg/dL (ref 125–200)
HDL: 45 mg/dL — ABNORMAL LOW (ref >=46)
LDL Cholesterol: 67 mg/dL (ref <130)
Total CHOL/HDL Ratio: 3 Ratio (ref <=5.0)
Triglycerides: 120 mg/dL (ref <150)
VLDL: 24 mg/dL (ref <30)

## 2015-07-20 LAB — COMPLETE METABOLIC PANEL WITH GFR
ALT: 43 U/L — ABNORMAL HIGH (ref 6–29)
AST: 43 U/L — ABNORMAL HIGH (ref 10–35)
Albumin: 4.6 g/dL (ref 3.6–5.1)
Alkaline Phosphatase: 65 U/L (ref 33–130)
BUN: 15 mg/dL (ref 7–25)
CO2: 31 mmol/L (ref 20–31)
Calcium: 10 mg/dL (ref 8.6–10.4)
Chloride: 103 mmol/L (ref 98–110)
Creat: 0.81 mg/dL (ref 0.50–0.99)
GFR, Est African American: 89 mL/min (ref >=60)
GFR, Est Non African American: 78 mL/min (ref >=60)
Glucose, Bld: 103 mg/dL — ABNORMAL HIGH (ref 65–99)
Potassium: 5 mmol/L (ref 3.5–5.3)
Sodium: 145 mmol/L (ref 135–146)
Total Bilirubin: 0.6 mg/dL (ref 0.2–1.2)
Total Protein: 7.4 g/dL (ref 6.1–8.1)

## 2015-07-20 NOTE — Progress Notes (Signed)
S:    Patient arrives in good spirits with her husband.  Presents for diabetes education. Patient is [name] speaking so a virtual interpreter was used for the entire encounter Greig Castilla(Andrew [phone]).  Patient denies adherence with medications. Current diabetes medications include metformin 500 mg BID but she has not picked up the medication yet. She also has not picked up the meter yet.  Patient reports that she doesn't know anything about diabetes except that it has to do with blood sugar. She denies knowing the complications or what an A1c is.  Patient denies hypoglycemic events.  Patient reported dietary habits: patient does not understand what carbohydrates are and is interested in learning more  Patient reported exercise habits: none   O:  Lab Results  Component Value Date   HGBA1C 7.50 07/13/2015    A/P: Diabetes newly diagnosed currently uncontrolled based on A1c of 7.5.   Patient denies hypoglycemic events and is able to verbalize appropriate hypoglycemia management plan.  Patient denies adherence with medication. Control is suboptimal due to sedentary lifestyle and dietary indiscretion. Patient has a poor understanding of diabetes.  Instructed patient to pick up metformin and to start the medication, and to pick up the meter. Provided counseling on the medication, including how to take and adverse effects. Also provided education on A1c, hypo and hyperglycemia, plate method and basic carbohydrate education, blood glucose monitoring, and the complications of diabetes. Patient verbalized understanding.  Next A1C anticipated March 2017.    Written patient instructions provided.  Total time in face to face counseling 35 minutes. Follow up in Pharmacist Clinic Visit as needed.

## 2015-07-20 NOTE — Patient Instructions (Signed)
B?nh ti?u ???ng tp 2, Ng??i l?n (Type 2 Diabetes Mellitus, Adult) B?nh ti?u ???ng tp 2, th??ng g?i ??n gi?n l ti?u ???ng tp 2, l m?t b?nh ko di (m?n tnh). Trong ti?u ???ng tp 2, tuy?n t?y khng s?n xu?t ?? insulin (hocmon), cc t? bo t ?p ?ng v?i insulin lm cho ( khng insulin), ho?c c? hai. Thng th??ng, insulin v?n chuy?n ???ng t? th?c ?n vo cc t? bo ? m. Cc t? bo ? m s? d?ng ???ng ?? s?n sinh ra n?ng l??ng. Thi?u h?t insulin ho?c khng ?p ?ng bnh th??ng v?i insulin gy ra l??ng ???ng d? th?a tch t? trong mu thay v ?i vo cc t? bo ? m. K?t qu? l, lm cho l??ng ???ng trong mu cao (t?ng ???ng huy?t). ?nh h??ng c?a hm l??ng ???ng (glucose) cao c th? gy ra nhi?u bi?n ch?ng.  B?nh ti?u ???ng tp 2 tr??c ?y cn ???c g?i l b?nh ti?u ???ng kh?i pht ? ng??i l?n, nh?ng n c th? x?y ra ? b?t c? l?a tu?i no.  CC Y?U T? NGUY C?  M?t ng??i d? b? b?nh ti?u ???ng tp 2 n?u c ai ? trong gia ?nh b? b?nh ny, ??ng th?i c m?t ho?c nhi?u y?u t? nguy c? chnh sau ?y:  T?ng cn ho?c th?a cn ho?c bo ph.  L?i s?ng t ho?t ??ng.  Ti?n s? lin t?c ?n th?c ?n nhi?u n?ng l??ng. Duy tr cn n?ng bnh th??ng v ho?t ??ng thn th? th??ng xuyn c th? lm gi?m nguy c? pht tri?n b?nh ti?u ???ng tp 2. TRI?U CH?NG  Ban ??u, m?t ng??i b? b?nh ti?u ???ng tp 2 c th? khng c cc tri?u ch?ng. Cc tri?u ch?ng c?a b?nh ti?u ???ng tp 2 xu?t hi?n t? t?. Cc tri?u ch?ng bao g?m:  Kht n??c nhi?u (ch?ng kht nhi?u).  Ti?u ti?n nhi?u (?a ni?u).  ?i ti?u nhi?u vo ban ?m (ti?u ?m).  Thay ??i cn n?ng m?t cch ??t ng?t ho?c khng r nguyn nhn.  Th??ng xuyn b? nhi?m trng ti pht.  M?t m?i (m?t)  Y?u.  Thay ??i th? l?c, ch?ng h?n nh? nhn m?.  Mi tri cy trong h?i th? c?a qu v?.  ?au b?ng.  Bu?n nn ho?c nn m?a.  V?t c?t ho?c v?t b?m tm lu lnh.  ?au bu?t ho?c t ? bn tay ho?c bn chn.  V?t th??ng h? trn da (lot). CH?N ?ON B?nh ti?u ???ng tp 2 th??ng  khng ???c ch?n ?on cho ??n khi xu?t hi?n cc bi?n ch?ng c?a b?nh ti?u ???ng. B?nh ti?u ???ng tp 2 ???c ch?n ?on khi xu?t hi?n cc tri?u ch?ng c?ng nh? bi?n ch?ng v khi l??ng ???ng huy?t t?ng. L??ng ???ng huy?t c th? ???c ki?m tra b?ng m?t ho?c nhi?u xt nghi?m mu sau ?y:  Xt nghi?m ???ng huy?t lc ?i. Qu v? s? khng ???c php ?n trong t nh?t l 8 ti?ng tr??c khi l?y m?u mu.  Xt nghi?m ???ng huy?t ng?u nhin. ???ng huy?t ???c xt nghi?m b?t k? lc no trong ngy, b?t k? qu v? ?n lc no.  Xt nghi?m ???ng huy?t A1c hemoglobin. Xt nghi?m A1c hemoglobin cung c?p thng tin v? vi?c ki?m sot ???ng huy?t trong 3 thng tr??c ?.  Xt nghi?m dung n?p glucose theo ???ng u?ng (OGTT). ???ng huy?t c?a qu v? ???c ?o sau khi qu v? ch?a ?n (nh?n ?n) trong 2 gi? v sau ? l sau khi qu v? u?ng ?? u?ng c ch?a glucose. ?  I?U TR?   Qu v? c th? c?n dng insulin ho?c thu?c tr? ti?u ???ng hng ngy ?? gi? cho l??ng ???ng huy?t trong ph?m vi mong mu?n.  N?u qu v? dng insulin, qu v? c th? c?n ?i?u ch?nh li?u thu?c ty thu?c vo l??ng carbohydrate m qu v? ?n trong m?i b?a ?n chnh ho?c b?a ?n nh?.  Thay ??i l?i s?ng ???c khuy?n ngh? l m?t ph?n trong bi?n php ?i?u tr? c?a qu v?. Nh?ng thay ??i ny c th? bao g?m:  Tun theo m?t ch? ?? ?n c nhn ha do m?t chuyn gia dinh d??ng ??a ra.  T?p th? d?c hng ngy. Chuyn gia ch?m sc s?c kh?e s? ??t cc m?c tiu ?i?u tr? c nhn ha cho qu v? d?a theo tu?i, cc lo?i thu?c c?a qu v?, th?i gian qu v? ? b? ti?u t??ng, v b?t k? tnh tr?ng b?nh l no khc m qu v? c. Thng th??ng, m?c tiu ?i?u tr? l duy tr n?ng ?? glucose trong mu:  Tr??c khi ?n (tr??c b?a ?n): 80-130 mg/dL.  Sau khi ?n (sau b?a ?n): d??i 180 mg/dL.  A1c: th?p h?n 6,5-7%. H??NG D?N CH?M SC T?I NH   L??ng A1c hemoglobin c?a qu v? ???c ki?m tra hai l?n m?i n?m.  Th?c hi?n vi?c theo di ???ng huy?t hng ngy theo ch? d?n c?a chuyn gia ch?m sc s?c kh?e.  Theo  di [place] trong n??c ti?u khi qu v? b? b?nh v theo ch? d?n c?a chuyn gia ch?m sc s?c kh?e.  S? d?ng thu?c tr? ti?u ???ng ho?c insulin theo ch? d?n c?a chuyn gia ch?m sc s?c kh?e ?? duy tr l??ng ???ng huy?t trong ph?m vi mong mu?n.  Khng bao gi? ?? h?t thu?c tr? ti?u ???ng ho?c insulin. Thu?c c?n ph?i dng hng ngy.  N?u qu v? ?ang dng insulin, qu v? c th? ?i?u ch?nh l??ng insulin d?a vo l??ng carbohydrates qu v? ?n. Carbohydrate c th? lm t?ng l??ng ???ng huy?t nh?ng c?n ph?i bao g?m trong ch? ?? ?n u?ng c?a qu v?. Carbohydrate cung c?p vitamin, khong ch?t v ch?t x?, l m?t ph?n thi?t y?u c?a ch? ?? ?n u?ng c l?i cho s?c kh?e. Carbohydrate ???c tm th?y trong tri cy, rau, ng? c?c, cc s?n ph?m t? s?a, cc lo?i ??u v cc lo?i th?c ph?m c b? sung thm ???ng.  ?n th?c ?n c l?i cho s?c kh?e. Qu v? c?n h?n g?p m?t chuyn gia dinh d??ng c ??ng k hnh ngh? ?? gip qu v? ??a ra m?t k? ho?ch ?n u?ng ph h?p.  Gi?m cn n?u qu v? th?a cn.  Mang theo th? c?nh bo y t? ho?c ?eo ?? trang s?c c c?nh bo y t?.  Mang theo ?? ?n nh? ch?a 15 gam carbohydrate m?i lc ?? ?i?u tr? h? ???ng huy?t (h? ???ng huy?t). M?t s? v d? v? ?? ?n nh? ch?a 15 gam carbohydrate bao g?m:  Vin glucose, 3 ho?c 4.  Gel glucose, ?ng 15 gam.  Nho kh, 2 mu?ng (24 gam).  Th?ch hnh h?t ??u, 6.  Bnh quy hnh con gi?ng, 8.  N??c u?ng c ga thng th??ng, 4 aox? (120 ml)  K?o chp chp, 9.  Nh?n bi?t h? ???ng huy?t. H? ???ng huy?t x?y ra khi l??ng ???ng huy?t t? 70 mg/dL tr? xu?ng. Nguy c? h? ???ng huy?t gia t?ng khi nh?n ?n ho?c b? b?a, trong v sau khi t?p th? d?c c??ng ?? cao v trong khi   ng?. Cc tri?u ch?ng h? ???ng huy?t c th? bao g?m:  Run ho?c l?c.  Gi?m kh? n?ng t?p trung.  ?? m? hi.  Nh?p tim t?ng.  ?au ??u.  Kh mi?ng.  ?i.  D? b? kch thch.  Lo u.  Ng? khng yn.  Thay ??i l?i ni ho?c s? ph?i h?p.  B? l l?n.  ?i?u tr? h? ???ng huy?t k?p th?i. N?u qu v?  t?nh to v c th? nu?t m?t cch an ton, hy theo quy t?c 15:15:  Dng 15-20 gam glucose ho?c carbohydrate c tc d?ng nhanh. L?a ch?n tc ??ng nhanh bao g?m gel glucose, vin glucose ho?c 4 aox? (120 ml) n??c p tri cy, soda bnh th??ng ho?c s?a t bo.  Ki?m tra l??ng ???ng huy?t c?a qu v? 15 pht sau khi u?ng glucose.  Dng t? 15-20 gam glucose tr? ln n?u l??ng ???ng huy?t ???c ?o l?i v?n ? m?c 70 mg/dL tr? xu?ng.  ?n theo b?a ?n bnh th??ng ho?c ?? ?n nh? trong vng 1 ti?ng sau khi l??ng ???ng huy?t tr? l?i bnh th??ng.  Hy c?nh gic v?i c?m gic r?t kht v ?i ti?u ti?n nhi?u l?n h?n bnh th??ng v ?y l nh?ng d?u hi?u s?m c?a t?ng ???ng huy?t. Vi?c pht hi?n t?ng ???ng huy?t s?m cho php ?i?u tr? k?p th?i. ?i?u tr? t?ng ???ng huy?t theo ch? d?n c?a chuyn gia ch?m sc s?c kh?e.  M?i tu?n tham gia vo t nh?t 150 pht ho?t ??ng thn th? v?i c??ng ?? trung bnh, phn b? trong t nh?t 3 ngy trong tu?n ho?c theo ch? d?n c?a chuyn gia ch?m sc s?c kh?e. Ngoi ra, qu v? nn tham gia vo bi t?p c s?c c?n t nh?t 2 l?n m?t tu?n ho?c theo ch? d?n c?a chuyn gia ch?m sc s?c kh?e. C? g?ng dnh khng qu 90 pht m?i l?n khng ho?t ??ng.  ?i?u ch?nh thu?c v l??ng th?c ?n khi c?n n?u qu v? b?t ??u m?t bi t?p ho?c m?t mn th? thao m?i.  Lm theo k? ho?ch trong ngy b? b?nh c?a qu v? b?t c? lc no m qu v? khng th? ?n ho?c u?ng nh? bnh th??ng.  Khng s? d?ng cc s?n ph?m thu?c l bao g?m thu?c l ht, thu?c l d?ng nhai ho?c thu?c l ?i?n t?. N?u qu v? c?n gip ?? ?? cai thu?c, hy h?i chuyn gia ch?m sc s?c kh?e.  Gi?i h?n l??ng r??u qu v? u?ng khng qu 1 ly m?i ngy v?i ph? n? khng mang thai v 2 ly m?i ngy v?i nam gi?i. Qu v? ch? nn u?ng r??u khi ?n. Ni chuy?n v?i chuyn gia ch?m sc s?c kh?e xem u?ng r??u c an ton cho qu v? hay khng. Cho chuyn gia ch?m sc s?c kh?e bi?t n?u qu v? u?ng r??u vi l?n m?i tu?n.  Tun th? m?i cu?c h?n khm l?i theo ch? d?n c?a chuyn  gia ch?m sc s?c kh?e. ?i?u ny l quan tr?ng.  S?p x?p bu?i khm m?t ngay sau khi ch?n ?on b?nh ti?u ???ng tp 2 v sau ? l hng n?m.  Th?c hi?n ch?m sc da v bn chn hng ngy. Ki?m tra da v bn chn hng ngy xem c v?t c?t, v?t b?m tm, t?y ??, v?n ?? v? mng, ch?y mu, m?n n??c hay l? lot khng. Bn chn c?n ???c chuyn gia ch?m sc s?c kh?e khm hng n?m.  ?nh r?ng v l?i t nh?t hai l?n m?i ngy   v dng ch? nha khoa t nh?t m?t l?n m?i ngy. G?p nha s? ?? khm l?i th??ng xuyn.  Chia s? k? ho?ch qu?n l b?nh ti?u ???ng c?a qu v? ? n?i lm vi?c ho?c tr??ng h?c c?a qu v?.  C?p nh?t l?ch tim ch?ng c?a qu v?. Qu v? nn tim v?c xin cm (b?nh cm) hng n?m. Qu v? c?ng nn tim v?c xin vim ph?i (ph? c?u khu?n). N?u qu v? 65 tu?i tr? ln v ch?a ???c tim v?c xin vim ph?i, v?c xin ny c th? ???c tim m?t lo?t hai m?i ring. Hy h?i chuyn gia ch?m sc s?c kh?e xem nn tim thm lo?i v?c xin no.  H?c cch qu?n l c?ng th?ng.  Xin ???c gio d?c v h? tr? v? b?nh ti?u ???ng th??ng xuyn khi c?n.  Tham gia ho?c tm cch ph?c h?i ch?c n?ng khi c?n thi?t ?? duy tr ho?c c?i thi?n kh? n?ng ??c l?p v ch?t l??ng cu?c s?ng. Yu c?u chuy?n sang v?t l tr? li?u ho?c li?u php ngh? nghi?p n?u qu v? b? t bn chn ho?c t tay, ho?c kh ch?i ??u, kh m?c qu?n o, ?n u?ng ho?c ho?t ??ng th? ch?t. ?I KHM N?U:   Qu v? khng th? ?n ho?c u?ng trong h?n 6 ti?ng.  Qu v? b? bu?n nn v nn m?a trong h?n 6 ti?ng.  L??ng ???ng huy?t c?a qu v? cao trn 240 mg/dL.  C thay ??i tr?ng thi tinh th?n.  Qu v? b? thm m?t c?n b?nh nghim tr?ng.  Qu v? b? tiu ch?y trong h?n 6 ti?ng.  Qu v? ? b? ?m ho?c b? s?t trong m?t vi ngy v khng ?? h?n.  Qu v? b? ?au trong khi tham gia b?t k? ho?t ??ng thn th? no. NGAY L?P T?C ?I KHM N?U:  Qu v? b? kh th?.  Qu v? c l??ng ketone ? m?c trung bnh ??n cao.   Thng tin ny khng nh?m m?c ?ch thay th? cho l?i khuyn m chuyn gia ch?m  sc s?c kh?e ni v?i qu v?. Hy b?o ??m qu v? ph?i th?o lu?n b?t k? v?n ?? g m qu v? c v?i chuyn gia ch?m sc s?c kh?e c?a qu v?.   Document Released: 07/23/2005 Document Revised: 04/13/2015 Elsevier Interactive Patient Education 2016 [employer] Inc.  

## 2015-07-20 NOTE — Addendum Note (Signed)
Addended by: Juanita CraverKARL, STACEY A on: 07/20/2015 10:52 AM   Modules accepted: Orders, Medications

## 2015-07-21 ENCOUNTER — Telehealth (HOSPITAL_COMMUNITY): Payer: Self-pay | Admitting: *Deleted

## 2015-07-21 NOTE — Telephone Encounter (Signed)
[employer] interpreter id [id number]  Patient name and date of birth verified and results given to patient.  Patient verbalized understanding.  RN stressed the importance of keeping her follow up appointment that is already scheduled for 08/03/15 at 1000.  Patient was in agreement.

## 2015-07-21 NOTE — Telephone Encounter (Signed)
-----   Message from Jaclyn ShaggyEnobong Amao, MD sent at 07/21/2015  1:59 PM EST ----- Normal lipid panel but mildly elevated LFTs which could [place] secondary to statin. We will continue to monitor her LFTs.

## 2015-08-03 ENCOUNTER — Ambulatory Visit: Payer: Self-pay | Attending: Family Medicine | Admitting: Family Medicine

## 2015-08-03 ENCOUNTER — Encounter: Payer: Self-pay | Admitting: Family Medicine

## 2015-08-03 VITALS — BP 131/74 | HR 71 | Temp 98.5°F | Resp 13 | Ht 62.0 in | Wt 116.6 lb

## 2015-08-03 DIAGNOSIS — F32A Depression, unspecified: Secondary | ICD-10-CM

## 2015-08-03 DIAGNOSIS — Z23 Encounter for immunization: Secondary | ICD-10-CM

## 2015-08-03 DIAGNOSIS — K219 Gastro-esophageal reflux disease without esophagitis: Secondary | ICD-10-CM | POA: Insufficient documentation

## 2015-08-03 DIAGNOSIS — F329 Major depressive disorder, single episode, unspecified: Secondary | ICD-10-CM | POA: Insufficient documentation

## 2015-08-03 DIAGNOSIS — I10 Essential (primary) hypertension: Secondary | ICD-10-CM | POA: Insufficient documentation

## 2015-08-03 DIAGNOSIS — Z86010 Personal history of colonic polyps: Secondary | ICD-10-CM | POA: Insufficient documentation

## 2015-08-03 DIAGNOSIS — E119 Type 2 diabetes mellitus without complications: Secondary | ICD-10-CM | POA: Insufficient documentation

## 2015-08-03 DIAGNOSIS — E785 Hyperlipidemia, unspecified: Secondary | ICD-10-CM | POA: Insufficient documentation

## 2015-08-03 DIAGNOSIS — Z886 Allergy status to analgesic agent status: Secondary | ICD-10-CM | POA: Insufficient documentation

## 2015-08-03 DIAGNOSIS — Z79899 Other long term (current) drug therapy: Secondary | ICD-10-CM | POA: Insufficient documentation

## 2015-08-03 DIAGNOSIS — Z7984 Long term (current) use of oral hypoglycemic drugs: Secondary | ICD-10-CM | POA: Insufficient documentation

## 2015-08-03 LAB — GLUCOSE, POCT (MANUAL RESULT ENTRY): POC Glucose: 156 mg/dl — AB (ref 70–99)

## 2015-08-03 NOTE — Progress Notes (Signed)
CC:  HPI: Danielle Richard is a 64 y.o. female here today for a follow up visit and seen with a [name] language line. She was diagnosed with Type 2 DM with an A1c of 7.5 at her last office visit and she was placed on Metformin and has been to see the Clinical Pharmacist for Diabetic education. Her blood sugar log reveals fasting sugars in the 77-114 range.  For hypercholesterolemia she remains on a statin and for depression she is doing well on Celexa Patient has No headache, No chest pain, No abdominal pain - No Nausea, No new weakness tingling or numbness, No Cough - SOB.  Allergies  Allergen Reactions  . Tylenol [Acetaminophen]   . Aspirin Rash   Past Medical History  Diagnosis Date  . Hypertension   . HLD (hyperlipidemia)   . Depression   . Personal history of colonic polyps   . GERD (gastroesophageal reflux disease)   . Insomnia   . Aortic insufficiency    Current Outpatient Prescriptions on File Prior to Visit  Medication Sig Dispense Refill  . atorvastatin (LIPITOR) 40 MG tablet Take 1 tablet (40 mg total) by mouth daily. 30 tablet 3  . Blood Glucose Monitoring Suppl (TRUE METRIX METER) DEVI 1 each by Does not apply route 3 (three) times daily before meals. 1 Device 0  . calcium-vitamin D (OSCAL WITH D) 500-200 MG-UNIT per tablet Take 1 tablet by mouth daily with breakfast. 60 tablet 3  . cetirizine (ZYRTEC) 10 MG tablet TAKE 1 TABLET BY MOUTH DAILY. 30 tablet 3  . citalopram (CELEXA) 20 MG tablet Take 1 tablet (20 mg total) by mouth every morning. 60 tablet 3  . gabapentin (NEURONTIN) 300 MG capsule Take 1 capsule (300 mg total) by mouth 3 (three) times daily. 90 capsule 3  . glucose blood (TRUE METRIX BLOOD GLUCOSE TEST) test strip Use 3 times daily before meals 60 each 12  . hydrocortisone cream 0.5 % Apply 1 application topically 2 (two) times daily. 30 g 0  . metFORMIN (GLUCOPHAGE) 500 MG tablet Take 1 tablet (500 mg total) by mouth 2 (two) times daily with a meal. 60  tablet 3  . omega-3 acid ethyl esters (LOVAZA) 1 G capsule Take 1 capsule (1 g total) by mouth daily. 90 capsule 3  . ranitidine (ZANTAC) 150 MG tablet Take 1 tablet (150 mg total) by mouth 2 (two) times daily. Half tablet twice daily 60 tablet 3  . traZODone (DESYREL) 100 MG tablet Take 1 tablet (100 mg total) by mouth at bedtime. 30 tablet 3  . triamterene-hydrochlorothiazide (DYAZIDE) 37.5-25 MG capsule Take 1 each (1 capsule total) by mouth daily. 30 capsule 3  . TRUEPLUS LANCETS 28G MISC 1 each by Does not apply route 3 (three) times daily before meals. 100 each 12  . [DISCONTINUED] zolpidem (AMBIEN) 10 MG tablet Take 1 tablet (10 mg total) by mouth at bedtime as needed for sleep. 30 tablet 0   No current facility-administered medications on file prior to visit.   Family History  Problem Relation Age of Onset  . CAD Mother    Social History   Social History  . Marital Status: Married    Spouse Name: N/A  . Number of Children: 3  . Years of Education: N/A   Occupational History  . Not on file.   Social History Main Topics  . Smoking status: Never Smoker   . Smokeless tobacco: Never Used  . Alcohol Use: No  . Drug Use:  No  . Sexual Activity: Not on file   Other Topics Concern  . Not on file   Social History Narrative    Review of Systems: Constitutional: Negative for fever, chills, diaphoresis, activity change, appetite change and fatigue. HENT: Negative for ear pain, nosebleeds, congestion, facial swelling, rhinorrhea, neck pain, neck stiffness and ear discharge.  Eyes: Negative for pain, discharge, redness, itching and visual disturbance. Respiratory: Negative for cough, choking, chest tightness, shortness of breath, wheezing and stridor.  Cardiovascular: Negative for chest pain, palpitations and leg swelling. Gastrointestinal: Negative for abdominal distention. Genitourinary: Negative for dysuria, urgency, frequency, hematuria, flank pain, decreased urine volume,  difficulty urinating and dyspareunia.  Musculoskeletal: Negative for back pain, joint swelling, arthralgias and gait problem. Neurological: Negative for dizziness, tremors, seizures, syncope, facial asymmetry, speech difficulty, weakness, light-headedness, numbness and headaches.  Hematological: Negative for adenopathy. Does not bruise/bleed easily. Psychiatric/Behavioral: Negative for hallucinations, behavioral problems, confusion, dysphoric mood, decreased concentration and agitation.    Objective:   Filed Vitals:   08/03/15 1008  BP: 131/74  Pulse: 71  Temp: 98.5 F (36.9 C)  Resp: 13    Physical Exam: Constitutional: Patient appears well-developed and well-nourished. No distress. CVS: RRR, S1/S2 +, no murmurs, no gallops, no carotid bruit.  Pulmonary: Effort and breath sounds normal, no stridor, rhonchi, wheezes, rales.  Abdominal: Soft. BS +,  no distension, tenderness, rebound or guarding.  Musculoskeletal: Normal range of motion. No edema and no tenderness.  Lymphadenopathy: No lymphadenopathy noted, cervical, inguinal or axillary Neuro: Alert. Normal reflexes, muscle tone coordination. No cranial nerve deficit. Skin: Skin is warm and dry. No rash noted. Not diaphoretic. No erythema. No pallor. Psychiatric: Normal mood and affect. Behavior, judgment, thought content normal.  Lab Results  Component Value Date   WBC 4.1 09/09/2013   HGB 13.0 09/09/2013   HCT 37.9 09/09/2013   MCV 88.3 09/09/2013   PLT 281 09/09/2013   Lab Results  Component Value Date   CREATININE 0.81 07/20/2015   BUN 15 07/20/2015   NA 145 07/20/2015   K 5.0 07/20/2015   CL 103 07/20/2015   CO2 31 07/20/2015    Lab Results  Component Value Date   HGBA1C 7.50 07/13/2015   Lipid Panel     Component Value Date/Time   CHOL 136 07/20/2015 1044   TRIG 120 07/20/2015 1044   HDL 45* 07/20/2015 1044   CHOLHDL 3.0 07/20/2015 1044   VLDL 24 07/20/2015 1044   LDLCALC 67 07/20/2015 1044         Assessment and plan:  Hypertension: Controlled continue medications  Type 2 diabetes mellitus: Newly diagnosed with Hba1c of 7.5 Controlled on metformin  Foot exam performed today, Pneumovax given, microalbumin ordered today. Advised to schedule annual eye exam with an optometrist.  Hyperlipidemia: Controlled Continue statin.  Depression: Controlled. Continue Celexa  This note has been created with Education officer, environmentalDragon speech recognition software and smart phrase technology. Any transcriptional errors are unintentional.         Jaclyn ShaggyEnobong, Amao, MD. [facility] [phone] 08/03/2015, 10:19 AM

## 2015-08-03 NOTE — Progress Notes (Signed)
Patient has no complaints today She has been taking her metformin and checking her sugars with her meter She reports the rash on her arm is better

## 2015-08-04 LAB — MICROALBUMIN / CREATININE URINE RATIO
Creatinine, Urine: 21 mg/dL (ref 20–320)
Microalb, Ur: <0.2 mg/dL

## 2015-08-24 ENCOUNTER — Ambulatory Visit: Payer: Self-pay | Attending: Family Medicine

## 2015-10-18 ENCOUNTER — Other Ambulatory Visit: Payer: Self-pay | Admitting: Internal Medicine

## 2015-10-20 ENCOUNTER — Encounter: Payer: Self-pay | Admitting: Pharmacist

## 2015-11-09 ENCOUNTER — Encounter: Payer: Self-pay | Admitting: Family Medicine

## 2015-11-09 ENCOUNTER — Ambulatory Visit: Payer: Self-pay | Attending: Family Medicine | Admitting: Family Medicine

## 2015-11-09 VITALS — BP 121/72 | HR 55 | Temp 97.8°F | Resp 15 | Ht 62.0 in | Wt 111.2 lb

## 2015-11-09 DIAGNOSIS — E119 Type 2 diabetes mellitus without complications: Secondary | ICD-10-CM | POA: Insufficient documentation

## 2015-11-09 DIAGNOSIS — I10 Essential (primary) hypertension: Secondary | ICD-10-CM | POA: Insufficient documentation

## 2015-11-09 DIAGNOSIS — F32A Depression, unspecified: Secondary | ICD-10-CM

## 2015-11-09 DIAGNOSIS — R21 Rash and other nonspecific skin eruption: Secondary | ICD-10-CM | POA: Insufficient documentation

## 2015-11-09 DIAGNOSIS — E785 Hyperlipidemia, unspecified: Secondary | ICD-10-CM | POA: Insufficient documentation

## 2015-11-09 DIAGNOSIS — K219 Gastro-esophageal reflux disease without esophagitis: Secondary | ICD-10-CM | POA: Insufficient documentation

## 2015-11-09 DIAGNOSIS — Z79899 Other long term (current) drug therapy: Secondary | ICD-10-CM | POA: Insufficient documentation

## 2015-11-09 DIAGNOSIS — G4700 Insomnia, unspecified: Secondary | ICD-10-CM | POA: Insufficient documentation

## 2015-11-09 DIAGNOSIS — F329 Major depressive disorder, single episode, unspecified: Secondary | ICD-10-CM | POA: Insufficient documentation

## 2015-11-09 LAB — GLUCOSE, POCT (MANUAL RESULT ENTRY): POC Glucose: 91 mg/dl (ref 70–99)

## 2015-11-09 MED ORDER — HYDROCORTISONE 0.5 % EX CREA: 1.0000 "application " | TOPICAL_CREAM | Freq: Two times a day (BID) | CUTANEOUS | Status: DC

## 2015-11-09 MED ORDER — METFORMIN HCL 500 MG PO TABS: 500.0000 mg | ORAL_TABLET | Freq: Two times a day (BID) | ORAL | Status: DC

## 2015-11-09 MED ORDER — GABAPENTIN 300 MG PO CAPS: 300.0000 mg | ORAL_CAPSULE | Freq: Three times a day (TID) | ORAL | Status: DC

## 2015-11-09 MED ORDER — CALCIUM CARBONATE-VITAMIN D 500-200 MG-UNIT PO TABS: 1.0000 | ORAL_TABLET | Freq: Every day | ORAL | Status: DC

## 2015-11-09 MED ORDER — RANITIDINE HCL 150 MG PO TABS
150.0000 mg | ORAL_TABLET | Freq: Two times a day (BID) | ORAL | Status: DC
Start: 2015-11-09 — End: 2016-02-13

## 2015-11-09 MED ORDER — OMEGA-3-ACID ETHYL ESTERS 1 G PO CAPS: 1.0000 g | ORAL_CAPSULE | Freq: Every day | ORAL | Status: DC

## 2015-11-09 MED ORDER — ZOLPIDEM TARTRATE 5 MG PO TABS: 5.0000 mg | ORAL_TABLET | Freq: Every evening | ORAL | Status: DC | PRN

## 2015-11-09 MED ORDER — TRIAMTERENE-HCTZ 37.5-25 MG PO TABS: 1.0000 | ORAL_TABLET | Freq: Every day | ORAL | Status: DC

## 2015-11-09 MED ORDER — ATORVASTATIN CALCIUM 40 MG PO TABS: 40.0000 mg | ORAL_TABLET | Freq: Every day | ORAL | Status: DC

## 2015-11-09 MED ORDER — CITALOPRAM HYDROBROMIDE 20 MG PO TABS: 20.0000 mg | ORAL_TABLET | Freq: Every morning | ORAL | Status: DC

## 2015-11-09 NOTE — Progress Notes (Signed)
Subjective:  Patient ID: Danielle Richard, female    DOB: November 25, 1950  Age: 65 y.o. MRN: [id number]  CC: Follow-up and Diabetes   HPI Danielle Richard is a 65 year old female with a history of type 2 diabetes mellitus (A1c of 5.8), hypertension, hyperlipidemia, depression here today for a follow up visit and seen with a [name] language line. She was diagnosed with Type 2 DM with an A1c of 7.5 at her last office visit and she was placed on Metformin and has been to see the Clinical Pharmacist for Diabetic education. For hypercholesterolemia she remains on a statin and for depression she is doing well on Celexa  She complains of persisting insomnia despite being on trazodone and would like to try something else. She is also requesting refill of hydrocortisone which she uses for a rash on her hands.  Patient has No headache, No chest pain, No abdominal pain - No Nausea, No new weakness tingling or numbness, No Cough - SOB.  Outpatient Prescriptions Prior to Visit  Medication Sig Dispense Refill  . Blood Glucose Monitoring Suppl (TRUE METRIX METER) DEVI 1 each by Does not apply route 3 (three) times daily before meals. 1 Device 0  . cetirizine (ZYRTEC) 10 MG tablet TAKE 1 TABLET BY MOUTH DAILY. 30 tablet 3  . glucose blood (TRUE METRIX BLOOD GLUCOSE TEST) test strip Use 3 times daily before meals 60 each 12  . TRUEPLUS LANCETS 28G MISC 1 each by Does not apply route 3 (three) times daily before meals. 100 each 12  . atorvastatin (LIPITOR) 40 MG tablet Take 1 tablet (40 mg total) by mouth daily. 30 tablet 3  . calcium-vitamin D (OSCAL WITH D) 500-200 MG-UNIT per tablet Take 1 tablet by mouth daily with breakfast. 60 tablet 3  . citalopram (CELEXA) 20 MG tablet Take 1 tablet (20 mg total) by mouth every morning. 60 tablet 3  . gabapentin (NEURONTIN) 300 MG capsule Take 1 capsule (300 mg total) by mouth 3 (three) times daily. 90 capsule 3  . hydrocortisone cream 0.5 % Apply 1 application topically 2  (two) times daily. 30 g 0  . metFORMIN (GLUCOPHAGE) 500 MG tablet Take 1 tablet (500 mg total) by mouth 2 (two) times daily with a meal. 60 tablet 3  . omega-3 acid ethyl esters (LOVAZA) 1 G capsule Take 1 capsule (1 g total) by mouth daily. 90 capsule 3  . ranitidine (ZANTAC) 150 MG tablet TAKE 1/2 TABLET BY MOUTH 2 TIMES DAILY. 15 tablet 2  . traZODone (DESYREL) 100 MG tablet Take 1 tablet (100 mg total) by mouth at bedtime. 30 tablet 3  . triamterene-hydrochlorothiazide (MAXZIDE-25) 37.5-25 MG tablet TAKE 1 TABLET BY MOUTH DAILY. 90 tablet 0   No facility-administered medications prior to visit.    ROS Review of Systems  Constitutional: Negative for activity change, appetite change and fatigue.  HENT: Negative for congestion, sinus pressure and sore throat.   Eyes: Negative for visual disturbance.  Respiratory: Negative for cough, chest tightness, shortness of breath and wheezing.   Cardiovascular: Negative for chest pain and palpitations.  Gastrointestinal: Negative for abdominal pain, constipation and abdominal distention.  Endocrine: Negative for polydipsia.  Genitourinary: Negative for dysuria and frequency.  Musculoskeletal: Negative for back pain and arthralgias.  Skin: Positive for rash.  Neurological: Negative for tremors, light-headedness and numbness.  Hematological: Does not bruise/bleed easily.  Psychiatric/Behavioral: Positive for sleep disturbance. Negative for behavioral problems and agitation.    Objective:  BP 121/72 mmHg  Pulse 55  Temp(Src) 97.8 F (36.6 C)  Resp 15  Ht  (1.575 m)  Wt 111 lb 3.2 oz (50.44 kg)  BMI 20.33 kg/m2  SpO2 98%  BP/Weight 11/09/2015 08/03/2015 07/13/2015  Systolic BP 121 131 139  Diastolic BP 72 74 73  Wt. (Lbs) 111.2 116.6 119.8  BMI 20.33 21.32 21.91      Physical Exam  Constitutional: She is oriented to person, place, and time. She appears well-developed and well-nourished.  Cardiovascular: Normal heart sounds and  intact distal pulses.  Bradycardia present.   No murmur heard. Pulmonary/Chest: Effort normal and breath sounds normal. She has no wheezes. She has no rales. She exhibits no tenderness.  Abdominal: Soft. Bowel sounds are normal. She exhibits no distension and no mass. There is no tenderness.  Musculoskeletal: Normal range of motion.  Neurological: She is alert and oriented to person, place, and time.  Skin:  Pinpoint rash on dorsum of both hands  Psychiatric: She has a normal mood and affect.    Lipid Panel     Component Value Date/Time   CHOL 136 07/20/2015 1044   TRIG 120 07/20/2015 1044   HDL 45* 07/20/2015 1044   CHOLHDL 3.0 07/20/2015 1044   VLDL 24 07/20/2015 1044   LDLCALC 67 07/20/2015 1044      Assessment & Plan:   1. Type 2 diabetes mellitus without complication, without long-term current use of insulin (HCC) Controlled with A1c of 5.8 - Glucose (CBG) - HgB A1c - gabapentin (NEURONTIN) 300 MG capsule; Take 1 capsule (300 mg total) by mouth 3 (three) times daily.  Dispense: 90 capsule; Refill: 3 - metFORMIN (GLUCOPHAGE) 500 MG tablet; Take 1 tablet (500 mg total) by mouth 2 (two) times daily with a meal.  Dispense: 60 tablet; Refill: 3  2. Essential hypertension Controlled - triamterene-hydrochlorothiazide (MAXZIDE-25) 37.5-25 MG tablet; Take 1 tablet by mouth daily.  Dispense: 30 tablet; Refill: 3  3. Insomnia Uncontrolled on trazodone which I have discontinued and replaced with Ambien - zolpidem (AMBIEN) 5 MG tablet; Take 1 tablet (5 mg total) by mouth at bedtime as needed for sleep.  Dispense: 30 tablet; Refill: 2  4. Gastroesophageal reflux disease without esophagitis Controlled - ranitidine (ZANTAC) 150 MG tablet; Take 1 tablet (150 mg total) by mouth 2 (two) times daily.  Dispense: 60 tablet; Refill: 2  5. Rash and nonspecific skin eruption Current flare-unknown etiology - hydrocortisone cream 0.5 %; Apply 1 application topically 2 (two) times daily.   Dispense: 30 g; Refill: 2  6. Dyslipidemia Controlled - omega-3 acid ethyl esters (LOVAZA) 1 g capsule; Take 1 capsule (1 g total) by mouth daily.  Dispense: 90 capsule; Refill: 3 - atorvastatin (LIPITOR) 40 MG tablet; Take 1 tablet (40 mg total) by mouth daily.  Dispense: 30 tablet; Refill: 3  7. Depression Controlled - citalopram (CELEXA) 20 MG tablet; Take 1 tablet (20 mg total) by mouth every morning.  Dispense: 60 tablet; Refill: 3   Meds ordered this encounter  Medications  . ranitidine (ZANTAC) 150 MG tablet    Sig: Take 1 tablet (150 mg total) by mouth 2 (two) times daily.    Dispense:  60 tablet    Refill:  2  . zolpidem (AMBIEN) 5 MG tablet    Sig: Take 1 tablet (5 mg total) by mouth at bedtime as needed for sleep.    Dispense:  30 tablet    Refill:  2  . gabapentin (NEURONTIN) 300 MG capsule  Sig: Take 1 capsule (300 mg total) by mouth 3 (three) times daily.    Dispense:  90 capsule    Refill:  3  . omega-3 acid ethyl esters (LOVAZA) 1 g capsule    Sig: Take 1 capsule (1 g total) by mouth daily.    Dispense:  90 capsule    Refill:  3  . metFORMIN (GLUCOPHAGE) 500 MG tablet    Sig: Take 1 tablet (500 mg total) by mouth 2 (two) times daily with a meal.    Dispense:  60 tablet    Refill:  3  . calcium-vitamin D (OSCAL WITH D) 500-200 MG-UNIT tablet    Sig: Take 1 tablet by mouth daily with breakfast.    Dispense:  60 tablet    Refill:  3  . hydrocortisone cream 0.5 %    Sig: Apply 1 application topically 2 (two) times daily.    Dispense:  30 g    Refill:  2  . triamterene-hydrochlorothiazide (MAXZIDE-25) 37.5-25 MG tablet    Sig: Take 1 tablet by mouth daily.    Dispense:  30 tablet    Refill:  3  . atorvastatin (LIPITOR) 40 MG tablet    Sig: Take 1 tablet (40 mg total) by mouth daily.    Dispense:  30 tablet    Refill:  3  . citalopram (CELEXA) 20 MG tablet    Sig: Take 1 tablet (20 mg total) by mouth every morning.    Dispense:  60 tablet    Refill:  3     Follow-up: Return in about 3 months (around 02/08/2016) for Follow-up of diabetes mellitus.   Jaclyn Shaggy MD

## 2015-11-09 NOTE — Patient Instructions (Signed)
Insomnia Insomnia is a sleep disorder that makes it difficult to fall asleep or to stay asleep. Insomnia can cause tiredness (fatigue), low energy, difficulty concentrating, mood swings, and poor performance at work or school.  There are three different ways to classify insomnia:  Difficulty falling asleep.  Difficulty staying asleep.  Waking up too early in the morning. Any type of insomnia can Ronne long-term (chronic) or short-term (acute). Both are common. Short-term insomnia usually lasts for three months or less. Chronic insomnia occurs at least three times a week for longer than three months. CAUSES  Insomnia may Gradie caused by another condition, situation, or substance, such as:  Anxiety.  Certain medicines.  Gastroesophageal reflux disease (GERD) or other gastrointestinal conditions.  Asthma or other breathing conditions.  Restless legs syndrome, sleep apnea, or other sleep disorders.  Chronic pain.  Menopause. This may include hot flashes.  Stroke.  Abuse of alcohol, tobacco, or illegal drugs.  Depression.  Caffeine.   Neurological disorders, such as Alzheimer disease.  An overactive thyroid (hyperthyroidism). The cause of insomnia may not Carole known. RISK FACTORS Risk factors for insomnia include:  Gender. Women are more commonly affected than men.  Age. Insomnia is more common as you get older.  Stress. This may involve your professional or personal life.  Income. Insomnia is more common in people with lower income.  Lack of exercise.   Irregular work schedule or night shifts.  Traveling between different time zones. SIGNS AND SYMPTOMS If you have insomnia, trouble falling asleep or trouble staying asleep is the main symptom. This may lead to other symptoms, such as:  Feeling fatigued.  Feeling nervous about going to sleep.  Not feeling rested in the morning.  Having trouble concentrating.  Feeling irritable, anxious, or depressed. TREATMENT   Treatment for insomnia depends on the cause. If your insomnia is caused by an underlying condition, treatment will focus on addressing the condition. Treatment may also include:   Medicines to help you sleep.  Counseling or therapy.  Lifestyle adjustments. HOME CARE INSTRUCTIONS   Take medicines only as directed by your health care provider.  Keep regular sleeping and waking hours. Avoid naps.  Keep a sleep diary to help you and your health care provider figure out what could [place] causing your insomnia. Include:   When you sleep.  When you wake up during the night.  How well you sleep.   How rested you feel the next day.  Any side effects of medicines you are taking.  What you eat and drink.   Make your bedroom a comfortable place where it is easy to fall asleep:  Put up shades or special blackout curtains to block light from outside.  Use a white noise machine to block noise.  Keep the temperature cool.   Exercise regularly as directed by your health care provider. Avoid exercising right before bedtime.  Use relaxation techniques to manage stress. Ask your health care provider to suggest some techniques that may work well for you. These may include:  Breathing exercises.  Routines to release muscle tension.  Visualizing peaceful scenes.  Cut back on alcohol, caffeinated beverages, and cigarettes, especially close to bedtime. These can disrupt your sleep.  Do not overeat or eat spicy foods right before bedtime. This can lead to digestive discomfort that can make it hard for you to sleep.  Limit screen use before bedtime. This includes:  Watching TV.  Using your smartphone, tablet, and computer.  Stick to a routine. This   can help you fall asleep faster. Try to do a quiet activity, brush your teeth, and go to bed at the same time each night.  Get out of bed if you are still awake after 15 minutes of trying to sleep. Keep the lights down, but try reading or  doing a quiet activity. When you feel sleepy, go back to bed.  Make sure that you drive carefully. Avoid driving if you feel very sleepy.  Keep all follow-up appointments as directed by your health care provider. This is important. SEEK MEDICAL CARE IF:   You are tired throughout the day or have trouble in your daily routine due to sleepiness.  You continue to have sleep problems or your sleep problems get worse. SEEK IMMEDIATE MEDICAL CARE IF:   You have serious thoughts about hurting yourself or someone else.   This information is not intended to replace advice given to you by your health care provider. Make sure you discuss any questions you have with your health care provider.   Document Released: 07/20/2000 Document Revised: 04/13/2015 Document Reviewed: 04/23/2014 Elsevier Interactive Patient Education 2016 Else[employer] Inc.  

## 2015-11-09 NOTE — Progress Notes (Signed)
Patient here for follow up on her DM States the trazodone is not helping with her insomnia Needs refill on diabetes medication

## 2015-12-13 ENCOUNTER — Other Ambulatory Visit: Payer: Self-pay | Admitting: Family Medicine

## 2015-12-21 ENCOUNTER — Telehealth: Payer: Self-pay | Admitting: Family Medicine

## 2015-12-21 NOTE — Telephone Encounter (Signed)
Pt. Called stating that she was prescribed zolpidem (AMBIEN) 5 MG tablet, and it is not helping her with her sleep.   °She would like to Teresina switched to Trazodone b/c it looks like it's helping her. Pt. Would like a refill on Trazodone.    °Please f/u with pt.  °

## 2015-12-27 ENCOUNTER — Other Ambulatory Visit: Payer: Self-pay | Admitting: Family Medicine

## 2015-12-27 NOTE — Telephone Encounter (Signed)
Pt. Called stating that she was prescribed zolpidem (AMBIEN) 5 MG tablet, and it is not helping her with her sleep.   °She would like to Yeny switched to Trazodone b/c it looks like it's helping her. Pt. Would like a refill on Trazodone.    °Please f/u with pt.  °

## 2015-12-29 ENCOUNTER — Other Ambulatory Visit: Payer: Self-pay | Admitting: Internal Medicine

## 2015-12-29 NOTE — Telephone Encounter (Signed)
Pt. Called stating that she was prescribed zolpidem (AMBIEN) 5 MG tablet, and it is not helping her with her sleep.  She would like to Luci switched to Trazodone b/c it looks like it's helping her. Pt. Would like a refill on Trazodone.  Please f/u with pt.

## 2016-01-04 ENCOUNTER — Ambulatory Visit: Payer: Self-pay | Attending: Family Medicine | Admitting: Pharmacist

## 2016-01-04 ENCOUNTER — Ambulatory Visit: Payer: Self-pay | Admitting: Pharmacist

## 2016-01-04 DIAGNOSIS — Z0000 Encounter for general adult medical examination without abnormal findings: Secondary | ICD-10-CM

## 2016-01-04 MED ORDER — TRAZODONE HCL 100 MG PO TABS: 100.0000 mg | ORAL_TABLET | Freq: Every day | ORAL | Status: DC

## 2016-01-04 NOTE — Progress Notes (Signed)
S:    Patient arrives with daughter and grandson.    Presents to the clinic to get more information about the citalopram and trazodone drug-drug interaction.   Patient feels like trazodone was helping her sleep and she would like to continue on that medication.    O:   Last 3 Office BP readings: BP Readings from Last 3 Encounters:  11/09/15 121/72  08/03/15 131/74  07/13/15 139/73    BMET    Component Value Date/Time   NA 145 07/20/2015 1044   K 5.0 07/20/2015 1044   CL 103 07/20/2015 1044   CO2 31 07/20/2015 1044   GLUCOSE 103* 07/20/2015 1044   BUN 15 07/20/2015 1044   CREATININE 0.81 07/20/2015 1044   CREATININE 0.75 09/19/2012 1214   CALCIUM 10.0 07/20/2015 1044   GFRNONAA 78 07/20/2015 1044   GFRNONAA >90 09/19/2012 1214   GFRAA 89 07/20/2015 1044   GFRAA >90 09/19/2012 1214    A/P: Medication management: reviewed the drug-drug interaction of citalopram and trazodone with patient. Using both citalopram and trazodone increases the risk of prolonged QT and serotonin syndrome. Reviewed both of these with patient and the s/sx of each. Patient and daughter verbalized understanding and would like to continue on both medications.   Results reviewed and written information provided.   Total time in face-to-face counseling 10 minutes.  F/U Clinic Visit with Dr. Venetia NightAmao.

## 2016-02-13 ENCOUNTER — Other Ambulatory Visit: Payer: Self-pay | Admitting: Family Medicine

## 2016-02-15 ENCOUNTER — Ambulatory Visit: Payer: No Typology Code available for payment source | Attending: Family Medicine

## 2016-03-13 ENCOUNTER — Other Ambulatory Visit: Payer: Self-pay | Admitting: Family Medicine

## 2016-03-13 DIAGNOSIS — E785 Hyperlipidemia, unspecified: Secondary | ICD-10-CM

## 2016-03-13 DIAGNOSIS — I10 Essential (primary) hypertension: Secondary | ICD-10-CM

## 2016-03-13 DIAGNOSIS — E119 Type 2 diabetes mellitus without complications: Secondary | ICD-10-CM

## 2016-04-13 ENCOUNTER — Other Ambulatory Visit: Payer: Self-pay | Admitting: Family Medicine

## 2016-04-13 DIAGNOSIS — I10 Essential (primary) hypertension: Secondary | ICD-10-CM

## 2016-04-13 DIAGNOSIS — E119 Type 2 diabetes mellitus without complications: Secondary | ICD-10-CM

## 2016-04-13 DIAGNOSIS — E785 Hyperlipidemia, unspecified: Secondary | ICD-10-CM

## 2016-05-14 ENCOUNTER — Other Ambulatory Visit: Payer: Self-pay | Admitting: Family Medicine

## 2016-05-14 DIAGNOSIS — E119 Type 2 diabetes mellitus without complications: Secondary | ICD-10-CM

## 2016-05-14 DIAGNOSIS — I10 Essential (primary) hypertension: Secondary | ICD-10-CM

## 2016-06-11 ENCOUNTER — Other Ambulatory Visit: Payer: Self-pay | Admitting: Family Medicine

## 2016-06-11 DIAGNOSIS — E119 Type 2 diabetes mellitus without complications: Secondary | ICD-10-CM

## 2016-07-11 ENCOUNTER — Ambulatory Visit: Payer: Self-pay | Attending: Family Medicine | Admitting: Family Medicine

## 2016-07-11 ENCOUNTER — Other Ambulatory Visit: Payer: Self-pay

## 2016-07-11 ENCOUNTER — Telehealth: Payer: Self-pay | Admitting: Family Medicine

## 2016-07-11 ENCOUNTER — Encounter: Payer: Self-pay | Admitting: Family Medicine

## 2016-07-11 VITALS — BP 154/84 | HR 61 | Temp 98.3°F | Ht 62.0 in | Wt 109.8 lb

## 2016-07-11 DIAGNOSIS — Z86010 Personal history of colonic polyps: Secondary | ICD-10-CM | POA: Insufficient documentation

## 2016-07-11 DIAGNOSIS — F339 Major depressive disorder, recurrent, unspecified: Secondary | ICD-10-CM | POA: Insufficient documentation

## 2016-07-11 DIAGNOSIS — E785 Hyperlipidemia, unspecified: Secondary | ICD-10-CM | POA: Insufficient documentation

## 2016-07-11 DIAGNOSIS — E1100 Type 2 diabetes mellitus with hyperosmolarity without nonketotic hyperglycemic-hyperosmolar coma (NKHHC): Secondary | ICD-10-CM

## 2016-07-11 DIAGNOSIS — G4700 Insomnia, unspecified: Secondary | ICD-10-CM | POA: Insufficient documentation

## 2016-07-11 DIAGNOSIS — R0789 Other chest pain: Secondary | ICD-10-CM | POA: Insufficient documentation

## 2016-07-11 DIAGNOSIS — K219 Gastro-esophageal reflux disease without esophagitis: Secondary | ICD-10-CM | POA: Insufficient documentation

## 2016-07-11 DIAGNOSIS — E119 Type 2 diabetes mellitus without complications: Secondary | ICD-10-CM | POA: Insufficient documentation

## 2016-07-11 DIAGNOSIS — F330 Major depressive disorder, recurrent, mild: Secondary | ICD-10-CM

## 2016-07-11 DIAGNOSIS — G4709 Other insomnia: Secondary | ICD-10-CM

## 2016-07-11 DIAGNOSIS — Z1159 Encounter for screening for other viral diseases: Secondary | ICD-10-CM

## 2016-07-11 DIAGNOSIS — I10 Essential (primary) hypertension: Secondary | ICD-10-CM | POA: Insufficient documentation

## 2016-07-11 DIAGNOSIS — Z23 Encounter for immunization: Secondary | ICD-10-CM

## 2016-07-11 DIAGNOSIS — Z7984 Long term (current) use of oral hypoglycemic drugs: Secondary | ICD-10-CM | POA: Insufficient documentation

## 2016-07-11 LAB — GLUCOSE, POCT (MANUAL RESULT ENTRY): POC Glucose: 108 mg/dl — AB (ref 70–99)

## 2016-07-11 LAB — COMPLETE METABOLIC PANEL WITH GFR
ALT: 14 U/L (ref 6–29)
AST: 21 U/L (ref 10–35)
Albumin: 4.8 g/dL (ref 3.6–5.1)
Alkaline Phosphatase: 51 U/L (ref 33–130)
BUN: 19 mg/dL (ref 7–25)
CO2: 30 mmol/L (ref 20–31)
Calcium: 9.8 mg/dL (ref 8.6–10.4)
Chloride: 101 mmol/L (ref 98–110)
Creat: 0.98 mg/dL (ref 0.50–0.99)
GFR, Est African American: 71 mL/min (ref >=60)
GFR, Est Non African American: 61 mL/min (ref >=60)
Glucose, Bld: 103 mg/dL — ABNORMAL HIGH (ref 65–99)
Potassium: 4.2 mmol/L (ref 3.5–5.3)
Sodium: 143 mmol/L (ref 135–146)
Total Bilirubin: 0.5 mg/dL (ref 0.2–1.2)
Total Protein: 7.5 g/dL (ref 6.1–8.1)

## 2016-07-11 LAB — LIPID PANEL
Cholesterol: 133 mg/dL (ref <200)
HDL: 51 mg/dL (ref >50)
LDL Cholesterol: 55 mg/dL (ref <100)
Total CHOL/HDL Ratio: 2.6 Ratio (ref <5.0)
Triglycerides: 133 mg/dL (ref <150)
VLDL: 27 mg/dL (ref <30)

## 2016-07-11 LAB — POCT GLYCOSYLATED HEMOGLOBIN (HGB A1C): Hemoglobin A1C: 5.8

## 2016-07-11 MED ORDER — TRAZODONE HCL 100 MG PO TABS
100.0000 mg | ORAL_TABLET | Freq: Every day | ORAL | 5 refills | Status: AC
Start: 2016-07-11 — End: ?

## 2016-07-11 MED ORDER — ATORVASTATIN CALCIUM 40 MG PO TABS: 40.0000 mg | ORAL_TABLET | Freq: Every day | ORAL | 5 refills | Status: DC

## 2016-07-11 MED ORDER — CITALOPRAM HYDROBROMIDE 20 MG PO TABS: 20.0000 mg | ORAL_TABLET | Freq: Every morning | ORAL | 3 refills | Status: AC

## 2016-07-11 MED ORDER — GABAPENTIN 300 MG PO CAPS: 300.0000 mg | ORAL_CAPSULE | Freq: Three times a day (TID) | ORAL | 3 refills | Status: DC

## 2016-07-11 MED ORDER — RANITIDINE HCL 150 MG PO TABS: 150.0000 mg | ORAL_TABLET | Freq: Two times a day (BID) | ORAL | 5 refills | Status: DC

## 2016-07-11 MED ORDER — TRIAMTERENE-HCTZ 37.5-25 MG PO TABS: 1.0000 | ORAL_TABLET | Freq: Every day | ORAL | 5 refills | Status: DC

## 2016-07-11 MED ORDER — METFORMIN HCL 500 MG PO TABS: ORAL_TABLET | ORAL | 5 refills | Status: AC

## 2016-07-11 NOTE — Patient Instructions (Signed)
Diabetes Mellitus and Food It is important for you to manage your blood sugar (glucose) level. Your blood glucose level can Kerriann greatly affected by what you eat. Eating healthier foods in the appropriate amounts throughout the day at about the same time each day will help you control your blood glucose level. It can also help slow or prevent worsening of your diabetes mellitus. Healthy eating may even help you improve the level of your blood pressure and reach or maintain a healthy weight. General recommendations for healthful eating and cooking habits include:  Eating meals and snacks regularly. Avoid going long periods of time without eating to lose weight.  Eating a diet that consists mainly of plant-based foods, such as fruits, vegetables, nuts, legumes, and whole grains.  Using low-heat cooking methods, such as baking, instead of high-heat cooking methods, such as deep frying.  Work with your dietitian to make sure you understand how to use the Nutrition Facts information on food labels. How can food affect me? Carbohydrates Carbohydrates affect your blood glucose level more than any other type of food. Your dietitian will help you determine how many carbohydrates to eat at each meal and teach you how to count carbohydrates. Counting carbohydrates is important to keep your blood glucose at a healthy level, especially if you are using insulin or taking certain medicines for diabetes mellitus. Alcohol Alcohol can cause sudden decreases in blood glucose (hypoglycemia), especially if you use insulin or take certain medicines for diabetes mellitus. Hypoglycemia can Dung a life-threatening condition. Symptoms of hypoglycemia (sleepiness, dizziness, and disorientation) are similar to symptoms of having too much alcohol. If your health care provider has given you approval to drink alcohol, do so in moderation and use the following guidelines:  Women should not have more than one drink per day, and men  should not have more than two drinks per day. One drink is equal to: ? 12 oz of beer. ? 5 oz of wine. ? 1 oz of hard liquor.  Do not drink on an empty stomach.  Keep yourself hydrated. Have water, diet soda, or unsweetened iced tea.  Regular soda, juice, and other mixers might contain a lot of carbohydrates and should Violette counted.  What foods are not recommended? As you make food choices, it is important to remember that all foods are not the same. Some foods have fewer nutrients per serving than other foods, even though they might have the same number of calories or carbohydrates. It is difficult to get your body what it needs when you eat foods with fewer nutrients. Examples of foods that you should avoid that are high in calories and carbohydrates but low in nutrients include:  Trans fats (most processed foods list trans fats on the Nutrition Facts label).  Regular soda.  Juice.  Candy.  Sweets, such as cake, pie, doughnuts, and cookies.  Fried foods.  What foods can I eat? Eat nutrient-rich foods, which will nourish your body and keep you healthy. The food you should eat also will depend on several factors, including:  The calories you need.  The medicines you take.  Your weight.  Your blood glucose level.  Your blood pressure level.  Your cholesterol level.  You should eat a variety of foods, including:  Protein. ? Lean cuts of meat. ? Proteins low in saturated fats, such as fish, egg whites, and beans. Avoid processed meats.  Fruits and vegetables. ? Fruits and vegetables that may help control blood glucose levels, such as apples,   mangoes, and yams.  Dairy products. ? Choose fat-free or low-fat dairy products, such as milk, yogurt, and cheese.  Grains, bread, pasta, and rice. ? Choose whole grain products, such as multigrain bread, whole oats, and brown rice. These foods may help control blood pressure.  Fats. ? Foods containing healthful fats, such as  nuts, avocado, olive oil, canola oil, and fish.  Does everyone with diabetes mellitus have the same meal plan? Because every person with diabetes mellitus is different, there is not one meal plan that works for everyone. It is very important that you meet with a dietitian who will help you create a meal plan that is just right for you. This information is not intended to replace advice given to you by your health care provider. Make sure you discuss any questions you have with your health care provider. Document Released: 04/19/2005 Document Revised: 12/29/2015 Document Reviewed: 06/19/2013 Elsevier Interactive Patient Education  2017 Elsevier Inc.  

## 2016-07-11 NOTE — Progress Notes (Signed)
Flu shot request

## 2016-07-11 NOTE — Progress Notes (Signed)
Subjective:  Patient ID: Danielle Richard, female    DOB: 12-13-1950  Age: 65 y.o. MRN: [id number]  CC: Diabetes; Hypertension; and dyslipidema   HPI Danielle Richard is a 65 year old female with a history of type 2 diabetes mellitus (A1c of 5.8), hypertension, hyperlipidemia, depression here today for a follow up visit accompanied by her son-in-law.  She has been compliant with her medications but is out of her antihypertensive. Complies with a diabetic, low cholesterol diet, low-sodium diet and walks for exercise. Denies any side effects from medications and is tolerating them.  Complains of a 6-7 year history of left-sided chest pain unrelated to activity which is absent at this time. Denies alleviating or aggravating factors.  Would like to receive the flu shot today  Past Medical History:  Diagnosis Date  . Aortic insufficiency   . Depression   . GERD (gastroesophageal reflux disease)   . HLD (hyperlipidemia)   . Hypertension   . Insomnia   . Personal history of colonic polyps     Past Surgical History:  Procedure Laterality Date  . COLONOSCOPY WITH PROPOFOL N/A 09/29/2013   Procedure: COLONOSCOPY WITH PROPOFOL;  Surgeon: Charolett BumpersMartin K Johnson, MD;  Location: [facility];  Service: Endoscopy;  Laterality: N/A;  . ESOPHAGOGASTRODUODENOSCOPY (EGD) WITH PROPOFOL N/A 09/29/2013   Procedure: ESOPHAGOGASTRODUODENOSCOPY (EGD) WITH PROPOFOL;  Surgeon: Charolett BumpersMartin K Johnson, MD;  Location: [facility];  Service: Endoscopy;  Laterality: N/A;  . NO PAST SURGERIES      Allergies  Allergen Reactions  . Tylenol [Acetaminophen]   . Aspirin Rash     Outpatient Medications Prior to Visit  Medication Sig Dispense Refill  . Blood Glucose Monitoring Suppl (TRUE METRIX METER) DEVI 1 each by Does not apply route 3 (three) times daily before meals. 1 Device 0  . calcium-vitamin D (OSCAL WITH D) 500-200 MG-UNIT tablet Take 1 tablet by mouth daily with breakfast. 60 tablet 3  . cetirizine (ZYRTEC) 10 MG  tablet TAKE 1 TABLET (10 MG TOTAL) BY MOUTH DAILY. 30 tablet 3  . glucose blood (TRUE METRIX BLOOD GLUCOSE TEST) test strip Use 3 times daily before meals 60 each 12  . omega-3 acid ethyl esters (LOVAZA) 1 g capsule Take 1 capsule (1 g total) by mouth daily. 90 capsule 3  . TRUEPLUS LANCETS 28G MISC 1 each by Does not apply route 3 (three) times daily before meals. 100 each 12  . atorvastatin (LIPITOR) 40 MG tablet TAKE 1 TABLET BY MOUTH DAILY. 30 tablet 0  . citalopram (CELEXA) 20 MG tablet Take 1 tablet (20 mg total) by mouth every morning. 60 tablet 3  . gabapentin (NEURONTIN) 300 MG capsule Take 1 capsule (300 mg total) by mouth 3 (three) times daily. 90 capsule 3  . metFORMIN (GLUCOPHAGE) 500 MG tablet TAKE 1 TABLET (500 MG TOTAL) BY MOUTH 2 TIMES DAILY WITH A MEAL. 60 tablet 0  . ranitidine (ZANTAC) 150 MG tablet TAKE 1 TABLET BY MOUTH 2 TIMES DAILY. 60 tablet 0  . traZODone (DESYREL) 100 MG tablet TAKE 1 TABLET BY MOUTH AT BEDTIME. 30 tablet 0  . triamterene-hydrochlorothiazide (MAXZIDE-25) 37.5-25 MG tablet Take 1 tablet by mouth daily. 30 tablet 0  . hydrocortisone cream 0.5 % Apply 1 application topically 2 (two) times daily. (Patient not taking: Reported on 07/11/2016) 30 g 2   No facility-administered medications prior to visit.     ROS Review of Systems  Constitutional: Negative for activity change, appetite change and fatigue.  HENT: Negative for  congestion, sinus pressure and sore throat.   Eyes: Negative for visual disturbance.  Respiratory: Negative for cough, chest tightness, shortness of breath and wheezing.   Cardiovascular: Positive for chest pain. Negative for palpitations.  Gastrointestinal: Negative for abdominal distention, abdominal pain and constipation.  Endocrine: Negative for polydipsia.  Genitourinary: Negative for dysuria and frequency.  Musculoskeletal: Negative for arthralgias and back pain.  Skin: Negative for rash.  Neurological: Negative for tremors,  light-headedness and numbness.  Hematological: Does not bruise/bleed easily.  Psychiatric/Behavioral: Negative for agitation and behavioral problems.    Objective:  BP (!) 154/84 (BP Location: Right Arm, Patient Position: Sitting, Cuff Size: Small)   Pulse 61   Temp 98.3 F (36.8 C) (Oral)   Ht 5\' 2"  (1.575 m)   Wt 109 lb 12.8 oz (49.8 kg)   SpO2 97%   BMI 20.08 kg/m   BP/Weight 07/11/2016 11/09/2015 08/03/2015  Systolic BP 154 121 131  Diastolic BP 84 72 74  Wt. (Lbs) 109.8 111.2 116.6  BMI 20.08 20.33 21.32      Physical Exam  Constitutional: She is oriented to person, place, and time. She appears well-developed and well-nourished.  Neck: No JVD present.  Cardiovascular: Normal rate, normal heart sounds and intact distal pulses.   No murmur heard. Pulmonary/Chest: Effort normal and breath sounds normal. She has no wheezes. She has no rales. She exhibits no tenderness.  Abdominal: Soft. Bowel sounds are normal. She exhibits no distension and no mass. There is no tenderness.  Musculoskeletal: Normal range of motion.  Neurological: She is alert and oriented to person, place, and time.  Skin: Skin is warm and dry.  Psychiatric: She has a normal mood and affect.    Lab Results  Component Value Date   HGBA1C 5.8 07/11/2016    Assessment & Plan:   1. Type 2 diabetes mellitus with hyperosmolarity without coma, without long-term current use of insulin (HCC) Controlled with A1c of 5.8 which is down from 7.5 one year ago Continue ADA diet - Glucose (CBG) - HgB A1c  2. Need for hepatitis C screening test - Hepatitis C antibody, reflex  3. Essential hypertension Uncontrolled due to running out of medications Low-sodium diet emphasized - triamterene-hydrochlorothiazide (MAXZIDE-25) 37.5-25 MG tablet; Take 1 tablet by mouth daily.  Dispense: 30 tablet; Refill: 5  4. Type 2 diabetes mellitus without complication, without long-term current use of insulin (HCC) - metFORMIN  (GLUCOPHAGE) 500 MG tablet; TAKE 1 TABLET (500 MG TOTAL) BY MOUTH 2 TIMES DAILY WITH A MEAL.  Dispense: 60 tablet; Refill: 5 - gabapentin (NEURONTIN) 300 MG capsule; Take 1 capsule (300 mg total) by mouth 3 (three) times daily.  Dispense: 90 capsule; Refill: 3 - COMPLETE METABOLIC PANEL WITH GFR - Lipid panel  5. Mild episode of recurrent major depressive disorder (HCC) Stable - citalopram (CELEXA) 20 MG tablet; Take 1 tablet (20 mg total) by mouth every morning.  Dispense: 60 tablet; Refill: 3  6. Dyslipidemia Lipid panel today - atorvastatin (LIPITOR) 40 MG tablet; Take 1 tablet (40 mg total) by mouth daily.  Dispense: 30 tablet; Refill: 5  7. Gastroesophageal reflux disease without esophagitis Controlled - ranitidine (ZANTAC) 150 MG tablet; Take 1 tablet (150 mg total) by mouth 2 (two) times daily.  Dispense: 60 tablet; Refill: 5  8. Other insomnia Controlled on trazodone  9. Atypical chest pain Chronic duration-6-7 years, sent at this time Likely musculoskeletal-advised to use ibuprofen EKG-poor quality, nonspecific ST changes   Meds ordered this encounter  Medications  .  triamterene-hydrochlorothiazide (MAXZIDE-25) 37.5-25 MG tablet    Sig: Take 1 tablet by mouth daily.    Dispense:  30 tablet    Refill:  5  . ranitidine (ZANTAC) 150 MG tablet    Sig: Take 1 tablet (150 mg total) by mouth 2 (two) times daily.    Dispense:  60 tablet    Refill:  5  . metFORMIN (GLUCOPHAGE) 500 MG tablet    Sig: TAKE 1 TABLET (500 MG TOTAL) BY MOUTH 2 TIMES DAILY WITH A MEAL.    Dispense:  60 tablet    Refill:  5    Must have office visit for refills  . gabapentin (NEURONTIN) 300 MG capsule    Sig: Take 1 capsule (300 mg total) by mouth 3 (three) times daily.    Dispense:  90 capsule    Refill:  3  . citalopram (CELEXA) 20 MG tablet    Sig: Take 1 tablet (20 mg total) by mouth every morning.    Dispense:  60 tablet    Refill:  3  . atorvastatin (LIPITOR) 40 MG tablet    Sig: Take  1 tablet (40 mg total) by mouth daily.    Dispense:  30 tablet    Refill:  5  . traZODone (DESYREL) 100 MG tablet    Sig: Take 1 tablet (100 mg total) by mouth at bedtime.    Dispense:  30 tablet    Refill:  5    Follow-up: Return in about 3 months (around 10/09/2016) for Follow-up on diabetes mellitus.   Jaclyn Shaggy MD

## 2016-07-12 LAB — HEPATITIS C ANTIBODY: HCV Ab: NEGATIVE

## 2016-07-19 ENCOUNTER — Telehealth: Payer: Self-pay

## 2016-07-19 NOTE — Telephone Encounter (Signed)
Writer called patient through [employer] per Dr. Venetia NightAmao and spoke with daughter about lab results.  Daughter stated understanding and will inform her mother.

## 2016-07-19 NOTE — Telephone Encounter (Signed)
-----   Message from Jaclyn ShaggyEnobong Amao, MD sent at 07/12/2016  8:44 AM EST ----- Please inform the patient that labs are normal. Thank you.

## 2016-08-02 ENCOUNTER — Ambulatory Visit: Payer: Self-pay | Attending: Family Medicine

## 2016-08-09 ENCOUNTER — Ambulatory Visit (INDEPENDENT_AMBULATORY_CARE_PROVIDER_SITE_OTHER): Payer: Self-pay

## 2016-08-09 ENCOUNTER — Ambulatory Visit (HOSPITAL_COMMUNITY)
Admission: EM | Admit: 2016-08-09 | Discharge: 2016-08-09 | Disposition: A | Discharge status: Home / Self Care | Payer: Self-pay | Attending: Emergency Medicine | Admitting: Emergency Medicine

## 2016-08-09 ENCOUNTER — Encounter (HOSPITAL_COMMUNITY): Payer: Self-pay | Admitting: Family Medicine

## 2016-08-09 DIAGNOSIS — J9801 Acute bronchospasm: Secondary | ICD-10-CM

## 2016-08-09 DIAGNOSIS — B9789 Other viral agents as the cause of diseases classified elsewhere: Secondary | ICD-10-CM

## 2016-08-09 DIAGNOSIS — J069 Acute upper respiratory infection, unspecified: Secondary | ICD-10-CM

## 2016-08-09 MED ORDER — ALBUTEROL SULFATE HFA 108 (90 BASE) MCG/ACT IN AERS: 2.0000 | INHALATION_SPRAY | RESPIRATORY_TRACT | 0 refills | Status: DC | PRN

## 2016-08-09 NOTE — Discharge Instructions (Signed)
The persistent cough is due to 2 front problems. One is a copious amount of drainage from the sinuses going down the back of her throat and into her upper most airway. The second is bronchospasm which is like of wheeze. This forces the body to cough and includes coughing spasms. Regular cough medicine does not help this type of cough. An inhaler or bronchodilator such his albuterol is the drug of choice for this type of cough. Allegra or Zyrtec daily as needed for drainage and runny nose. For stronger antihistamine may take Chlor-Trimeton 2 to 4 mg every 4 to 6 hours, may cause drowsiness. Saline nasal spray used frequently. Ibuprofen 400 mg every 6 hours as needed for pain, discomfort or fever. Drink plenty of fluids and stay well-hydrated. Use the albuterol inhaler 2 puffs every 4 hours as needed for persistent cough. Once the cough has improved he can decrease or stop using the inhaler. The chest x-ray did not show any sign of acute infection.

## 2016-08-09 NOTE — ED Provider Notes (Signed)
CSN: [id number]     Arrival date & time 08/09/16  8295 History   First MD Initiated Contact with Patient 08/09/16 1021     Chief Complaint  Patient presents with  . Cough   (Consider location/radiation/quality/duration/timing/severity/associated sxs/prior Treatment) 66 year old female is present with a significant other speaks English for her with complaints of a cough for 3 weeks. Also has increased mucus, scratchy sore throat, nasal stuffiness, PND, chest pain associated with cough only. Questionable undocumented fever off and on. Has taken some liquid OTC medicine, name unknown.      Past Medical History:  Diagnosis Date  . Aortic insufficiency   . Depression   . GERD (gastroesophageal reflux disease)   . HLD (hyperlipidemia)   . Hypertension   . Insomnia   . Personal history of colonic polyps    Past Surgical History:  Procedure Laterality Date  . COLONOSCOPY WITH PROPOFOL N/A 09/29/2013   Procedure: COLONOSCOPY WITH PROPOFOL;  Surgeon: Charolett Bumpers, MD;  Location: [facility];  Service: Endoscopy;  Laterality: N/A;  . ESOPHAGOGASTRODUODENOSCOPY (EGD) WITH PROPOFOL N/A 09/29/2013   Procedure: ESOPHAGOGASTRODUODENOSCOPY (EGD) WITH PROPOFOL;  Surgeon: Charolett Bumpers, MD;  Location: [facility];  Service: Endoscopy;  Laterality: N/A;  . NO PAST SURGERIES     Family History  Problem Relation Age of Onset  . CAD Mother    Social History  Substance Use Topics  . Smoking status: Never Smoker  . Smokeless tobacco: Never Used  . Alcohol use 1.2 oz/week    1 Glasses of wine, 1 Cans of beer per week     Comment: socially   OB History    No data available     Review of Systems  Constitutional: Negative for chills and diaphoresis.  HENT: Positive for congestion, postnasal drip, rhinorrhea and sore throat. Negative for ear pain.   Eyes: Negative.   Respiratory: Positive for cough and shortness of breath.   Cardiovascular: Negative.   Gastrointestinal: Negative.    Neurological: Negative.   All other systems reviewed and are negative.   Allergies  Tylenol [acetaminophen] and Aspirin  Home Medications   Prior to Admission medications   Medication Sig Start Date End Date Taking? Authorizing Provider  albuterol (PROVENTIL HFA;VENTOLIN HFA) 108 (90 Base) MCG/ACT inhaler Inhale 2 puffs into the lungs every 4 (four) hours as needed for wheezing or shortness of breath. 08/09/16   Hayden Rasmussen, NP  atorvastatin (LIPITOR) 40 MG tablet Take 1 tablet (40 mg total) by mouth daily. 07/11/16   Jaclyn Shaggy, MD  Blood Glucose Monitoring Suppl (TRUE METRIX METER) DEVI 1 each by Does not apply route 3 (three) times daily before meals. 07/13/15   Jaclyn Shaggy, MD  calcium-vitamin D (OSCAL WITH D) 500-200 MG-UNIT tablet Take 1 tablet by mouth daily with breakfast. 11/09/15   Jaclyn Shaggy, MD  cetirizine (ZYRTEC) 10 MG tablet TAKE 1 TABLET (10 MG TOTAL) BY MOUTH DAILY. 04/13/16   Jaclyn Shaggy, MD  citalopram (CELEXA) 20 MG tablet Take 1 tablet (20 mg total) by mouth every morning. 07/11/16   Jaclyn Shaggy, MD  gabapentin (NEURONTIN) 300 MG capsule Take 1 capsule (300 mg total) by mouth 3 (three) times daily. 07/11/16   Jaclyn Shaggy, MD  glucose blood (TRUE METRIX BLOOD GLUCOSE TEST) test strip Use 3 times daily before meals 07/13/15   Jaclyn Shaggy, MD  hydrocortisone cream 0.5 % Apply 1 application topically 2 (two) times daily. Patient not taking: Reported on 07/11/2016 11/09/15   Jaclyn Shaggy, MD  metFORMIN (GLUCOPHAGE) 500 MG tablet TAKE 1 TABLET (500 MG TOTAL) BY MOUTH 2 TIMES DAILY WITH A MEAL. 07/11/16   Jaclyn Shaggy, MD  omega-3 acid ethyl esters (LOVAZA) 1 g capsule Take 1 capsule (1 g total) by mouth daily. 11/09/15   Jaclyn Shaggy, MD  ranitidine (ZANTAC) 150 MG tablet Take 1 tablet (150 mg total) by mouth 2 (two) times daily. 07/11/16   Jaclyn Shaggy, MD  traZODone (DESYREL) 100 MG tablet Take 1 tablet (100 mg total) by mouth at bedtime. 07/11/16   Jaclyn Shaggy, MD   triamterene-hydrochlorothiazide (MAXZIDE-25) 37.5-25 MG tablet Take 1 tablet by mouth daily. 07/11/16   Jaclyn Shaggy, MD  TRUEPLUS LANCETS 28G MISC 1 each by Does not apply route 3 (three) times daily before meals. 07/13/15   Jaclyn Shaggy, MD   Meds Ordered and Administered this Visit  Medications - No data to display  BP (!) 127/43   Pulse 65   Temp 98.4 F (36.9 C)   Resp 18   SpO2 99%  No data found.   Physical Exam  Constitutional: She is oriented to person, place, and time. She appears well-developed and well-nourished. No distress.  HENT:  Right Ear: External ear normal.  Left Ear: External ear normal.  Mouth/Throat: No oropharyngeal exudate.  Bilateral TMs are normal. Oropharynx with much cobblestoning and moderate clear PND. Otherwise clear.  Eyes: EOM are normal.  Neck: Normal range of motion. Neck supple.  Cardiovascular: Normal rate, regular rhythm, normal heart sounds and intact distal pulses.   Pulmonary/Chest: Effort normal. No respiratory distress. She exhibits tenderness.  Lungs are clear with tidal volume however with forced cough there is bilateral diffuse coarseness and distant wheeze. Air movement is good.  Abdominal: No hernia.  Musculoskeletal: Normal range of motion. She exhibits no edema.  Neurological: She is alert and oriented to person, place, and time.  Skin: Skin is warm and dry.  Psychiatric: She has a normal mood and affect.  Nursing note and vitals reviewed.   Urgent Care Course   Clinical Course     Procedures (including critical care time)  Labs Review Labs Reviewed - No data to display  Imaging Review Dg Chest 2 View  Result Date: 08/09/2016 CLINICAL DATA:  Cough, dyspnea for 3 weeks EXAM: CHEST  2 VIEW COMPARISON:  None. FINDINGS: The heart size and mediastinal contours are within normal limits. Both lungs are clear. The visualized skeletal structures are unremarkable. IMPRESSION: No active cardiopulmonary disease. Electronically  Signed   By: Elige Ko   On: 08/09/2016 10:49     Visual Acuity Review  Right Eye Distance:   Left Eye Distance:   Bilateral Distance:    Right Eye Near:   Left Eye Near:    Bilateral Near:         MDM   1. Viral URI with cough   2. Cough due to bronchospasm    The persistent cough is due to 2 separate problems. One is a copious amount of drainage from the sinuses going down the back of her throat and into her upper most airway. The second is bronchospasm which is like of wheeze. This forces the body to cough and includes coughing spasms. Regular cough medicine does not help this type of cough. An inhaler or bronchodilator such his albuterol is the drug of choice for this type of cough. Allegra or Zyrtec daily as needed for drainage and runny nose. For stronger antihistamine may take Chlor-Trimeton 2 to 4 mg every  4 to 6 hours, may cause drowsiness. Saline nasal spray used frequently. Ibuprofen 400 mg every 6 hours as needed for pain, discomfort or fever. Drink plenty of fluids and stay well-hydrated. Use the albuterol inhaler 2 puffs every 4 hours as needed for persistent cough. Once the cough has improved he can decrease or stop using the inhaler. The chest x-ray did not show any sign of acute infection. Meds ordered this encounter  Medications  . albuterol (PROVENTIL HFA;VENTOLIN HFA) 108 (90 Base) MCG/ACT inhaler    Sig: Inhale 2 puffs into the lungs every 4 (four) hours as needed for wheezing or shortness of breath.    Dispense:  1 Inhaler    Refill:  0    Order Specific Question:   Supervising Provider    Answer:   Domenick GongMORTENSON, ASHLEY [[id number]]      Hayden Rasmussenavid Harmoney Sienkiewicz, NP 08/09/16 1108

## 2016-08-09 NOTE — ED Triage Notes (Signed)
Pt here for cough x 1 month and coughing up mucous.

## 2016-11-06 DIAGNOSIS — E785 Hyperlipidemia, unspecified: Secondary | ICD-10-CM | POA: Diagnosis not present

## 2016-11-06 DIAGNOSIS — E119 Type 2 diabetes mellitus without complications: Secondary | ICD-10-CM | POA: Diagnosis not present

## 2016-11-06 DIAGNOSIS — I10 Essential (primary) hypertension: Secondary | ICD-10-CM | POA: Diagnosis not present

## 2016-11-12 DIAGNOSIS — H524 Presbyopia: Secondary | ICD-10-CM | POA: Diagnosis not present

## 2016-11-12 DIAGNOSIS — H401211 Low-tension glaucoma, right eye, mild stage: Secondary | ICD-10-CM | POA: Diagnosis not present

## 2016-11-12 DIAGNOSIS — H401222 Low-tension glaucoma, left eye, moderate stage: Secondary | ICD-10-CM | POA: Diagnosis not present

## 2016-11-19 DIAGNOSIS — R739 Hyperglycemia, unspecified: Secondary | ICD-10-CM | POA: Diagnosis not present

## 2016-11-19 DIAGNOSIS — R079 Chest pain, unspecified: Secondary | ICD-10-CM | POA: Diagnosis not present

## 2016-11-19 DIAGNOSIS — I10 Essential (primary) hypertension: Secondary | ICD-10-CM | POA: Diagnosis not present

## 2016-11-19 DIAGNOSIS — R1013 Epigastric pain: Secondary | ICD-10-CM | POA: Diagnosis not present

## 2016-11-20 ENCOUNTER — Other Ambulatory Visit: Payer: Self-pay | Admitting: Internal Medicine

## 2016-11-20 DIAGNOSIS — Z780 Asymptomatic menopausal state: Secondary | ICD-10-CM | POA: Diagnosis not present

## 2016-11-20 DIAGNOSIS — R1013 Epigastric pain: Secondary | ICD-10-CM | POA: Diagnosis not present

## 2016-11-20 DIAGNOSIS — I10 Essential (primary) hypertension: Secondary | ICD-10-CM | POA: Diagnosis not present

## 2016-11-20 DIAGNOSIS — Z13220 Encounter for screening for lipoid disorders: Secondary | ICD-10-CM | POA: Diagnosis not present

## 2016-11-20 DIAGNOSIS — R079 Chest pain, unspecified: Secondary | ICD-10-CM | POA: Diagnosis not present

## 2016-11-20 DIAGNOSIS — M810 Age-related osteoporosis without current pathological fracture: Secondary | ICD-10-CM | POA: Diagnosis not present

## 2016-11-20 DIAGNOSIS — R739 Hyperglycemia, unspecified: Secondary | ICD-10-CM | POA: Diagnosis not present

## 2016-11-27 ENCOUNTER — Other Ambulatory Visit: Payer: Self-pay | Admitting: Internal Medicine

## 2016-11-27 DIAGNOSIS — Z1231 Encounter for screening mammogram for malignant neoplasm of breast: Secondary | ICD-10-CM

## 2016-12-11 ENCOUNTER — Encounter: Payer: Self-pay | Admitting: Interventional Cardiology

## 2016-12-17 ENCOUNTER — Ambulatory Visit: Payer: Self-pay | Admitting: Interventional Cardiology

## 2016-12-18 ENCOUNTER — Ambulatory Visit
Admission: RE | Admit: 2016-12-18 | Discharge: 2016-12-18 | Disposition: A | Discharge status: Home / Self Care | Payer: Medicare Other | Source: Ambulatory Visit | Attending: Internal Medicine | Admitting: Internal Medicine

## 2016-12-18 DIAGNOSIS — K5900 Constipation, unspecified: Secondary | ICD-10-CM | POA: Diagnosis not present

## 2016-12-18 DIAGNOSIS — R1013 Epigastric pain: Secondary | ICD-10-CM

## 2016-12-18 DIAGNOSIS — Z1231 Encounter for screening mammogram for malignant neoplasm of breast: Secondary | ICD-10-CM

## 2016-12-18 MED ORDER — IOPAMIDOL (ISOVUE-300) INJECTION 61%
100.0000 mL | Freq: Once | INTRAVENOUS | Status: AC | PRN
  Administered 2016-12-18: 100 mL via INTRAVENOUS

## 2016-12-20 DIAGNOSIS — E7800 Pure hypercholesterolemia, unspecified: Secondary | ICD-10-CM | POA: Diagnosis not present

## 2016-12-20 DIAGNOSIS — I10 Essential (primary) hypertension: Secondary | ICD-10-CM | POA: Diagnosis not present

## 2016-12-20 DIAGNOSIS — K2970 Gastritis, unspecified, without bleeding: Secondary | ICD-10-CM | POA: Diagnosis not present

## 2016-12-20 DIAGNOSIS — I351 Nonrheumatic aortic (valve) insufficiency: Secondary | ICD-10-CM | POA: Diagnosis not present

## 2016-12-24 DIAGNOSIS — K2950 Unspecified chronic gastritis without bleeding: Secondary | ICD-10-CM | POA: Diagnosis not present

## 2016-12-24 DIAGNOSIS — R1012 Left upper quadrant pain: Secondary | ICD-10-CM | POA: Diagnosis not present

## 2016-12-24 DIAGNOSIS — R1013 Epigastric pain: Secondary | ICD-10-CM | POA: Diagnosis not present

## 2016-12-24 DIAGNOSIS — K219 Gastro-esophageal reflux disease without esophagitis: Secondary | ICD-10-CM | POA: Diagnosis not present

## 2016-12-24 DIAGNOSIS — R1032 Left lower quadrant pain: Secondary | ICD-10-CM | POA: Diagnosis not present

## 2016-12-26 NOTE — Progress Notes (Signed)
Cardiology Office Note   Date:  12/27/2016   ID:  Danielle Richard, DOB [birth year], MRN [id number]  PCP:  Pearson GrippeKim, James, MD    No chief complaint on file. chest pain   Wt Readings from Last 3 Encounters:  12/27/16 108 lb 12.8 oz (49.4 kg)  07/11/16 109 lb 12.8 oz (49.8 kg)  11/09/15 111 lb 3.2 oz (50.4 kg)       History of Present Illness: Danielle Richard is a 66 y.o. female who is being seen today for the evaluation of chest pain at the request of Pearson GrippeKim, James, MD.  Looking at prior notes in the medical chart, she has had chronic chest pain documented for the past 6-7 years, as of 12/17.  It was thought to Octa MSK in nature.  She has some pain in the left side of the chest.  It occurs at random times.  It is not related to exertion.  The current pain is the same pain, but more intense than before.  SHe has some difficulty breathing.  No sweating.  CP seems constant at this point.  No syncope.  Occasionaly dizziness.    She only walks in the house.  She does not go out much.  No regular exercise.    No smoking.  Parents had HTN.    She had a stress test with Dr. Donnie Ahoilley in 2004 that was negative.   Echo in 2014 was reviewed and unremarkable:   - Left ventricle: The cavity size was normal. Wall thickness was increased in a pattern of mild LVH. The estimated ejection fraction was 60%. Wall motion was normal; there were no regional wall motion abnormalities. - Aortic valve: There are three cusps. 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 No significant mitral valve pathology noted.    Past Medical History:  Diagnosis Date  . Aortic insufficiency   . Depression   . GERD (gastroesophageal reflux disease)   . HLD (hyperlipidemia)   . Hypertension   . Insomnia   . Personal history of colonic polyps     Past Surgical History:  Procedure Laterality Date  . COLONOSCOPY WITH PROPOFOL N/A 09/29/2013   Procedure: COLONOSCOPY WITH PROPOFOL;  Surgeon: Charolett BumpersMartin K Johnson, MD;  Location: [facility]  END[facility];  Service: Endoscopy;  Laterality: N/A;  . ESOPHAGOGASTRODUODENOSCOPY (EGD) WITH PROPOFOL N/A 09/29/2013   Procedure: ESOPHAGOGASTRODUODENOSCOPY (EGD) WITH PROPOFOL;  Surgeon: Charolett BumpersMartin K Johnson, MD;  Location: [facility];  Service: Endoscopy;  Laterality: N/A;  . NO PAST SURGERIES       Current Outpatient Prescriptions  Medication Sig Dispense Refill  . albuterol (PROVENTIL HFA;VENTOLIN HFA) 108 (90 Base) MCG/ACT inhaler Inhale 2 puffs into the lungs every 4 (four) hours as needed for wheezing or shortness of breath. 1 Inhaler 0  . atorvastatin (LIPITOR) 40 MG tablet Take 1 tablet (40 mg total) by mouth daily. 30 tablet 5  . Blood Glucose Monitoring Suppl (TRUE METRIX METER) DEVI 1 each by Does not apply route 3 (three) times daily before meals. 1 Device 0  . calcium-vitamin D (OSCAL WITH D) 500-200 MG-UNIT tablet Take 1 tablet by mouth daily with breakfast. 60 tablet 3  . cetirizine (ZYRTEC) 10 MG tablet TAKE 1 TABLET (10 MG TOTAL) BY MOUTH DAILY. 30 tablet 3  . citalopram (CELEXA) 20 MG tablet Take 1 tablet (20 mg total) by mouth every morning. 60 tablet 3  . gabapentin (NEURONTIN) 300 MG capsule Take 1 capsule (300 mg total) by mouth 3 (three) times daily. 90  capsule 3  . glucose blood (TRUE METRIX BLOOD GLUCOSE TEST) test strip Use 3 times daily before meals 60 each 12  . hydrocortisone cream 0.5 % Apply 1 application topically 2 (two) times daily. 30 g 2  . metFORMIN (GLUCOPHAGE) 500 MG tablet TAKE 1 TABLET (500 MG TOTAL) BY MOUTH 2 TIMES DAILY WITH A MEAL. 60 tablet 5  . methocarbamol (ROBAXIN) 500 MG tablet Take 500 mg by mouth at bedtime. FOR MUSCLE SPASMS  1  . omega-3 acid ethyl esters (LOVAZA) 1 g capsule Take 1 capsule (1 g total) by mouth daily. 90 capsule 3  . pantoprazole (PROTONIX) 40 MG tablet Take 40 mg by mouth 2 (two) times daily.  6  . traZODone (DESYREL) 100 MG tablet Take 1 tablet (100 mg total) by mouth at bedtime. 30 tablet 5  .  triamterene-hydrochlorothiazide (MAXZIDE-25) 37.5-25 MG tablet Take 1 tablet by mouth daily. 30 tablet 5  . TRUEPLUS LANCETS 28G MISC 1 each by Does not apply route 3 (three) times daily before meals. 100 each 12   No current facility-administered medications for this visit.     Allergies:   Tylenol [acetaminophen] and Aspirin    Social History:  The patient  reports that she has never smoked. She has never used smokeless tobacco. She reports that she drinks about 1.2 oz of alcohol per week . She reports that she does not use drugs.   Family History:  The patient's family history includes Breast cancer (age of onset: 10) in her sister; CAD in her mother.    ROS:  Please see the history of present illness.   Otherwise, review of systems are positive for chest pain and some ankle soreness.   All other systems are reviewed and negative.    PHYSICAL EXAM: VS:  BP 110/64 (BP Location: Right Arm, Patient Position: Sitting, Cuff Size: Normal)   Pulse (!) 54   Ht 5\' 2"  (1.575 m)   Wt 108 lb 12.8 oz (49.4 kg)   SpO2 97%   BMI 19.90 kg/m  , BMI Body mass index is 19.9 kg/m. GEN: Well nourished, well developed, in no acute distress  HEENT: normal  Neck: no JVD, carotid bruits, or masses Cardiac: RRR; no murmurs, rubs, or gallops,no edema  Respiratory:  clear to auscultation bilaterally, normal work of breathing GI: soft, nontender, nondistended, + BS MS: no deformity or atrophy  Skin: warm and dry, no rash Neuro:  Strength and sensation are intact Psych: euthymic mood, full affect   EKG:   The ekg ordered today demonstrates NSR, no ST changes   Recent Labs: 07/11/2016: ALT 14; BUN 19; Creat 0.98; Potassium 4.2; Sodium 143   Lipid Panel    Component Value Date/Time   CHOL 133 07/11/2016 0942   TRIG 133 07/11/2016 0942   HDL 51 07/11/2016 0942   CHOLHDL 2.6 07/11/2016 0942   VLDL 27 07/11/2016 0942   LDLCALC 55 07/11/2016 0942     Other studies Reviewed: Additional studies/  records that were reviewed today with results demonstrating: stress and echo as above.   ASSESSMENT AND PLAN:  1. Chest pain: Several atypical features. Pain is essentially constant. Not related to exertion. She does have risk factors for CAD. We'll plan for exercise treadmill test since it has been several years since she was evaluated for ischemia. This symptom has been a chronic problem over the past several years.  2. Hyperlipidemia: Lipids well controlled. Continue atorvastatin. 3. Diabetes: Continue diabetes meds. 4. Hypertension: Continue  blood pressure lowering drugs. 5. Fatigue/DOE: Likely deconditioning.  Eval for ischemia.  Encourage regular exercise as noted below.  Longterm, this will Rhyanna her goal.    Current medicines are reviewed at length with the patient today.  The patient concerns regarding her medicines were addressed.  The following changes have been made:  No change  Labs/ tests ordered today include:  No orders of the defined types were placed in this encounter.   Recommend 150 minutes/week of aerobic exercise Low fat, low carb, high fiber diet recommended  Disposition:   FU for stress test   Signed, Lance Muss, MD  12/27/2016 10:55 AM    [facility] [address] [place], [place], [place]  [address] Phone: [phone]; Fax: [phone]

## 2016-12-27 ENCOUNTER — Ambulatory Visit (INDEPENDENT_AMBULATORY_CARE_PROVIDER_SITE_OTHER): Payer: Medicare Other | Admitting: Interventional Cardiology

## 2016-12-27 ENCOUNTER — Encounter: Payer: Self-pay | Admitting: Interventional Cardiology

## 2016-12-27 ENCOUNTER — Encounter (INDEPENDENT_AMBULATORY_CARE_PROVIDER_SITE_OTHER): Payer: Self-pay

## 2016-12-27 VITALS — BP 110/64 | HR 54 | Ht 62.0 in | Wt 108.8 lb

## 2016-12-27 DIAGNOSIS — E782 Mixed hyperlipidemia: Secondary | ICD-10-CM | POA: Diagnosis not present

## 2016-12-27 DIAGNOSIS — I10 Essential (primary) hypertension: Secondary | ICD-10-CM | POA: Diagnosis not present

## 2016-12-27 DIAGNOSIS — R079 Chest pain, unspecified: Secondary | ICD-10-CM | POA: Diagnosis not present

## 2016-12-27 NOTE — Patient Instructions (Signed)
Medication Instructions:  Your physician recommends that you continue on your current medications as directed. Please refer to the Current Medication list given to you today.   Labwork: None ordered  Testing/Procedures: Your physician has requested that you have an exercise tolerance test. For further information please visit [email or web address]. Please also follow instruction sheet, as given.    Follow-Up: Based on results of exercise treadmill test.  Any Other Special Instructions Will Auda Listed Below (If Applicable).     If you need a refill on your cardiac medications before your next appointment, please call your pharmacy.  

## 2017-01-02 DIAGNOSIS — K259 Gastric ulcer, unspecified as acute or chronic, without hemorrhage or perforation: Secondary | ICD-10-CM | POA: Diagnosis not present

## 2017-01-02 DIAGNOSIS — R1012 Left upper quadrant pain: Secondary | ICD-10-CM | POA: Diagnosis not present

## 2017-01-02 DIAGNOSIS — K2930 Chronic superficial gastritis without bleeding: Secondary | ICD-10-CM | POA: Diagnosis not present

## 2017-01-02 DIAGNOSIS — K317 Polyp of stomach and duodenum: Secondary | ICD-10-CM | POA: Diagnosis not present

## 2017-01-02 DIAGNOSIS — R1013 Epigastric pain: Secondary | ICD-10-CM | POA: Diagnosis not present

## 2017-01-06 DIAGNOSIS — L309 Dermatitis, unspecified: Secondary | ICD-10-CM | POA: Diagnosis not present

## 2017-01-06 DIAGNOSIS — J069 Acute upper respiratory infection, unspecified: Secondary | ICD-10-CM | POA: Diagnosis not present

## 2017-01-06 DIAGNOSIS — R05 Cough: Secondary | ICD-10-CM | POA: Diagnosis not present

## 2017-01-08 DIAGNOSIS — K2970 Gastritis, unspecified, without bleeding: Secondary | ICD-10-CM | POA: Diagnosis not present

## 2017-01-08 DIAGNOSIS — T7840XD Allergy, unspecified, subsequent encounter: Secondary | ICD-10-CM | POA: Diagnosis not present

## 2017-01-09 DIAGNOSIS — K2930 Chronic superficial gastritis without bleeding: Secondary | ICD-10-CM | POA: Diagnosis not present

## 2017-01-09 DIAGNOSIS — K317 Polyp of stomach and duodenum: Secondary | ICD-10-CM | POA: Diagnosis not present

## 2017-01-15 DIAGNOSIS — R21 Rash and other nonspecific skin eruption: Secondary | ICD-10-CM | POA: Diagnosis not present

## 2017-01-29 ENCOUNTER — Ambulatory Visit (INDEPENDENT_AMBULATORY_CARE_PROVIDER_SITE_OTHER): Payer: Medicare Other

## 2017-01-29 DIAGNOSIS — R079 Chest pain, unspecified: Secondary | ICD-10-CM

## 2017-01-29 LAB — EXERCISE TOLERANCE TEST
Estimated workload: 8 METS
Exercise duration (min): 7 min
Exercise duration (sec): 14 s
MPHR: 155 {beats}/min
Peak HR: 134 {beats}/min
Percent HR: 86 %
RPE: 16
Rest HR: 60 {beats}/min

## 2017-01-31 DIAGNOSIS — R109 Unspecified abdominal pain: Secondary | ICD-10-CM | POA: Diagnosis not present

## 2017-01-31 DIAGNOSIS — K259 Gastric ulcer, unspecified as acute or chronic, without hemorrhage or perforation: Secondary | ICD-10-CM | POA: Diagnosis not present

## 2017-01-31 DIAGNOSIS — R1013 Epigastric pain: Secondary | ICD-10-CM | POA: Diagnosis not present

## 2017-01-31 DIAGNOSIS — K5900 Constipation, unspecified: Secondary | ICD-10-CM | POA: Diagnosis not present

## 2017-02-05 ENCOUNTER — Telehealth: Payer: Self-pay

## 2017-02-05 DIAGNOSIS — I351 Nonrheumatic aortic (valve) insufficiency: Secondary | ICD-10-CM

## 2017-02-05 NOTE — Telephone Encounter (Signed)
-----   Message from Corky CraftsJayadeep S Varanasi, MD sent at 02/04/2017 10:43 AM EDT ----- Negative stress test

## 2017-02-05 NOTE — Telephone Encounter (Signed)
Notes recorded by Daleen Bourrie, Maresha Anastos I, RN on 02/05/2017 at 9:16 AM EDT Called the patient with interpreter [phone] and the daughter answered the phone who speaks [place] (DPR on file). Daughter made aware of results and verbalizes understanding. Daughter states that her mother is still having some chest discomfort and wants to know if she should have another echocardiogram done. She states that it was done a long time ago and at that time she had a leaky aortic valve at that time. She wants to know if this should [place] reassessed. Made daughter aware that the information would Shamari forwarded to Dr. Eldridge DaceVaranasi for review and recommendation. Daughter verbalized understanding.

## 2017-02-05 NOTE — Telephone Encounter (Signed)
-----   Message from Corky CraftsJayadeep S Varanasi, MD sent at 02/05/2017  3:59 PM EDT ----- OK to check echo for aortic insufficiency.

## 2017-02-05 NOTE — Telephone Encounter (Signed)
Spoke with patient's daughter and informed her that the echocardiogram has been ordered to reassess her mother's AI. I made her aware that she would Miarose receiving a call to schedule. She verbalized understanding and thanked me for the call.

## 2017-02-12 DIAGNOSIS — M810 Age-related osteoporosis without current pathological fracture: Secondary | ICD-10-CM | POA: Diagnosis not present

## 2017-02-18 ENCOUNTER — Ambulatory Visit (HOSPITAL_COMMUNITY): Payer: Medicare Other | Attending: Cardiovascular Disease

## 2017-02-18 ENCOUNTER — Other Ambulatory Visit (HOSPITAL_COMMUNITY): Payer: Medicare Other

## 2017-02-18 ENCOUNTER — Other Ambulatory Visit: Payer: Self-pay

## 2017-02-18 DIAGNOSIS — I351 Nonrheumatic aortic (valve) insufficiency: Secondary | ICD-10-CM | POA: Insufficient documentation

## 2017-05-01 DIAGNOSIS — R1084 Generalized abdominal pain: Secondary | ICD-10-CM | POA: Diagnosis not present

## 2017-05-08 DIAGNOSIS — H2513 Age-related nuclear cataract, bilateral: Secondary | ICD-10-CM | POA: Diagnosis not present

## 2017-05-08 DIAGNOSIS — H401222 Low-tension glaucoma, left eye, moderate stage: Secondary | ICD-10-CM | POA: Diagnosis not present

## 2017-05-08 DIAGNOSIS — H401211 Low-tension glaucoma, right eye, mild stage: Secondary | ICD-10-CM | POA: Diagnosis not present

## 2017-05-08 DIAGNOSIS — H25013 Cortical age-related cataract, bilateral: Secondary | ICD-10-CM | POA: Diagnosis not present

## 2017-05-29 DIAGNOSIS — K259 Gastric ulcer, unspecified as acute or chronic, without hemorrhage or perforation: Secondary | ICD-10-CM | POA: Diagnosis not present

## 2017-05-29 DIAGNOSIS — Z23 Encounter for immunization: Secondary | ICD-10-CM | POA: Diagnosis not present

## 2017-05-29 DIAGNOSIS — R1084 Generalized abdominal pain: Secondary | ICD-10-CM | POA: Diagnosis not present

## 2017-05-29 DIAGNOSIS — K2950 Unspecified chronic gastritis without bleeding: Secondary | ICD-10-CM | POA: Diagnosis not present

## 2017-06-17 DIAGNOSIS — E119 Type 2 diabetes mellitus without complications: Secondary | ICD-10-CM | POA: Diagnosis not present

## 2017-06-17 DIAGNOSIS — E7800 Pure hypercholesterolemia, unspecified: Secondary | ICD-10-CM | POA: Diagnosis not present

## 2017-06-20 DIAGNOSIS — E119 Type 2 diabetes mellitus without complications: Secondary | ICD-10-CM | POA: Diagnosis not present

## 2017-06-20 DIAGNOSIS — R079 Chest pain, unspecified: Secondary | ICD-10-CM | POA: Diagnosis not present

## 2017-06-21 ENCOUNTER — Other Ambulatory Visit: Payer: Self-pay | Admitting: Internal Medicine

## 2017-06-21 ENCOUNTER — Telehealth: Payer: Self-pay | Admitting: *Deleted

## 2017-06-21 DIAGNOSIS — R079 Chest pain, unspecified: Secondary | ICD-10-CM

## 2017-06-21 DIAGNOSIS — M549 Dorsalgia, unspecified: Secondary | ICD-10-CM | POA: Diagnosis not present

## 2017-06-21 DIAGNOSIS — M546 Pain in thoracic spine: Secondary | ICD-10-CM | POA: Diagnosis not present

## 2017-06-21 DIAGNOSIS — G8929 Other chronic pain: Secondary | ICD-10-CM | POA: Diagnosis not present

## 2017-06-21 DIAGNOSIS — M545 Low back pain: Secondary | ICD-10-CM | POA: Diagnosis not present

## 2017-06-21 NOTE — Telephone Encounter (Signed)
NOTES SENT TO SCHEDULING.  °

## 2017-06-25 ENCOUNTER — Encounter (HOSPITAL_COMMUNITY): Payer: Medicare Other

## 2017-08-21 ENCOUNTER — Telehealth (HOSPITAL_COMMUNITY): Payer: Self-pay | Admitting: *Deleted

## 2017-08-21 NOTE — Telephone Encounter (Signed)
Patient's daughter, DPR,, given detailed instructions per Myocardial Perfusion Study Information Sheet for the test on 08/26/17. Patient notified to arrive 15 minutes early and that it is imperative to arrive on time for appointment to keep from having the test rescheduled.  If you need to cancel or reschedule your appointment, please call the office within 24 hours of your appointment. . Patient verbalized understanding. Ricky AlaSmith, Bentley Fissel Jacqueline

## 2017-08-22 DIAGNOSIS — M810 Age-related osteoporosis without current pathological fracture: Secondary | ICD-10-CM | POA: Diagnosis not present

## 2017-08-26 ENCOUNTER — Encounter (HOSPITAL_COMMUNITY): Payer: Medicare Other

## 2017-09-04 DIAGNOSIS — E785 Hyperlipidemia, unspecified: Secondary | ICD-10-CM | POA: Diagnosis not present

## 2017-09-04 DIAGNOSIS — I10 Essential (primary) hypertension: Secondary | ICD-10-CM | POA: Diagnosis not present

## 2017-10-04 ENCOUNTER — Other Ambulatory Visit (HOSPITAL_COMMUNITY): Payer: Self-pay | Admitting: Respiratory Therapy

## 2017-10-04 ENCOUNTER — Other Ambulatory Visit: Payer: Self-pay | Admitting: Internal Medicine

## 2017-10-04 DIAGNOSIS — R05 Cough: Secondary | ICD-10-CM

## 2017-10-04 DIAGNOSIS — R079 Chest pain, unspecified: Secondary | ICD-10-CM | POA: Diagnosis not present

## 2017-10-04 DIAGNOSIS — R059 Cough, unspecified: Secondary | ICD-10-CM

## 2017-10-04 DIAGNOSIS — R0600 Dyspnea, unspecified: Secondary | ICD-10-CM

## 2017-10-07 ENCOUNTER — Telehealth (HOSPITAL_COMMUNITY): Payer: Self-pay | Admitting: *Deleted

## 2017-10-07 NOTE — Telephone Encounter (Signed)
Left a message for the interpreter services to assign a [name] interpreter for this patient on 10/09/17.Elleana Stillson, Adelene IdlerCynthia W

## 2017-10-07 NOTE — Telephone Encounter (Signed)
.  Patient given detailed instructions per Myocardial Perfusion Study Information Sheet for the test on 10/09/17 at 0945. Patient notified to arrive 15 minutes early and that it is imperative to arrive on time for appointment to keep from having the test rescheduled.  If you need to cancel or reschedule your appointment, please call the office within 24 hours of your appointment. . Patient verbalized understanding.Lagina Reader, Adelene IdlerCynthia W

## 2017-10-09 ENCOUNTER — Encounter (INDEPENDENT_AMBULATORY_CARE_PROVIDER_SITE_OTHER): Payer: Self-pay

## 2017-10-09 ENCOUNTER — Ambulatory Visit (HOSPITAL_COMMUNITY): Payer: Medicare Other | Attending: Internal Medicine

## 2017-10-09 DIAGNOSIS — R079 Chest pain, unspecified: Secondary | ICD-10-CM | POA: Insufficient documentation

## 2017-10-09 MED ORDER — TECHNETIUM TC 99M TETROFOSMIN IV KIT
31.3000 | PACK | Freq: Once | INTRAVENOUS | Status: AC | PRN
  Administered 2017-10-09: 31.3 via INTRAVENOUS
  Filled 2017-10-09: qty 32

## 2017-10-15 ENCOUNTER — Ambulatory Visit (HOSPITAL_COMMUNITY): Payer: Medicare Other

## 2017-10-15 DIAGNOSIS — R079 Chest pain, unspecified: Secondary | ICD-10-CM | POA: Insufficient documentation

## 2017-10-15 DIAGNOSIS — Z8719 Personal history of other diseases of the digestive system: Secondary | ICD-10-CM | POA: Diagnosis not present

## 2017-10-15 DIAGNOSIS — R1012 Left upper quadrant pain: Secondary | ICD-10-CM | POA: Diagnosis not present

## 2017-10-15 LAB — MYOCARDIAL PERFUSION IMAGING
LV dias vol: 70 mL (ref 46–106)
LV sys vol: 22 mL
Peak HR: 90 {beats}/min
RATE: 0.29
Rest HR: 62 {beats}/min
SDS: 0
SRS: 2
SSS: 0
TID: 0.86

## 2017-10-15 MED ORDER — REGADENOSON 0.4 MG/5ML IV SOLN
0.4000 mg | Freq: Once | INTRAVENOUS | Status: AC
  Administered 2017-10-15: 0.4 mg via INTRAVENOUS

## 2017-10-15 MED ORDER — TECHNETIUM TC 99M TETROFOSMIN IV KIT
31.6000 | PACK | Freq: Once | INTRAVENOUS | Status: AC | PRN
  Administered 2017-10-15: 31.6 via INTRAVENOUS
  Filled 2017-10-15: qty 32

## 2017-10-15 NOTE — Progress Notes (Unsigned)
[facility] Interpreter present for procedure-Snow [place].

## 2017-10-16 ENCOUNTER — Ambulatory Visit (HOSPITAL_COMMUNITY)
Admission: RE | Admit: 2017-10-16 | Discharge: 2017-10-16 | Disposition: A | Discharge status: Home / Self Care | Payer: Medicare Other | Source: Ambulatory Visit | Attending: Internal Medicine | Admitting: Internal Medicine

## 2017-10-16 DIAGNOSIS — R05 Cough: Secondary | ICD-10-CM | POA: Insufficient documentation

## 2017-10-16 DIAGNOSIS — R0600 Dyspnea, unspecified: Secondary | ICD-10-CM | POA: Insufficient documentation

## 2017-10-16 DIAGNOSIS — Z9289 Personal history of other medical treatment: Secondary | ICD-10-CM

## 2017-10-16 DIAGNOSIS — R059 Cough, unspecified: Secondary | ICD-10-CM

## 2017-10-16 HISTORY — DX: Personal history of other medical treatment: Z92.89

## 2017-10-16 LAB — PULMONARY FUNCTION TEST
DL/VA % pred: 115 %
DL/VA: 5.1 ml/min/mmHg/L
DLCO unc % pred: 88 %
DLCO unc: 17.87 ml/min/mmHg
FEF 25-75 Pre: 3.17 L/sec
FEF2575-%Pred-Pre: 164 %
FEV1-%Pred-Pre: 102 %
FEV1-Pre: 2.15 L
FEV1FVC-%Pred-Pre: 114 %
FEV6-%Pred-Pre: 91 %
FEV6-Pre: 2.44 L
FEV6FVC-%Pred-Pre: 104 %
FVC-%Pred-Pre: 88 %
FVC-Pre: 2.44 L
Pre FEV1/FVC ratio: 88 %
Pre FEV6/FVC Ratio: 100 %
RV % pred: 176 %
RV: 3.45 L
TLC % pred: 123 %
TLC: 5.67 L

## 2017-11-07 DIAGNOSIS — R079 Chest pain, unspecified: Secondary | ICD-10-CM | POA: Diagnosis not present

## 2017-11-07 DIAGNOSIS — R05 Cough: Secondary | ICD-10-CM | POA: Diagnosis not present

## 2017-11-07 DIAGNOSIS — E785 Hyperlipidemia, unspecified: Secondary | ICD-10-CM | POA: Diagnosis not present

## 2017-11-07 DIAGNOSIS — I10 Essential (primary) hypertension: Secondary | ICD-10-CM | POA: Diagnosis not present

## 2017-11-13 DIAGNOSIS — H401222 Low-tension glaucoma, left eye, moderate stage: Secondary | ICD-10-CM | POA: Diagnosis not present

## 2017-11-13 DIAGNOSIS — H401211 Low-tension glaucoma, right eye, mild stage: Secondary | ICD-10-CM | POA: Diagnosis not present

## 2017-11-26 DIAGNOSIS — K317 Polyp of stomach and duodenum: Secondary | ICD-10-CM | POA: Diagnosis not present

## 2017-11-26 DIAGNOSIS — K3189 Other diseases of stomach and duodenum: Secondary | ICD-10-CM | POA: Diagnosis not present

## 2017-11-26 DIAGNOSIS — K210 Gastro-esophageal reflux disease with esophagitis: Secondary | ICD-10-CM | POA: Diagnosis not present

## 2017-11-26 DIAGNOSIS — R1012 Left upper quadrant pain: Secondary | ICD-10-CM | POA: Diagnosis not present

## 2017-11-28 DIAGNOSIS — K3189 Other diseases of stomach and duodenum: Secondary | ICD-10-CM | POA: Diagnosis not present

## 2017-11-28 DIAGNOSIS — K210 Gastro-esophageal reflux disease with esophagitis: Secondary | ICD-10-CM | POA: Diagnosis not present

## 2017-11-28 DIAGNOSIS — K317 Polyp of stomach and duodenum: Secondary | ICD-10-CM | POA: Diagnosis not present

## 2018-01-03 ENCOUNTER — Other Ambulatory Visit: Payer: Self-pay | Admitting: Internal Medicine

## 2018-01-03 DIAGNOSIS — Z1231 Encounter for screening mammogram for malignant neoplasm of breast: Secondary | ICD-10-CM

## 2018-01-09 DIAGNOSIS — E119 Type 2 diabetes mellitus without complications: Secondary | ICD-10-CM | POA: Diagnosis not present

## 2018-01-09 DIAGNOSIS — E785 Hyperlipidemia, unspecified: Secondary | ICD-10-CM | POA: Diagnosis not present

## 2018-01-09 DIAGNOSIS — I10 Essential (primary) hypertension: Secondary | ICD-10-CM | POA: Diagnosis not present

## 2018-01-15 DIAGNOSIS — R1010 Upper abdominal pain, unspecified: Secondary | ICD-10-CM | POA: Diagnosis not present

## 2018-01-16 ENCOUNTER — Other Ambulatory Visit: Payer: Self-pay | Admitting: Gastroenterology

## 2018-01-16 DIAGNOSIS — R1010 Upper abdominal pain, unspecified: Secondary | ICD-10-CM

## 2018-01-16 DIAGNOSIS — E785 Hyperlipidemia, unspecified: Secondary | ICD-10-CM | POA: Diagnosis not present

## 2018-01-16 DIAGNOSIS — E119 Type 2 diabetes mellitus without complications: Secondary | ICD-10-CM | POA: Diagnosis not present

## 2018-01-16 DIAGNOSIS — K279 Peptic ulcer, site unspecified, unspecified as acute or chronic, without hemorrhage or perforation: Secondary | ICD-10-CM | POA: Diagnosis not present

## 2018-01-16 DIAGNOSIS — I10 Essential (primary) hypertension: Secondary | ICD-10-CM | POA: Diagnosis not present

## 2018-01-16 DIAGNOSIS — R42 Dizziness and giddiness: Secondary | ICD-10-CM | POA: Diagnosis not present

## 2018-01-30 ENCOUNTER — Ambulatory Visit
Admission: RE | Admit: 2018-01-30 | Discharge: 2018-01-30 | Disposition: A | Discharge status: Home / Self Care | Payer: Medicare Other | Source: Ambulatory Visit | Attending: Gastroenterology | Admitting: Gastroenterology

## 2018-01-30 DIAGNOSIS — R1010 Upper abdominal pain, unspecified: Secondary | ICD-10-CM

## 2018-02-03 DIAGNOSIS — E785 Hyperlipidemia, unspecified: Secondary | ICD-10-CM | POA: Diagnosis not present

## 2018-02-03 DIAGNOSIS — I10 Essential (primary) hypertension: Secondary | ICD-10-CM | POA: Diagnosis not present

## 2018-02-03 DIAGNOSIS — R42 Dizziness and giddiness: Secondary | ICD-10-CM | POA: Diagnosis not present

## 2018-02-04 ENCOUNTER — Other Ambulatory Visit (HOSPITAL_COMMUNITY): Payer: Self-pay | Admitting: Gastroenterology

## 2018-02-04 DIAGNOSIS — R1010 Upper abdominal pain, unspecified: Secondary | ICD-10-CM

## 2018-02-05 ENCOUNTER — Ambulatory Visit
Admission: RE | Admit: 2018-02-05 | Discharge: 2018-02-05 | Disposition: A | Discharge status: Home / Self Care | Payer: Medicare Other | Source: Ambulatory Visit | Attending: Internal Medicine | Admitting: Internal Medicine

## 2018-02-05 DIAGNOSIS — Z1231 Encounter for screening mammogram for malignant neoplasm of breast: Secondary | ICD-10-CM

## 2018-02-10 ENCOUNTER — Ambulatory Visit (HOSPITAL_COMMUNITY)
Admission: RE | Admit: 2018-02-10 | Discharge: 2018-02-10 | Disposition: A | Discharge status: Home / Self Care | Payer: Medicare Other | Source: Ambulatory Visit | Attending: Gastroenterology | Admitting: Gastroenterology

## 2018-02-10 DIAGNOSIS — R1010 Upper abdominal pain, unspecified: Secondary | ICD-10-CM | POA: Diagnosis not present

## 2018-02-10 MED ORDER — TECHNETIUM TC 99M MEBROFENIN IV KIT
5.2000 | PACK | Freq: Once | INTRAVENOUS | Status: AC | PRN
  Administered 2018-02-10: 5.2 via INTRAVENOUS

## 2018-02-11 NOTE — Progress Notes (Deleted)
Cardiology Office Note    Date:  02/11/2018   ID:  Danielle Richard, DOB Dec 14, 1950, MRN [id number]  PCP:  Pearson GrippeKim, James, MD  Cardiologist: No primary care provider on file.  No chief complaint on file.   History of Present Illness:  Danielle Richard is a 67 y.o. female with history of chronic chest pain felt to Monic musculoskeletal in nature in the past negative stress test in 2004, echo 2014 unremarkable.  Last saw Dr. Eldridge DaceVaranasi 12/27/2016 complaining of constant chest pain nonexertional.  He ordered a GXT.  Lipids were controlled on atorvastatin, also has diabetes, hypertension and chronic fatigue.  2D echo 02/2017 normal LV function with grade 2 DD mild to moderate AI.  Nuclear stress test 10/2017 normal, no ischemia LVEF 68%.    Past Medical History:  Diagnosis Date  . Aortic insufficiency   . Depression   . GERD (gastroesophageal reflux disease)   . HLD (hyperlipidemia)   . Hypertension   . Insomnia   . Personal history of colonic polyps     Past Surgical History:  Procedure Laterality Date  . COLONOSCOPY WITH PROPOFOL N/A 09/29/2013   Procedure: COLONOSCOPY WITH PROPOFOL;  Surgeon: Charolett BumpersMartin K Johnson, MD;  Location: [facility];  Service: Endoscopy;  Laterality: N/A;  . ESOPHAGOGASTRODUODENOSCOPY (EGD) WITH PROPOFOL N/A 09/29/2013   Procedure: ESOPHAGOGASTRODUODENOSCOPY (EGD) WITH PROPOFOL;  Surgeon: Charolett BumpersMartin K Johnson, MD;  Location: [facility];  Service: Endoscopy;  Laterality: N/A;  . NO PAST SURGERIES      Current Medications: No outpatient medications have been marked as taking for the 02/12/18 encounter (Appointment) with Dyann KiefLenze, Gilad Dugger M, PA-C.     Allergies:   Tylenol [acetaminophen] and Aspirin   Social History   Socioeconomic History  . Marital status: Married    Spouse name: Not on file  . Number of children: 3  . Years of education: Not on file  . Highest education level: Not on file  Occupational History  . Not on file  Social Needs  . Financial resource strain:  Not on file  . Food insecurity:    Worry: Not on file    Inability: Not on file  . Transportation needs:    Medical: Not on file    Non-medical: Not on file  Tobacco Use  . Smoking status: Never Smoker  . Smokeless tobacco: Never Used  Substance and Sexual Activity  . Alcohol use: Yes    Alcohol/week: 1.2 oz    Types: 1 Glasses of wine, 1 Cans of beer per week    Comment: socially  . Drug use: No  . Sexual activity: Not on file  Lifestyle  . Physical activity:    Days per week: Not on file    Minutes per session: Not on file  . Stress: Not on file  Relationships  . Social connections:    Talks on phone: Not on file    Gets together: Not on file    Attends religious service: Not on file    Active member of club or organization: Not on file    Attends meetings of clubs or organizations: Not on file    Relationship status: Not on file  Other Topics Concern  . Not on file  Social History Narrative  . Not on file     Family History:  The patient's ***family history includes Breast cancer (age of onset: 947) in her sister; CAD in her mother.   ROS:   Please see the history of present  illness.    ROS All other systems reviewed and are negative.   PHYSICAL EXAM:   VS:  There were no vitals taken for this visit.  Physical Exam  GEN: Well nourished, well developed, in no acute distress  HEENT: normal  Neck: no JVD, carotid bruits, or masses Cardiac:RRR; no murmurs, rubs, or gallops  Respiratory:  clear to auscultation bilaterally, normal work of breathing GI: soft, nontender, nondistended, + BS Ext: without cyanosis, clubbing, or edema, Good distal pulses bilaterally MS: no deformity or atrophy  Skin: warm and dry, no rash Neuro:  Alert and Oriented x 3, Strength and sensation are intact Psych: euthymic mood, full affect  Wt Readings from Last 3 Encounters:  10/09/17 103 lb (46.7 kg)  12/27/16 108 lb 12.8 oz (49.4 kg)  07/11/16 109 lb 12.8 oz (49.8 kg)       Studies/Labs Reviewed:   EKG:  EKG is*** ordered today.  The ekg ordered today demonstrates ***  Recent Labs: No results found for requested labs within last 8760 hours.   Lipid Panel    Component Value Date/Time   CHOL 133 07/11/2016 0942   TRIG 133 07/11/2016 0942   HDL 51 07/11/2016 0942   CHOLHDL 2.6 07/11/2016 0942   VLDL 27 07/11/2016 0942   LDLCALC 55 07/11/2016 0942    Additional studies/ records that were reviewed today include:  Nuclear stress test 10/15/2017  Study Highlights    Nuclear stress EF: 68%. The left ventricular ejection fraction is hyperdynamic (>65%).  The study is normal.  This is a low risk study.  There is no evidence of ischemia. Normal left ventricular systolic function.`     2D echo 7/2018Study Conclusions   - Left ventricle: The cavity size was normal. Wall thickness was   normal. Systolic function was normal. The estimated ejection   fraction was in the range of 60% to 65%. Wall motion was normal;   there were no regional wall motion abnormalities. Features are   consistent with a pseudonormal left ventricular filling pattern,   with concomitant abnormal relaxation and increased filling   pressure (grade 2 diastolic dysfunction). - Aortic valve: There was mild to moderate regurgitation.   ASSESSMENT:    1. Chest pain, unspecified type   2. Essential hypertension   3. Aortic valve insufficiency, etiology of cardiac valve disease unspecified   4. Mixed hyperlipidemia      PLAN:  In order of problems listed above:  Chest pain atypical nuclear stress test 10/2017 normal without ischemia normal LVEF  Mild to moderate aortic insufficiency on echo 02/2017 grade 2 DD  Essential hypertension  Hyperlipidemia  Medication Adjustments/Labs and Tests Ordered: Current medicines are reviewed at length with the patient today.  Concerns regarding medicines are outlined above.  Medication changes, Labs and Tests ordered today are  listed in the Patient Instructions below. There are no Patient Instructions on file for this visit.   Signed, Jacolyn Reedy, PA-C  02/11/2018 3:45 PM    [facility] [address] [place], [place], [place]  [address] Phone: [phone]; Fax: [phone]

## 2018-02-12 ENCOUNTER — Telehealth: Payer: Self-pay | Admitting: Neurology

## 2018-02-12 ENCOUNTER — Encounter: Payer: Self-pay | Admitting: *Deleted

## 2018-02-12 ENCOUNTER — Ambulatory Visit (INDEPENDENT_AMBULATORY_CARE_PROVIDER_SITE_OTHER): Payer: Medicare Other | Admitting: Neurology

## 2018-02-12 ENCOUNTER — Encounter: Payer: Self-pay | Admitting: Neurology

## 2018-02-12 ENCOUNTER — Ambulatory Visit: Payer: Medicare Other | Admitting: Physician Assistant

## 2018-02-12 VITALS — Ht 61.25 in | Wt 108.0 lb

## 2018-02-12 DIAGNOSIS — R5382 Chronic fatigue, unspecified: Secondary | ICD-10-CM | POA: Diagnosis not present

## 2018-02-12 DIAGNOSIS — H8149 Vertigo of central origin, unspecified ear: Secondary | ICD-10-CM

## 2018-02-12 DIAGNOSIS — G8929 Other chronic pain: Secondary | ICD-10-CM

## 2018-02-12 DIAGNOSIS — R531 Weakness: Secondary | ICD-10-CM

## 2018-02-12 DIAGNOSIS — M545 Low back pain: Secondary | ICD-10-CM

## 2018-02-12 DIAGNOSIS — R42 Dizziness and giddiness: Secondary | ICD-10-CM | POA: Insufficient documentation

## 2018-02-12 DIAGNOSIS — R5383 Other fatigue: Secondary | ICD-10-CM

## 2018-02-12 DIAGNOSIS — H814 Vertigo of central origin: Secondary | ICD-10-CM

## 2018-02-12 NOTE — Progress Notes (Signed)
[facility]    Provider:  Dr Lucia GaskinsAhern Referring Provider: Pearson GrippeKim, James, MD Primary Care Physician:  Pearson GrippeKim, James, MD  CC:  lightheaded  HPI:  Danielle Richard is a 67 y.o. female here as a referral from Dr. Selena BattenKim for dizziness. She is here with an interpreter. PMHx chronic chest pain (thought to [facility] and evaluated by cardiology),  HTN, depression, insomnia, HLD.  Per our review of records patient has been complaining of dizziness since at least May 2018 over a year ago and today she states it has been ongoing 10 years ago in [place]. She is here with an interpreter. The dizziness has improved [place]. No hearing problems. No vision problems. She occasionally hears "bess in her right ear" but not often. She feels dizzy when she stands up. No focal weakness. The dizziness is not spinning feels lightheadedness (she reports no symptoms today when standing and during exam). Happens only in the morning when she wakes up. She feels fatigued. When she lays down in the bed she feels her head is heavy but no room spinning or dizziness when she goes to bed.No falls, the lightheadedness is every morning for 30 minutes, no syncope, no falls.  She has mild LBP without radicular symptoms or focal weakness.   Reviewed notes, labs and imaging from outside physicians, which showed:  hgba1c 5.8, BUN/Cr nml, stress test 2004 neg, echo 2014 unremarkable (reviewed reports)  She was evaluated by Cardiology in 2018 for chest pain and at that time also complained of occasional dizzines. Her fatigue/ DOE was likely deconditioning. Stress test 2018 negative. Repeat echo was unremarkable, PFTs normal.   Per review of records dizziness started 2 weeks ago, noticed while in the bathroom and initial event typically occurring in the a.m. when she wakes up.  Happening daily.  No other associated symptoms.  No focal neurologic deficits.  Review of Systems: Patient complains of symptoms per HPI  as well as the following symptoms: fatigue, joiint pain, dizziness. Pertinent negatives and positives per HPI. All others negative.   Social History   Socioeconomic History  . Marital status: Married    Spouse name: Not on file  . Number of children: 3  . Years of education: Not on file  . Highest education level: High school graduate  Occupational History  . Not on file  Social Needs  . Financial resource strain: Not on file  . Food insecurity:    Worry: Not on file    Inability: Not on file  . Transportation needs:    Medical: Not on file    Non-medical: Not on file  Tobacco Use  . Smoking status: Never Smoker  . Smokeless tobacco: Never Used  Substance and Sexual Activity  . Alcohol use: Not Currently    Alcohol/week: 1.2 oz    Types: 1 Glasses of wine, 1 Cans of beer per week    Comment: socially  . Drug use: No  . Sexual activity: Not on file  Lifestyle  . Physical activity:    Days per week: Not on file    Minutes per session: Not on file  . Stress: Not on file  Relationships  . Social connections:    Talks on phone: Not on file    Gets together: Not on file    Attends religious service: Not on file    Active member of club or organization: Not on file    Attends meetings of clubs or organizations:  Not on file    Relationship status: Not on file  . Intimate partner violence:    Fear of current or ex partner: Not on file    Emotionally abused: Not on file    Physically abused: Not on file    Forced sexual activity: Not on file  Other Topics Concern  . Not on file  Social History Narrative   Lives with husband and child   Right handed    Family History  Problem Relation Age of Onset  . CAD Mother   . Heart disease Father   . Cancer Father   . Breast cancer Sister 40  . High Cholesterol Brother   . Diabetes type II Brother     Past Medical History:  Diagnosis Date  . Aortic insufficiency    pt unaware of this  . Depression   . Diabetes  mellitus, type II (HCC)   . Dizziness    chronic for 10 years and mild  . GERD (gastroesophageal reflux disease)   . H/O mammogram 2016   normal  . History of nuclear stress test 10/16/2017   negative for ischemia  . HLD (hyperlipidemia)   . Hx of colonoscopy 2015   repeat 5 years  . Hypertension   . Insomnia   . Personal history of colonic polyps   . PUD (peptic ulcer disease)     Past Surgical History:  Procedure Laterality Date  . COLONOSCOPY WITH PROPOFOL N/A 09/29/2013   Procedure: COLONOSCOPY WITH PROPOFOL;  Surgeon: Charolett Bumpers, MD;  Location: [facility];  Service: Endoscopy;  Laterality: N/A;  . ESOPHAGOGASTRODUODENOSCOPY (EGD) WITH PROPOFOL N/A 09/29/2013   Procedure: ESOPHAGOGASTRODUODENOSCOPY (EGD) WITH PROPOFOL;  Surgeon: Charolett Bumpers, MD;  Location: [facility];  Service: Endoscopy;  Laterality: N/A;  . NO PAST SURGERIES      Current Outpatient Medications  Medication Sig Dispense Refill  . fexofenadine (ALLEGRA) 180 MG tablet Take 180 mg by mouth daily.    . Omega-3 Fatty Acids (FISH OIL) 1200 MG CAPS Take 1,200 mg by mouth daily.    Marland Kitchen atorvastatin (LIPITOR) 40 MG tablet Take 1 tablet (40 mg total) by mouth daily. 30 tablet 5  . Blood Glucose Monitoring Suppl (TRUE METRIX METER) DEVI 1 each by Does not apply route 3 (three) times daily before meals. 1 Device 0  . citalopram (CELEXA) 20 MG tablet Take 1 tablet (20 mg total) by mouth every morning. 60 tablet 3  . clotrimazole-betamethasone (LOTRISONE) cream APP EXT AA BID  0  . dicyclomine (BENTYL) 10 MG capsule Take 1 capsule by mouth 2 (two) times daily as needed.  12  . glucose blood (TRUE METRIX BLOOD GLUCOSE TEST) test strip Use 3 times daily before meals 60 each 12  . latanoprost (XALATAN) 0.005 % ophthalmic solution PLACE ONE DROP INTO BOTH EYES AT BEDTIME  4  . metFORMIN (GLUCOPHAGE) 500 MG tablet TAKE 1 TABLET (500 MG TOTAL) BY MOUTH 2 TIMES DAILY WITH A MEAL. 60 tablet 5  . omeprazole (PRILOSEC)  40 MG capsule Take 1 capsule by mouth 2 (two) times daily.  4  . PROLIA 60 MG/ML SOSY injection INJECT 60 MG Q 6 MONTHS  0  . sucralfate (CARAFATE) 1 g tablet Take 1 tablet by mouth. On an empty stomach three times daily before meals  4  . traZODone (DESYREL) 100 MG tablet Take 1 tablet (100 mg total) by mouth at bedtime. 30 tablet 5  . triamterene-hydrochlorothiazide (MAXZIDE-25) 37.5-25 MG tablet Take 1  tablet by mouth daily. 30 tablet 5  . TRUEPLUS LANCETS 28G MISC 1 each by Does not apply route 3 (three) times daily before meals. 100 each 12   No current facility-administered medications for this visit.     Allergies as of 02/12/2018 - Review Complete 02/12/2018  Allergen Reaction Noted  . Tylenol [acetaminophen]  09/29/2013  . Aspirin Rash 09/19/2012    Vitals: Ht 5' 1.25" (1.556 m)   Wt 108 lb (49 kg)   BMI 20.24 kg/m  Last Weight:  Wt Readings from Last 1 Encounters:  02/12/18 108 lb (49 kg)   Last Height:   Ht Readings from Last 1 Encounters:  02/12/18 5' 1.25" (1.556 m)   Physical exam: Exam: Gen: NAD             CV: RRR, no MRG. No Carotid Bruits. No peripheral edema, warm, nontender Eyes: Conjunctivae clear without exudates or hemorrhage  Neuro: Detailed Neurologic Exam  Speech:    Speech is normal; fluent and spontaneous with normal comprehension.  Cognition:    The patient is oriented to person, place, and time;     recent and remote memory intact;     language fluent;     normal attention, concentration,     fund of knowledge Cranial Nerves:    The pupils are equal, round, and reactive to light. Attempted fundoscopic exam could not visualize due to non-cooperation (language barrier). Visual fields are full to finger confrontation. Extraocular movements are intact. Trigeminal sensation is intact and the muscles of mastication are normal. The face is symmetric. The palate elevates in the midline. Hearing intact. Voice is normal. Shoulder shrug is normal. The  tongue has normal motion without fasciculations.   Coordination:    No dysmetria  Gait:    Normal native gait, balance intact  Motor Observation:    No asymmetry, no atrophy, and no involuntary movements noted. Tone:    Normal muscle tone.    Posture:    Posture is normal. normal erect    Strength:    Strength is symmetric and equal in the upper and lower limbs.      Sensation: intact to LT, romberg negative     Reflex Exam:  DTR's:    Deep tendon reflexes in the upper and lower extremities are symmetrical bilaterally.   Toes:    The toes are downgoing bilaterally.   Clonus:    Clonus is absent.       Assessment/Plan:  67 year old with vague symptoms of lightheadedness in the morning when she wakes for the last 10+ years.  Imprioved. Also chronic generalized fatigue and other non-focal symptoms. Exam normal.   - She has risk factors for cerebrovascular disease will check an MRI brain w/wo contrast for strokes, schwannomas, CN 8 lesions or any other etiology.   - She feels generally weak and fatigued, some mild low back pain exam is non focal: Needs physical therapy for strength and endurance therapy and exercises for low back pain  - If workup negative return to pcp  - orthostatics normal, not symptomatic today, discussed good hydration, exercise  Orders Placed This Encounter  Procedures  . MR BRAIN W WO CONTRAST  . Basic Metabolic Panel  . Ambulatory referral to Physical Therapy     Naomie Dean, MD  [facility] [address] [place], [place] [address]  Phone [phone] Fax [phone]

## 2018-02-12 NOTE — Telephone Encounter (Signed)
[employer] Medicare/medicaid order sent to [facility]. No auth and the will reach out to the pt to schedule.

## 2018-02-12 NOTE — Patient Instructions (Addendum)
MRI brain Physical therapy Lab today   Chng m?t Dizziness Chng m?t l m?t v?n ?? ph? bi?n. ? l c?m gic khng ?n ??nh ho?c chong vng. Qu v? c th? c?m th?y nh? s?p ng?t. Chng m?t c th? d?n ??n ch?n th??ng n?u qu v? v?p ho?c t ng. B?t k? ai ??u c th? b? chng m?t, nh?ng chng m?t ph? bi?n h?n ? ng??i tr??ng thnh l?n tu?i. Nguyn nhn c?a tnh tr?ng ny c th? do m?t s? y?u t? nh? thu?c, m?t n??c, ho?c b?nh l. Tun th? nh?ng h??ng d?n ny ? nh: ?n v u?ng  U?ng ?? n??c ?? gi? cho n??c ti?u trong ho?c c mu vng nh?t. U?ng ?? n??c gip qu v? trnh b? m?t n??c. C? g?ng u?ng nhi?u cc ?? l?ng trong h?n, ch?ng h?n nh? n??c.  Khng u?ng r??u.  H?n ch? l??ng dng caffeine n?u ???c chuyn gia ch?m [place] s?c kh?e ch? d?n nh? v?y. Ki?m tra thnh ph?n v thng tin dinh d??ng ?? xem th?c ph?m ho?c ?? u?ng c ch?a caffeine khng.  H?n ch? l??ng mu?i (natri) n?u chuyn gia ch?m [place] s?c kh?e ch? d?n nh? v?y. Ki?m tra thnh ph?n v thng tin dinh d??ng ?? xem th?c ph?m ho?c ?? u?ng c ch?a natri khng. Ho?t ??ng  Trnh cc v?n ??ng nhanh. ? ??ng d?y kh?i gh? th?t ch?m v ?n ??nh cho ??n khi qu v? c?m th?y khng c v?n ?? g. ? Vo bu?i sng, ??u tin l ng?i d?y trn mp gi??ng. Khi qu v? c?m th?y ?n, hy t? t? ??ng d?y trong lc bm vo v?t g ? cho ??n khi qu v? th?y th?ng b?ng.  N?u qu v? c?n ??ng ? m?t n?i trong m?t th?i gian di, hy c? ??ng chn th??ng xuyn. Si?t ch?t v th? gin cc c? chn trong lc ??ng.  Khng li xe ho?c v?n hnh my mc n?ng n?u qu v? c?m th?y chng m?t.  Trnh g?p ng??i xu?ng n?u qu v? c?m th?y chng m?t. ??t cc ?? v?t trong nh sao cho d? v?i t?i m khng c?n ph?i nhoi ng??i. L?i s?ng  Khng s? d?ng b?t k? s?n ph?m no ch?a nicotine ho?c thu?c l, ch?ng ha?n nh? thu?c l d?ng ht v thu?c l ?i?n t?. N?u qu v? c?n gip ?? ?? cai thu?c, hy h?i chuyn gia ch?m [place] s?c kh?e.  C? g?ng gi?m m?c ?? c?ng th?ng b?ng cch s? d?ng cc ph??ng php nh?  yoga ho?c thi?n. Trao ??i v?i chuyn gia ch?m [place] s?c kh?e n?u qu v? c?n ???c tr? gip ?? qu?n l c?ng th?ng. H??ng d?n chung  Theo di tnh tr?ng chng m?t c?a mnh xem c b?t c? thay ??i no khng.  Ch? s? d?ng thu?c khng k ??n v thu?c k ??n theo ch? d?n c?a chuyn gia ch?m [place] s?c kh?e. Hy trao ??i v?i chuyn gia ch?m [place] s?c kh?e n?u qu v? ngh? r?ng chng m?t l do thu?c qu v? ?ang dng gy ra.  Hy ni cho b?n b ho?c thnh vin gia ?nh bi?t r?ng qu v? ?ang c?m th?y chng m?t. N?u ng??i ny th?y c b?t c? thay ??i no trong hnh vi c?a qu v?, hy nh? ng??i ny g?i cho chuyn gia ch?m [place] s?c kh?e c?a qu v?.  Tun th? t?t c? cc l?n khm theo di theo ch? d?n c?a chuyn gia ch?m [place] s?c kh?e. ?i?u ny c vai tr quan tr?ng. Hy  lin l?c v?i chuyn gia ch?m [place] s?c kh?e n?u:  Tnh tr?ng chng m?t c?a qu v? khng h?t.  Qu v? b? hoa m?t ho?c chng m?t tr?m tr?ng h?n.  Qu v? c?m th?y bu?n nn.  Thnh l?c c?a qu v? gi?m.  Qu v? c cc tri?u ch?ng m?i.  Qu v? ??ng khng v?ng ho?c c?m gic nh? c?n phng ?ang quay. Yu c?u tr? gip ngay l?p t?c n?u:  Qu v? nn ho?c b? tiu ch?y v khng th? ?n ho?c u?ng th? g.  Qu v? kh ni chuy?n, ?i b?, nu?t, ho?c kh s? d?ng cnh tay, bn tay ho?c chn.  Qu v? c?m th?y y?u ton thn.  Qu v? suy ngh? khng r rng ho?c kh ??t cu. M?t ng??i b?n ho?c ng??i trong gia ?nh c th? nh?n th?y ?i?u ny.  Qu v? b? ?au ng?c, ?au b?ng, kh th? ho?c v m? hi.  Th? l?c c?a qu v? thay ??i.  Qu v? b? ch?y mu.  Qu v? b? ?au ??u d? d?i.  Qu v? b? ?au c? ho?c c?ng c?.  Qu v? b? s?t. Nh?ng tri?u ch?ng ny c th? l bi?u hi?n c?a m?t v?n ?? nghim tr?ng c?n c?p c?u. Khng ch? xem tri?u ch?ng c h?t khng. Hy ?i khm ngay l?p t?c. G?i cho d?ch v? c?p c?u t?i ??a ph??ng (911 ? [place] Khng t? li xe ??n b?nh vi?n. Tm t?t  Chng m?t l c?m gic khng ?n ??nh ho?c chong vng. Nguyn nhn c?a tnh tr?ng ny c th? do m?t s?  y?u t? nh? thu?c, m?t n??c, ho?c b?nh l.  B?t k? ai ??u c th? b? chng m?t, nh?ng chng m?t ph? bi?n h?n ? ng??i tr??ng thnh l?n tu?i.  U?ng ?? n??c ?? gi? cho n??c ti?u trong ho?c c mu vng nh?t. Khng u?ng r??u.  Trnh nh?ng v?n ??ng nhanh n?u qu v? c?m th?y chng m?t. Theo di tnh tr?ng chng m?t c?a qu v? xem c thay ??i g khng. Thng tin ny khng nh?m m?c ?ch thay th? cho l?i khuyn m chuyn gia ch?m [place] s?c kh?e ni v?i qu v?. Hy b?o ??m qu v? ph?i th?o lu?n b?t k? v?n ?? g m qu v? c v?i chuyn gia ch?m [place] s?c kh?e c?a qu v?. Document Released: 07/12/2011 Document Revised: 11/16/2016 Document Reviewed: 11/16/2016 Elsevier Interactive Patient Education  2018 Elsevier Inc.   Dizziness Dizziness is a common problem. It makes you feel unsteady or light-headed. You may feel like you are about to pass out (faint). Dizziness can lead to getting hurt if you stumble or fall. Dizziness can [place] caused by many things, including:  Medicines.  Not having enough water in your body (dehydration).  Illness.  Follow these instructions at home: Eating and drinking  Drink enough fluid to keep your pee (urine) clear or pale yellow. This helps to keep you from getting dehydrated. Try to drink more clear fluids, such as water.  Do not drink alcohol.  Limit how much caffeine you drink or eat, if your doctor tells you to do that.  Limit how much salt (sodium) you drink or eat, if your doctor tells you to do that. Activity  Avoid making quick movements. ? When you stand up from sitting in a chair, steady yourself until you feel okay. ? In the morning, first sit up on the side of the bed. When you feel okay, stand slowly while you hold onto something.  Do this until you know that your balance is fine.  If you need to stand in one place for a long time, move your legs often. Tighten and relax the muscles in your legs while you are standing.  Do not drive or use heavy machinery  if you feel dizzy.  Avoid bending down if you feel dizzy. Place items in your home so you can reach them easily without leaning over. Lifestyle  Do not use any products that contain nicotine or tobacco, such as cigarettes and e-cigarettes. If you need help quitting, ask your doctor.  Try to lower your stress level. You can do this by using methods such as yoga or meditation. Talk with your doctor if you need help. General instructions  Watch your dizziness for any changes.  Take over-the-counter and prescription medicines only as told by your doctor. Talk with your doctor if you think that you are dizzy because of a medicine that you are taking.  Tell a friend or a family member that you are feeling dizzy. If he or she notices any changes in your behavior, have this person call your doctor.  Keep all follow-up visits as told by your doctor. This is important. Contact a doctor if:  Your dizziness does not go away.  Your dizziness or light-headedness gets worse.  You feel sick to your stomach (nauseous).  You have trouble hearing.  You have new symptoms.  You are unsteady on your feet.  You feel like the room is spinning. Get help right away if:  You throw up (vomit) or have watery poop (diarrhea), and you cannot eat or drink anything.  You have trouble: ? Talking. ? Walking. ? Swallowing. ? Using your arms, hands, or legs.  You feel generally weak.  You are not thinking clearly, or you have trouble forming sentences. A friend or family member may notice this.  You have: ? Chest pain. ? Pain in your belly (abdomen). ? Shortness of breath. ? Sweating.  Your vision changes.  You are bleeding.  You have a very bad headache.  You have neck pain or a stiff neck.  You have a fever. These symptoms may [place] an emergency. Do not wait to see if the symptoms will go away. Get medical help right away. Call your local emergency services (911 in the [place] Do not drive  yourself to the hospital. Summary  Dizziness makes you feel unsteady or light-headed. You may feel like you are about to pass out (faint).  Drink enough fluid to keep your pee (urine) clear or pale yellow. Do not drink alcohol.  Avoid making quick movements if you feel dizzy.  Watch your dizziness for any changes. This information is not intended to replace advice given to you by your health care provider. Make sure you discuss any questions you have with your health care provider. Document Released: 07/12/2011 Document Revised: 08/09/2016 Document Reviewed: 08/09/2016 Elsevier Interactive Patient Education  2017 [employer].

## 2018-02-13 ENCOUNTER — Telehealth: Payer: Self-pay | Admitting: Neurology

## 2018-02-13 LAB — BASIC METABOLIC PANEL
BUN/Creatinine Ratio: 10 — ABNORMAL LOW (ref 12–28)
BUN: 9 mg/dL (ref 8–27)
CO2: 28 mmol/L (ref 20–29)
Calcium: 9.5 mg/dL (ref 8.7–10.3)
Chloride: 98 mmol/L (ref 96–106)
Creatinine, Ser: 0.93 mg/dL (ref 0.57–1.00)
GFR calc Af Amer: 74 mL/min/{1.73_m2} (ref >59)
GFR calc non Af Amer: 64 mL/min/{1.73_m2} (ref >59)
Glucose: 128 mg/dL — ABNORMAL HIGH (ref 65–99)
Potassium: 4.2 mmol/L (ref 3.5–5.2)
Sodium: 141 mmol/L (ref 134–144)

## 2018-02-13 NOTE — Telephone Encounter (Signed)
-----   Message from Anson FretAntonia B Ahern, MD sent at 02/13/2018  9:57 AM EDT ----- Elevated glucose otherwise unremarkable thanks

## 2018-02-13 NOTE — Telephone Encounter (Signed)
Called and informed the patient and her husband that lab work looked good. No concerns. Her glucose level was slightly elevated but no concerns otherwise. Pt verbalized understanding.

## 2018-02-19 ENCOUNTER — Ambulatory Visit
Admission: RE | Admit: 2018-02-19 | Discharge: 2018-02-19 | Disposition: A | Discharge status: Home / Self Care | Payer: Medicare Other | Source: Ambulatory Visit | Attending: Neurology | Admitting: Neurology

## 2018-02-19 DIAGNOSIS — R531 Weakness: Secondary | ICD-10-CM

## 2018-02-19 DIAGNOSIS — R42 Dizziness and giddiness: Secondary | ICD-10-CM

## 2018-02-19 DIAGNOSIS — H814 Vertigo of central origin: Secondary | ICD-10-CM

## 2018-02-19 DIAGNOSIS — R5383 Other fatigue: Secondary | ICD-10-CM

## 2018-02-19 DIAGNOSIS — R5382 Chronic fatigue, unspecified: Secondary | ICD-10-CM

## 2018-02-19 MED ORDER — GADOBENATE DIMEGLUMINE 529 MG/ML IV SOLN
10.0000 mL | Freq: Once | INTRAVENOUS | Status: AC | PRN
  Administered 2018-02-19: 10 mL via INTRAVENOUS

## 2018-02-25 DIAGNOSIS — M810 Age-related osteoporosis without current pathological fracture: Secondary | ICD-10-CM | POA: Diagnosis not present

## 2018-02-26 ENCOUNTER — Telehealth: Payer: Self-pay | Admitting: *Deleted

## 2018-02-26 NOTE — Telephone Encounter (Signed)
Spoke with the pt through a vietnamese interpreter with [employer] and informed pt that her brain is normal for her age, there were no concerns. She verbalized understanding and had no further questions. Interpreter name is Thu.

## 2018-02-26 NOTE — Telephone Encounter (Signed)
-----   Message from Anson FretAntonia B Ahern, MD sent at 02/21/2018  8:57 AM EDT ----- MRI brain normal for age thanks

## 2018-03-10 NOTE — Progress Notes (Signed)
Cardiology Office Note    Date:  03/11/2018   ID:  Danielle Richard, DOB July 17, 1951, MRN [id number]  PCP:  Jani Gravel, MD  Cardiologist: Larae Grooms, MD  No chief complaint on file.   History of Present Illness:  Danielle Richard is a 67 y.o. female [place] female with history of chronic chest pain felt to Jaira musculoskeletal, dyspnea on exertion felt secondary to deconditioning.  Last saw Dr. Irish Lack 12/2016 complaining of atypical chest pain and stress test ordered.  Normal nuclear stress test 10/2017 2D echo 02/2017 normal LVEF 60 to 65% with grade 2 DD and mild to moderate AI.  She also has hypertension, HLD on atorvastatin and diabetes.  Patient saw Dr. Jaynee Eagles 02/12/2018 with worsening dizziness but she has had for the past 10 years.  She was not orthostatic in the office that day.  MRI was negative for acute findings.  She comes in today accompanied by her husband and daughter who is acting as Astronomer.  She is complaining of dizziness most days usually when she wakes up in the morning and can last up to an hour.  Also has occasional palpitations.  Daughter is concerned that it is arrhythmic.  Also had a full GI work-up recently with negative results according to the daughter.  She feels like she is getting weaker.  Past Medical History:  Diagnosis Date  . Aortic insufficiency    pt unaware of this  . Depression   . Diabetes mellitus, type II ([place])   . Dizziness    chronic for 10 years and mild  . GERD (gastroesophageal reflux disease)   . H/O mammogram 2016   normal  . History of nuclear stress test 10/16/2017   negative for ischemia  . HLD (hyperlipidemia)   . Hx of colonoscopy 2015   repeat 5 years  . Hypertension   . Insomnia   . Personal history of colonic polyps   . PUD (peptic ulcer disease)     Past Surgical History:  Procedure Laterality Date  . COLONOSCOPY WITH PROPOFOL N/A 09/29/2013   Procedure: COLONOSCOPY WITH PROPOFOL;  Surgeon: Garlan Fair, MD;   Location: [facility];  Service: Endoscopy;  Laterality: N/A;  . ESOPHAGOGASTRODUODENOSCOPY (EGD) WITH PROPOFOL N/A 09/29/2013   Procedure: ESOPHAGOGASTRODUODENOSCOPY (EGD) WITH PROPOFOL;  Surgeon: Garlan Fair, MD;  Location: [facility];  Service: Endoscopy;  Laterality: N/A;  . NO PAST SURGERIES      Current Medications: Current Meds  Medication Sig  . atorvastatin (LIPITOR) 40 MG tablet Take 1 tablet (40 mg total) by mouth daily.  . Blood Glucose Monitoring Suppl (TRUE METRIX METER) DEVI 1 each by Does not apply route 3 (three) times daily before meals.  . calcium-vitamin D (OSCAL WITH D) 500-200 MG-UNIT tablet Take 1 tablet by mouth daily with breakfast.  . citalopram (CELEXA) 20 MG tablet Take 1 tablet (20 mg total) by mouth every morning.  . clotrimazole-betamethasone (LOTRISONE) cream APP EXT AA BID  . dicyclomine (BENTYL) 20 MG tablet TK 1 T PO QID  . fexofenadine (ALLEGRA) 180 MG tablet Take 180 mg by mouth daily.  Marland Kitchen glucose blood (TRUE METRIX BLOOD GLUCOSE TEST) test strip Use 3 times daily before meals  . latanoprost (XALATAN) 0.005 % ophthalmic solution PLACE ONE DROP INTO BOTH EYES AT BEDTIME  . metFORMIN (GLUCOPHAGE) 500 MG tablet TAKE 1 TABLET (500 MG TOTAL) BY MOUTH 2 TIMES DAILY WITH A MEAL.  Marland Kitchen Omega-3 Fatty Acids (FISH OIL) 1200 MG CAPS Take  1,200 mg by mouth daily.  Marland Kitchen omeprazole (PRILOSEC) 40 MG capsule Take 1 capsule by mouth 2 (two) times daily.  Marland Kitchen PROLIA 60 MG/ML SOSY injection INJECT 60 MG Q 6 MONTHS  . sucralfate (CARAFATE) 1 g tablet Take 1 tablet by mouth. On an empty stomach three times daily before meals  . traZODone (DESYREL) 100 MG tablet Take 1 tablet (100 mg total) by mouth at bedtime.  . TRUEPLUS LANCETS 28G MISC 1 each by Does not apply route 3 (three) times daily before meals.  . [DISCONTINUED] dicyclomine (BENTYL) 10 MG capsule Take 1 capsule by mouth 2 (two) times daily as needed.  . [DISCONTINUED] triamterene-hydrochlorothiazide (MAXZIDE-25)  37.5-25 MG tablet Take 1 tablet by mouth daily.     Allergies:   Tylenol [acetaminophen] and Aspirin   Social History   Socioeconomic History  . Marital status: Married    Spouse name: Not on file  . Number of children: 3  . Years of education: Not on file  . Highest education level: High school graduate  Occupational History  . Not on file  Social Needs  . Financial resource strain: Not on file  . Food insecurity:    Worry: Not on file    Inability: Not on file  . Transportation needs:    Medical: Not on file    Non-medical: Not on file  Tobacco Use  . Smoking status: Never Smoker  . Smokeless tobacco: Never Used  Substance and Sexual Activity  . Alcohol use: Not Currently    Alcohol/week: 1.2 oz    Types: 1 Glasses of wine, 1 Cans of beer per week    Comment: socially  . Drug use: No  . Sexual activity: Not on file  Lifestyle  . Physical activity:    Days per week: Not on file    Minutes per session: Not on file  . Stress: Not on file  Relationships  . Social connections:    Talks on phone: Not on file    Gets together: Not on file    Attends religious service: Not on file    Active member of club or organization: Not on file    Attends meetings of clubs or organizations: Not on file    Relationship status: Not on file  Other Topics Concern  . Not on file  Social History Narrative   Lives with husband and child   Right handed     Family History:  The patient's family history includes Breast cancer (age of onset: 38) in her sister; CAD in her mother; Cancer in her father; Diabetes type II in her brother; Heart disease in her father; High Cholesterol in her brother.   ROS:   Please see the history of present illness.    Review of Systems  Constitution: Positive for malaise/fatigue.  HENT: Negative.   Eyes: Negative.   Cardiovascular: Positive for palpitations.  Respiratory: Negative.   Hematologic/Lymphatic: Negative.   Musculoskeletal: Negative.   Negative for joint pain.  Gastrointestinal: Negative.   Genitourinary: Negative.   Neurological: Positive for dizziness and weakness.   All other systems reviewed and are negative.   PHYSICAL EXAM:   VS:  Ht 5' 1.25" (1.556 m)   Wt 107 lb 12.8 oz (48.9 kg)   SpO2 95%   BMI 20.20 kg/m   Physical Exam  GEN: Well nourished, well developed, in no acute distress  Neck: no JVD, carotid bruits, or masses Cardiac:RRR; 1/6 systolic murmur at the left sternal border Respiratory:  clear to auscultation bilaterally, normal work of breathing GI: soft, nontender, nondistended, + BS Ext: without cyanosis, clubbing, or edema, Good distal pulses bilaterally Neuro:  Alert and Oriented x 3 Psych: euthymic mood, full affect  Wt Readings from Last 3 Encounters:  03/11/18 107 lb 12.8 oz (48.9 kg)  02/12/18 108 lb (49 kg)  10/09/17 103 lb (46.7 kg)      Studies/Labs Reviewed:   EKG:  EKG is  ordered today.  The ekg ordered today demonstrates normal sinus rhythm, normal EKG  Recent Labs: 02/12/2018: BUN 9; Creatinine, Ser 0.93; Potassium 4.2; Sodium 141   Lipid Panel    Component Value Date/Time   CHOL 133 07/11/2016 0942   TRIG 133 07/11/2016 0942   HDL 51 07/11/2016 0942   CHOLHDL 2.6 07/11/2016 0942   VLDL 27 07/11/2016 0942   LDLCALC 55 07/11/2016 0942    Additional studies/ records that were reviewed today include:  Nuclear stress test 10/2017  Study Highlights    Nuclear stress EF: 68%. The left ventricular ejection fraction is hyperdynamic (>65%).  The study is normal.  This is a low risk study.  There is no evidence of ischemia. Normal left ventricular systolic function.`     2D echo 5/2018Study Conclusions   - Left ventricle: The cavity size was normal. Wall thickness was   normal. Systolic function was normal. The estimated ejection   fraction was in the range of 60% to 65%. Wall motion was normal;   there were no regional wall motion abnormalities. Features are    consistent with a pseudonormal left ventricular filling pattern,   with concomitant abnormal relaxation and increased filling   pressure (grade 2 diastolic dysfunction). - Aortic valve: There was mild to moderate regurgitation.     ASSESSMENT:    1. Dizziness   2. Essential hypertension   3. Aortic valve insufficiency, etiology of cardiac valve disease unspecified   4. Other chest pain   5. Mixed hyperlipidemia      PLAN:  In order of problems listed above:  Dizziness was not orthostatic at the office visit with neurology 02/12/2018, MRI negative.  Vision is mildly orthostatic in the office today.  Does not drink much water.  Will stop Maxide and start low-dose lisinopril 5 mg once daily for hypertension.  I will see her back in 2 weeks.  At that time she will also have a 30-day monitor placed to rule out arrhythmia.  Essential hypertension now with mild orthostatic hypotension.  Changing Maxide to low-dose lisinopril.  Will check Shantina met at next office visit.  Mild to moderate AI on echo 12/2016   Chronic chest pain felt to Genene musculoskeletal.  Normal nuclear stress test 10/2017  Hyperlipidemia on atorvastatin    Medication Adjustments/Labs and Tests Ordered: Current medicines are reviewed at length with the patient today.  Concerns regarding medicines are outlined above.  Medication changes, Labs and Tests ordered today are listed in the Patient Instructions below. Patient Instructions  Medication Instructions:  Your physician has recommended you make the following change in your medication:  1.  STOP Triamterene-HCTZ 2.  START Lisinopril 5 mg taking 1 tablet daily   Labwork: None ordered  Testing/Procedures: Your physician has recommended that you wear an event monitor [place], PA-C (03/25/18?).  Event monitors are medical devices that record the heart's electrical activity. Doctors most often [place] these monitors to diagnose arrhythmias. Arrhythmias  are problems with the speed or rhythm of the  heartbeat. The monitor is a small, portable device. You can wear one while you do your normal daily activities. This is usually used to diagnose what is causing palpitations/syncope (passing out).    Follow-Up: Your physician recommends that you schedule a follow-up appointment in: [place], PA-C (03/25/18 ARRIVE AT 11:45)   Any Other Special Instructions Will Holly Listed Below (If Applicable).   Cardiac Event Monitoring A cardiac event monitor is a small recording device that is used to detect abnormal heart rhythms (arrhythmias). The monitor is used to record your heart rhythm when you have symptoms, such as:  Fast heartbeats (palpitations), such as heart racing or fluttering.  Dizziness.  Fainting or light-headedness.  Unexplained weakness.  Some monitors are wired to electrodes placed on your chest. Electrodes are flat, sticky disks that attach to your skin. Other monitors may Aiyana hand-held or worn on the wrist. The monitor can Briar worn for up to 30 days. If the monitor is attached to your chest, a technician will prepare your chest for the electrode placement and show you how to work the monitor. Take time to practice using the monitor before you leave the office. Make sure you understand how to send the information from the monitor to your health care provider. In some cases, you may need to use a landline telephone instead of a cell phone. What are the risks? Generally, this device is safe to use, but it possible that the skin under the electrodes will become irritated. How to use your cardiac event monitor  Wear your monitor at all times, except when you are in water: ? Do not let the monitor get wet. ? Take the monitor off when you bathe. Do not swim or use a hot tub with it on.  Keep your skin clean. Do not put body lotion or moisturizer on your chest.  Change the electrodes as told by your health care provider or  any time they stop sticking to your skin. You may need to use medical tape to keep them on.  Try to put the electrodes in slightly different places on your chest to help prevent skin irritation. They must remain in the area under your left breast and in the upper right section of your chest.  Make sure the monitor is safely clipped to your clothing or in a location close to your body that your health care provider recommends.  Press the button to record as soon as you feel heart-related symptoms, such as: ? Dizziness. ? Weakness. ? Light-headedness. ? Palpitations. ? Thumping or pounding in your chest. ? Shortness of breath. ? Unexplained weakness.  Keep a diary of your activities, such as walking, doing chores, and taking medicine. It is very important to note what you were doing when you pushed the button to record your symptoms. This will help your health care provider determine what might Meeyah contributing to your symptoms.  Send the recorded information as recommended by your health care provider. It may take some time for your health care provider to process the results.  Change the batteries as told by your health care provider.  Keep electronic devices away from your monitor. This includes: ? Tablets. ? MP3 players. ? Cell phones.  While wearing your monitor you should avoid: ? Electric blankets. ? Armed forces operational officer. ? Electric toothbrushes. ? Microwave ovens. ? Magnets. ? Metal detectors. Get help right away if:  You have chest pain.  You have extreme difficulty breathing or shortness of  breath.  You develop a very fast heartbeat that persists.  You develop dizziness that does not go away.  You faint or constantly feel like you are about to faint. Summary  A cardiac event monitor is a small recording device that is used to help detect abnormal heart rhythms (arrhythmias).  The monitor is used to record your heart rhythm when you have heart-related  symptoms.  Make sure you understand how to send the information from the monitor to your health care provider.  It is important to press the button on the monitor when you have any heart-related symptoms.  Keep a diary of your activities, such as walking, doing chores, and taking medicine. It is very important to note what you were doing when you pushed the button to record your symptoms. This will help your health care provider learn what might Brieann causing your symptoms. This information is not intended to replace advice given to you by your health care provider. Make sure you discuss any questions you have with your health care provider. Document Released: 05/01/2008 Document Revised: 07/07/2016 Document Reviewed: 07/07/2016 Elsevier Interactive Patient Education  2017 [employer].   If you need a refill on your cardiac medications before your next appointment, please call your pharmacy.      Sumner Boast, PA-C  03/11/2018 9:54 AM    [facility] [address], [place], [place]  [address] Phone: [phone]; Fax: [phone]

## 2018-03-11 ENCOUNTER — Encounter: Payer: Self-pay | Admitting: Physician Assistant

## 2018-03-11 ENCOUNTER — Ambulatory Visit (INDEPENDENT_AMBULATORY_CARE_PROVIDER_SITE_OTHER): Payer: Medicare Other | Admitting: Physician Assistant

## 2018-03-11 VITALS — BP 135/80 | HR 62 | Ht 61.25 in | Wt 107.8 lb

## 2018-03-11 DIAGNOSIS — I10 Essential (primary) hypertension: Secondary | ICD-10-CM | POA: Diagnosis not present

## 2018-03-11 DIAGNOSIS — R0789 Other chest pain: Secondary | ICD-10-CM | POA: Diagnosis not present

## 2018-03-11 DIAGNOSIS — I351 Nonrheumatic aortic (valve) insufficiency: Secondary | ICD-10-CM | POA: Diagnosis not present

## 2018-03-11 DIAGNOSIS — R42 Dizziness and giddiness: Secondary | ICD-10-CM | POA: Diagnosis not present

## 2018-03-11 DIAGNOSIS — E782 Mixed hyperlipidemia: Secondary | ICD-10-CM | POA: Diagnosis not present

## 2018-03-11 MED ORDER — LISINOPRIL 5 MG PO TABS: 5.0000 mg | ORAL_TABLET | Freq: Every day | ORAL | 3 refills | Status: DC

## 2018-03-11 NOTE — Patient Instructions (Addendum)
Medication Instructions:  Your physician has recommended you make the following change in your medication:  1.  STOP Triamterene-HCTZ 2.  START Lisinopril 5 mg taking 1 tablet daily   Labwork: None ordered  Testing/Procedures: Your physician has recommended that you wear an event monitor ON SAME DAY SHE SEES MICHELE LENZE, PA-C (03/25/18?).  Event monitors are medical devices that record the heart's electrical activity. Doctors most often us these monitors to diagnose arrhythmias. Arrhythmias are problems with the speed or rhythm of the heartbeat. The monitor is a small, portable device. You can wear one while you do your normal daily activities. This is usually used to diagnose what is causing palpitations/syncope (passing out).    Follow-Up: Your physician recommends that you schedule a follow-up appointment in: 2 WEEKS WITH MICHELE LENZE, PA-C (03/25/18 ARRIVE AT 11:45)   Any Other Special Instructions Will [place] Listed Below (If Applicable).   Cardiac Event Monitoring A cardiac event monitor is a small recording device that is used to detect abnormal heart rhythms (arrhythmias). The monitor is used to record your heart rhythm when you have symptoms, such as:  Fast heartbeats (palpitations), such as heart racing or fluttering.  Dizziness.  Fainting or light-headedness.  Unexplained weakness.  Some monitors are wired to electrodes placed on your chest. Electrodes are flat, sticky disks that attach to your skin. Other monitors may Vantasia hand-held or worn on the wrist. The monitor can Zane worn for up to 30 days. If the monitor is attached to your chest, a technician will prepare your chest for the electrode placement and show you how to work the monitor. Take time to practice using the monitor before you leave the office. Make sure you understand how to send the information from the monitor to your health care provider. In some cases, you may need to use a landline telephone instead of a cell  phone. What are the risks? Generally, this device is safe to use, but it possible that the skin under the electrodes will become irritated. How to use your cardiac event monitor  Wear your monitor at all times, except when you are in water: ? Do not let the monitor get wet. ? Take the monitor off when you bathe. Do not swim or use a hot tub with it on.  Keep your skin clean. Do not put body lotion or moisturizer on your chest.  Change the electrodes as told by your health care provider or any time they stop sticking to your skin. You may need to use medical tape to keep them on.  Try to put the electrodes in slightly different places on your chest to help prevent skin irritation. They must remain in the area under your left breast and in the upper right section of your chest.  Make sure the monitor is safely clipped to your clothing or in a location close to your body that your health care provider recommends.  Press the button to record as soon as you feel heart-related symptoms, such as: ? Dizziness. ? Weakness. ? Light-headedness. ? Palpitations. ? Thumping or pounding in your chest. ? Shortness of breath. ? Unexplained weakness.  Keep a diary of your activities, such as walking, doing chores, and taking medicine. It is very important to note what you were doing when you pushed the button to record your symptoms. This will help your health care provider determine what might Dempsey contributing to your symptoms.  Send the recorded information as recommended by your health care provider.  It may take some time for your health care provider to process the results.  Change the batteries as told by your health care provider.  Keep electronic devices away from your monitor. This includes: ? Tablets. ? MP3 players. ? Cell phones.  While wearing your monitor you should avoid: ? Electric blankets. ? Firefighter. ? Electric toothbrushes. ? Microwave ovens. ? Magnets. ? Metal  detectors. Get help right away if:  You have chest pain.  You have extreme difficulty breathing or shortness of breath.  You develop a very fast heartbeat that persists.  You develop dizziness that does not go away.  You faint or constantly feel like you are about to faint. Summary  A cardiac event monitor is a small recording device that is used to help detect abnormal heart rhythms (arrhythmias).  The monitor is used to record your heart rhythm when you have heart-related symptoms.  Make sure you understand how to send the information from the monitor to your health care provider.  It is important to press the button on the monitor when you have any heart-related symptoms.  Keep a diary of your activities, such as walking, doing chores, and taking medicine. It is very important to note what you were doing when you pushed the button to record your symptoms. This will help your health care provider learn what might Delayna causing your symptoms. This information is not intended to replace advice given to you by your health care provider. Make sure you discuss any questions you have with your health care provider. Document Released: 05/01/2008 Document Revised: 07/07/2016 Document Reviewed: 07/07/2016 Elsevier Interactive Patient Education  2017 [employer].   If you need a refill on your cardiac medications before your next appointment, please call your pharmacy.

## 2018-03-25 ENCOUNTER — Ambulatory Visit (INDEPENDENT_AMBULATORY_CARE_PROVIDER_SITE_OTHER): Payer: Medicare Other | Admitting: Physician Assistant

## 2018-03-25 ENCOUNTER — Other Ambulatory Visit: Payer: Self-pay | Admitting: Physician Assistant

## 2018-03-25 ENCOUNTER — Ambulatory Visit: Payer: Medicare Other

## 2018-03-25 ENCOUNTER — Encounter: Payer: Self-pay | Admitting: Physician Assistant

## 2018-03-25 DIAGNOSIS — R0789 Other chest pain: Secondary | ICD-10-CM | POA: Diagnosis not present

## 2018-03-25 DIAGNOSIS — I351 Nonrheumatic aortic (valve) insufficiency: Secondary | ICD-10-CM

## 2018-03-25 DIAGNOSIS — R42 Dizziness and giddiness: Secondary | ICD-10-CM | POA: Diagnosis not present

## 2018-03-25 DIAGNOSIS — I10 Essential (primary) hypertension: Secondary | ICD-10-CM | POA: Diagnosis not present

## 2018-03-25 DIAGNOSIS — E782 Mixed hyperlipidemia: Secondary | ICD-10-CM

## 2018-03-25 DIAGNOSIS — R002 Palpitations: Secondary | ICD-10-CM

## 2018-03-25 NOTE — Patient Instructions (Addendum)
Medication Instructions:  Your physician recommends that you continue on your current medications as directed. Please refer to the Current Medication list given to you today.   Labwork: TODAY: BMET  Testing/Procedures: None ordered  Follow-Up: Your physician wants you to follow-up with Jacolyn ReedyMICHELE LENZE, NP ON September 10TH AT 11:30 AM.  Any Other Special Instructions Will [place] Listed Below (If Applicable).     If you need a refill on your cardiac medications before your next appointment, please call your pharmacy.

## 2018-03-25 NOTE — Progress Notes (Signed)
Cardiology Office Note    Date:  03/25/2018   ID:  Danielle Richard, [place] Oct 05, 1950, MRN [id number]  PCP:  Jani Gravel, MD  Cardiologist: Larae Grooms, MD  Chief Complaint  Patient presents with  . Follow-up    History of Present Illness:  Danielle Richard is a 67 y.o. female [place] female with history of chronic chest pain felt to Ashiya musculoskeletal, dyspnea on exertion felt secondary to deconditioning.  Last saw Dr. Irish Lack 12/2016 complaining of atypical chest pain and stress test ordered.  Normal nuclear stress test 10/2017 2D echo 02/2017 normal LVEF 60 to 65% with grade 2 DD and mild to moderate AI.  She also has hypertension, HLD on atorvastatin and diabetes.   Patient saw Dr. Jaynee Eagles 02/12/2018 with worsening dizziness but she has had for the past 10 years.  She was not orthostatic in the office that day.  MRI was negative for acute findings.  I saw the patient 03/11/2018 with still complaining of dizziness usually when she wakes up in the morning.  The daughter was worried she had arrhythmias.  She was mildly orthostatic in our office.  She was not drinking much water.  I stopped her Maxide and start low-dose lisinopril for daily hypertension.  30-day monitor will also Niema placed.  Patient comes in today accompanied by her daughter who is a Marine scientist at [employer]. She no longer has dizziness since Maxide stopped so doesn't want to have the Holter monitor.  She did develop a cough since starting the lisinopril but has had a cough in the past felt secondary to allergies and was briefly on steroids by her PCP.  Her pressure has been well controlled according to her daughter.       Past Medical History:  Diagnosis Date  . Aortic insufficiency    pt unaware of this  . Depression   . Diabetes mellitus, type II ([facility])   . Dizziness    chronic for 10 years and mild  . GERD (gastroesophageal reflux disease)   . H/O mammogram 2016   normal  . History of nuclear stress test 10/16/2017   negative for ischemia  . HLD (hyperlipidemia)   . Hx of colonoscopy 2015   repeat 5 years  . Hypertension   . Insomnia   . Personal history of colonic polyps   . PUD (peptic ulcer disease)     Past Surgical History:  Procedure Laterality Date  . COLONOSCOPY WITH PROPOFOL N/A 09/29/2013   Procedure: COLONOSCOPY WITH PROPOFOL;  Surgeon: Garlan Fair, MD;  Location: [facility];  Service: Endoscopy;  Laterality: N/A;  . ESOPHAGOGASTRODUODENOSCOPY (EGD) WITH PROPOFOL N/A 09/29/2013   Procedure: ESOPHAGOGASTRODUODENOSCOPY (EGD) WITH PROPOFOL;  Surgeon: Garlan Fair, MD;  Location: [facility];  Service: Endoscopy;  Laterality: N/A;  . NO PAST SURGERIES      Current Medications: Current Meds  Medication Sig  . atorvastatin (LIPITOR) 40 MG tablet Take 1 tablet (40 mg total) by mouth daily.  . Blood Glucose Monitoring Suppl (TRUE METRIX METER) DEVI 1 each by Does not apply route 3 (three) times daily before meals.  . calcium-vitamin D (OSCAL WITH D) 500-200 MG-UNIT tablet Take 1 tablet by mouth daily with breakfast.  . citalopram (CELEXA) 20 MG tablet Take 1 tablet (20 mg total) by mouth every morning.  . clotrimazole-betamethasone (LOTRISONE) cream APP EXT AA BID  . dicyclomine (BENTYL) 20 MG tablet TK 1 T PO QID  . fexofenadine (ALLEGRA) 180 MG tablet Take 180 mg  by mouth daily.  Marland Kitchen glucose blood (TRUE METRIX BLOOD GLUCOSE TEST) test strip Use 3 times daily before meals  . latanoprost (XALATAN) 0.005 % ophthalmic solution PLACE ONE DROP INTO BOTH EYES AT BEDTIME  . lisinopril (PRINIVIL,ZESTRIL) 5 MG tablet Take 1 tablet (5 mg total) by mouth daily.  . metFORMIN (GLUCOPHAGE) 500 MG tablet TAKE 1 TABLET (500 MG TOTAL) BY MOUTH 2 TIMES DAILY WITH A MEAL.  Marland Kitchen Omega-3 Fatty Acids (FISH OIL) 1200 MG CAPS Take 1,200 mg by mouth daily.  Marland Kitchen omeprazole (PRILOSEC) 40 MG capsule Take 1 capsule by mouth 2 (two) times daily.  Marland Kitchen PROLIA 60 MG/ML SOSY injection INJECT 60 MG Q 6 MONTHS  . sucralfate  (CARAFATE) 1 g tablet Take 1 tablet by mouth. On an empty stomach three times daily before meals  . traZODone (DESYREL) 100 MG tablet Take 1 tablet (100 mg total) by mouth at bedtime.  . TRUEPLUS LANCETS 28G MISC 1 each by Does not apply route 3 (three) times daily before meals.     Allergies:   Tylenol [acetaminophen] and Aspirin   Social History   Socioeconomic History  . Marital status: Married    Spouse name: Not on file  . Number of children: 3  . Years of education: Not on file  . Highest education level: High school graduate  Occupational History  . Not on file  Social Needs  . Financial resource strain: Not on file  . Food insecurity:    Worry: Not on file    Inability: Not on file  . Transportation needs:    Medical: Not on file    Non-medical: Not on file  Tobacco Use  . Smoking status: Never Smoker  . Smokeless tobacco: Never Used  Substance and Sexual Activity  . Alcohol use: Not Currently    Alcohol/week: 2.0 standard drinks    Types: 1 Glasses of wine, 1 Cans of beer per week    Comment: socially  . Drug use: No  . Sexual activity: Not on file  Lifestyle  . Physical activity:    Days per week: Not on file    Minutes per session: Not on file  . Stress: Not on file  Relationships  . Social connections:    Talks on phone: Not on file    Gets together: Not on file    Attends religious service: Not on file    Active member of club or organization: Not on file    Attends meetings of clubs or organizations: Not on file    Relationship status: Not on file  Other Topics Concern  . Not on file  Social History Narrative   Lives with husband and child   Right handed     Family History:  The patient's family history includes Breast cancer (age of onset: 45) in her sister; CAD in her mother; Cancer in her father; Diabetes type II in her brother; Heart disease in her father; High Cholesterol in her brother.   ROS:   Please see the history of present illness.     Review of Systems  Respiratory: Positive for cough.    All other systems reviewed and are negative.   PHYSICAL EXAM:   VS:  BP 136/78   Pulse (!) 58   Ht '5\' 1"'$  (1.549 m)   Wt 110 lb 12.8 oz (50.3 kg)   SpO2 98%   BMI 20.94 kg/m   Physical Exam  GEN: Well nourished, well developed, in no acute distress  Neck: no JVD, carotid bruits, or masses Cardiac:RRR; no murmurs, rubs, or gallops  Respiratory:  clear to auscultation bilaterally, normal work of breathing GI: soft, nontender, nondistended, + BS Ext: without cyanosis, clubbing, or edema, Good distal pulses bilaterally Neuro:  Alert and Oriented x 3 Psych: euthymic mood, full affect  Wt Readings from Last 3 Encounters:  03/25/18 110 lb 12.8 oz (50.3 kg)  03/11/18 107 lb 12.8 oz (48.9 kg)  02/12/18 108 lb (49 kg)      Studies/Labs Reviewed:   EKG:  EKG is not ordered today.    Recent Labs: 02/12/2018: BUN 9; Creatinine, Ser 0.93; Potassium 4.2; Sodium 141   Lipid Panel    Component Value Date/Time   CHOL 133 07/11/2016 0942   TRIG 133 07/11/2016 0942   HDL 51 07/11/2016 0942   CHOLHDL 2.6 07/11/2016 0942   VLDL 27 07/11/2016 0942   LDLCALC 55 07/11/2016 0942    Additional studies/ records that were reviewed today include:   Nuclear stress test 10/2017   Study Highlights     Nuclear stress EF: 68%. The left ventricular ejection fraction is hyperdynamic (>65%).  The study is normal.  This is a low risk study.  There is no evidence of ischemia. Normal left ventricular systolic function.`      2D echo 5/2018Study Conclusions   - Left ventricle: The cavity size was normal. Wall thickness was   normal. Systolic function was normal. The estimated ejection   fraction was in the range of 60% to 65%. Wall motion was normal;   there were no regional wall motion abnormalities. Features are   consistent with a pseudonormal left ventricular filling pattern,   with concomitant abnormal relaxation and increased  filling   pressure (grade 2 diastolic dysfunction). - Aortic valve: There was mild to moderate regurgitation.      ASSESSMENT:    1. Dizziness   2. Essential hypertension   3. Aortic valve insufficiency, etiology of cardiac valve disease unspecified   4. Other chest pain   5. Mixed hyperlipidemia      PLAN:  In order of problems listed above:  Dizziness with mild orthostasis last office visit Maxide stopped and she was encouraged to drink more water.  MRI was negative-02/12/2018.  Doing much better off the Maxide no further dizziness.  Essential hypertension on low-dose lisinopril and blood pressure well controlled but now complaining of cough which could Emelly allergy related.  Will check Shailey met today.  Try to continue low-dose lisinopril and treat her allergies as well for couple weeks and reassess her cough.  We may need to switch her lisinopril.  Mild to moderate AI on echo 12/2016  Chronic chest pain felt to Tieisha musculoskeletal with normal nuclear stress test 10/2017  Hyperlipidemia on atorvastatin    Medication Adjustments/Labs and Tests Ordered: Current medicines are reviewed at length with the patient today.  Concerns regarding medicines are outlined above.  Medication changes, Labs and Tests ordered today are listed in the Patient Instructions below. There are no Patient Instructions on file for this visit.   Sumner Boast, PA-C  03/25/2018 12:05 PM    [facility] [place], [place], [place]  [address] Phone: [phone]; Fax: [phone]

## 2018-03-26 LAB — BASIC METABOLIC PANEL
BUN/Creatinine Ratio: 14 (ref 12–28)
BUN: 11 mg/dL (ref 8–27)
CO2: 24 mmol/L (ref 20–29)
Calcium: 9.3 mg/dL (ref 8.7–10.3)
Chloride: 104 mmol/L (ref 96–106)
Creatinine, Ser: 0.81 mg/dL (ref 0.57–1.00)
GFR calc Af Amer: 88 mL/min/{1.73_m2} (ref >59)
GFR calc non Af Amer: 76 mL/min/{1.73_m2} (ref >59)
Glucose: 94 mg/dL (ref 65–99)
Potassium: 4 mmol/L (ref 3.5–5.2)
Sodium: 147 mmol/L — ABNORMAL HIGH (ref 134–144)

## 2018-04-14 NOTE — Progress Notes (Signed)
Cardiology Office Note    Date:  04/15/2018   ID:  Danielle Richard, DOB 12-Aug-1950, MRN [id number]  PCP:  Pearson Grippe, MD  Cardiologist: Lance Muss, MD EPS: None  Chief Complaint  Patient presents with  . Follow-up    History of Present Illness:  Danielle Richard is a 67 y.o. female [name] with history of chronic chest pain felt to Deni musculoskeletal, dyspnea on exertion felt secondary to deconditioning.  Last saw Dr. Eldridge Dace 12/2016 complaining of atypical chest pain and stress test ordered.  Normal nuclear stress test 10/2017 2D echo 02/2017 normal LVEF 60 to 65% with grade 2 DD and mild to moderate AI.  She also has hypertension, HLD on atorvastatin and diabetes.   Patient saw Dr. Lucia Gaskins 02/12/2018 with worsening dizziness but she has had for the past 10 years.  She was not orthostatic in the office that day.  MRI was negative for acute findings.  When I saw the patient 03/11/2018 she was mildly orthostatic and had not been drinking much water.  I stopped her Maxide and start low-dose lisinopril for hypertension.  Also placed a 30-day monitor.  Saw her back 03/25/2018 and her dizziness had resolved.  She did not want to wear the monitor.  She did develop a cough on the lisinopril but has had a cough in the past felt secondary to allergies and was briefly on steroids by PCP.  I asked her to continue low-dose lisinopril since it was working well for her blood pressure and treat her allergies.   Patient comes in today with her daughter. She stopped Allegra because it made her fatigued.  Has a little bit of cough at night.  Patient does not want to change her medication.  Thinks she has allergies and asking for steroids.  Blood pressure up today.  Patient says she eats a low-salt diet but does use a lot of fish sauce and soy sauce in her cooking.     Past Medical History:  Diagnosis Date  . Aortic insufficiency    pt unaware of this  . Depression   . Diabetes mellitus, type II ([facility])   .  Dizziness    chronic for 10 years and mild  . GERD (gastroesophageal reflux disease)   . H/O mammogram 2016   normal  . History of nuclear stress test 10/16/2017   negative for ischemia  . HLD (hyperlipidemia)   . Hx of colonoscopy 2015   repeat 5 years  . Hypertension   . Insomnia   . Personal history of colonic polyps   . PUD (peptic ulcer disease)     Past Surgical History:  Procedure Laterality Date  . COLONOSCOPY WITH PROPOFOL N/A 09/29/2013   Procedure: COLONOSCOPY WITH PROPOFOL;  Surgeon: Charolett Bumpers, MD;  Location: [facility];  Service: Endoscopy;  Laterality: N/A;  . ESOPHAGOGASTRODUODENOSCOPY (EGD) WITH PROPOFOL N/A 09/29/2013   Procedure: ESOPHAGOGASTRODUODENOSCOPY (EGD) WITH PROPOFOL;  Surgeon: Charolett Bumpers, MD;  Location: [facility];  Service: Endoscopy;  Laterality: N/A;  . NO PAST SURGERIES      Current Medications: Current Meds  Medication Sig  . atorvastatin (LIPITOR) 40 MG tablet Take 1 tablet (40 mg total) by mouth daily.  . Blood Glucose Monitoring Suppl (TRUE METRIX METER) DEVI 1 each by Does not apply route 3 (three) times daily before meals.  . calcium-vitamin D (OSCAL WITH D) 500-200 MG-UNIT tablet Take 1 tablet by mouth daily with breakfast.  . citalopram (CELEXA) 20 MG tablet  Take 1 tablet (20 mg total) by mouth every morning.  . clotrimazole-betamethasone (LOTRISONE) cream APP EXT AA BID  . dicyclomine (BENTYL) 20 MG tablet TK 1 T PO QID  . fexofenadine (ALLEGRA) 180 MG tablet Take 180 mg by mouth daily.  Marland Kitchen glucose blood (TRUE METRIX BLOOD GLUCOSE TEST) test strip Use 3 times daily before meals  . latanoprost (XALATAN) 0.005 % ophthalmic solution PLACE ONE DROP INTO BOTH EYES AT BEDTIME  . metFORMIN (GLUCOPHAGE) 500 MG tablet TAKE 1 TABLET (500 MG TOTAL) BY MOUTH 2 TIMES DAILY WITH A MEAL.  Marland Kitchen Omega-3 Fatty Acids (FISH OIL) 1200 MG CAPS Take 1,200 mg by mouth daily.  Marland Kitchen omeprazole (PRILOSEC) 40 MG capsule Take 1 capsule by mouth 2 (two) times  daily.  Marland Kitchen PROLIA 60 MG/ML SOSY injection INJECT 60 MG Q 6 MONTHS  . sucralfate (CARAFATE) 1 g tablet Take 1 tablet by mouth. On an empty stomach three times daily before meals  . traZODone (DESYREL) 100 MG tablet Take 1 tablet (100 mg total) by mouth at bedtime.  . TRUEPLUS LANCETS 28G MISC 1 each by Does not apply route 3 (three) times daily before meals.  . [DISCONTINUED] lisinopril (PRINIVIL,ZESTRIL) 5 MG tablet Take 1 tablet (5 mg total) by mouth daily.     Allergies:   Tylenol [acetaminophen] and Aspirin   Social History   Socioeconomic History  . Marital status: Married    Spouse name: Not on file  . Number of children: 3  . Years of education: Not on file  . Highest education level: High school graduate  Occupational History  . Not on file  Social Needs  . Financial resource strain: Not on file  . Food insecurity:    Worry: Not on file    Inability: Not on file  . Transportation needs:    Medical: Not on file    Non-medical: Not on file  Tobacco Use  . Smoking status: Never Smoker  . Smokeless tobacco: Never Used  Substance and Sexual Activity  . Alcohol use: Not Currently    Alcohol/week: 2.0 standard drinks    Types: 1 Glasses of wine, 1 Cans of beer per week    Comment: socially  . Drug use: No  . Sexual activity: Not on file  Lifestyle  . Physical activity:    Days per week: Not on file    Minutes per session: Not on file  . Stress: Not on file  Relationships  . Social connections:    Talks on phone: Not on file    Gets together: Not on file    Attends religious service: Not on file    Active member of club or organization: Not on file    Attends meetings of clubs or organizations: Not on file    Relationship status: Not on file  Other Topics Concern  . Not on file  Social History Narrative   Lives with husband and child   Right handed     Family History:  The patient's family history includes Breast cancer (age of onset: 46) in her sister; CAD in  her mother; Cancer in her father; Diabetes type II in her brother; Heart disease in her father; High Cholesterol in her brother.   ROS:   Please see the history of present illness.    Review of Systems  HENT: Positive for congestion.   Respiratory: Positive for cough.    All other systems reviewed and are negative.   PHYSICAL EXAM:  VS:  BP (!) 152/72 (BP Location: Left Arm, Patient Position: Sitting, Cuff Size: Normal)   Pulse 63   Ht 5\' 1"  (1.549 m)   Wt 108 lb (49 kg)   SpO2 98%   BMI 20.41 kg/m   Physical Exam  GEN: Well nourished, well developed, in no acute distress  Neck: no JVD, carotid bruits, or masses Cardiac:RRR; 2/6 diastolic murmur at the left sternal border respiratory:  clear to auscultation bilaterally, normal work of breathing GI: soft, nontender, nondistended, + BS Ext: without cyanosis, clubbing, or edema, Good distal pulses bilaterally Neuro:  Alert and Oriented x 3 Psych: euthymic mood, full affect  Wt Readings from Last 3 Encounters:  04/15/18 108 lb (49 kg)  03/25/18 110 lb 12.8 oz (50.3 kg)  03/11/18 107 lb 12.8 oz (48.9 kg)      Studies/Labs Reviewed:   EKG:  EKG is not ordered today.   Recent Labs: 03/25/2018: BUN 11; Creatinine, Ser 0.81; Potassium 4.0; Sodium 147   Lipid Panel    Component Value Date/Time   CHOL 133 07/11/2016 0942   TRIG 133 07/11/2016 0942   HDL 51 07/11/2016 0942   CHOLHDL 2.6 07/11/2016 0942   VLDL 27 07/11/2016 0942   LDLCALC 55 07/11/2016 0942    Additional studies/ records that were reviewed today include:    2D echo 7/16/2018Study Conclusions   - Left ventricle: The cavity size was normal. Wall thickness was   normal. Systolic function was normal. The estimated ejection   fraction was in the range of 60% to 65%. Wall motion was normal;   there were no regional wall motion abnormalities. Features are   consistent with a pseudonormal left ventricular filling pattern,   with concomitant abnormal relaxation  and increased filling   pressure (grade 2 diastolic dysfunction). - Aortic valve: There was mild to moderate regurgitation.        ASSESSMENT:    1. Essential hypertension   2. Dizziness   3. Aortic valve insufficiency, etiology of cardiac valve disease unspecified   4. Other chest pain   5. Mixed hyperlipidemia      PLAN:  In order of problems listed above:  Essential hypertension blood pressure up today suspect secondary to dietary indiscretion.  Increase lisinopril to 10 mg once daily.  Add Zyrtec or Claritin for cough.  Follow-up with PCP for possible allergies.  2 g sodium diet.  Follow-up in 3 to 4 weeks with myself or Dr. Eldridge Dace  Dizziness resolved off Maxide  Aortic insufficiency mild to moderate on echo 12/2016  Chronic chest pain felt to Archana minimal musculoskeletal with normal nuclear stress test 10/2017  Hyperlipidemia on atorvastatin    Medication Adjustments/Labs and Tests Ordered: Current medicines are reviewed at length with the patient today.  Concerns regarding medicines are outlined above.  Medication changes, Labs and Tests ordered today are listed in the Patient Instructions below. There are no Patient Instructions on file for this visit.   Elson Clan, PA-C  04/15/2018 12:11 PM    [facility] [address] [place], [place], [place]  [address] Phone: [phone]; Fax: [phone]

## 2018-04-15 ENCOUNTER — Ambulatory Visit (INDEPENDENT_AMBULATORY_CARE_PROVIDER_SITE_OTHER): Payer: Medicare Other | Admitting: Physician Assistant

## 2018-04-15 ENCOUNTER — Encounter: Payer: Self-pay | Admitting: Physician Assistant

## 2018-04-15 VITALS — BP 152/72 | HR 63 | Ht 61.0 in | Wt 108.0 lb

## 2018-04-15 DIAGNOSIS — I10 Essential (primary) hypertension: Secondary | ICD-10-CM | POA: Diagnosis not present

## 2018-04-15 DIAGNOSIS — R0789 Other chest pain: Secondary | ICD-10-CM

## 2018-04-15 DIAGNOSIS — E782 Mixed hyperlipidemia: Secondary | ICD-10-CM

## 2018-04-15 DIAGNOSIS — R42 Dizziness and giddiness: Secondary | ICD-10-CM

## 2018-04-15 DIAGNOSIS — I351 Nonrheumatic aortic (valve) insufficiency: Secondary | ICD-10-CM

## 2018-04-15 MED ORDER — LISINOPRIL 10 MG PO TABS: 10.0000 mg | ORAL_TABLET | Freq: Every day | ORAL | 3 refills | Status: DC

## 2018-04-15 NOTE — Patient Instructions (Signed)
Medication Instructions:  Your physician has recommended you make the following change in your medication:   1. INCREASE: lisinopril to 10 mg once a day  2. You may take zyrtec or claritin for your cough/allergies   Labwork: None ordered  Testing/Procedures: None ordered  Follow-Up: Your physician recommends that you schedule a follow-up appointment with your Primary Care Doctor for allergies  Your physician recommends that you follow-up with Danielle Reedy, PA on 05/21/18 at 9:30 AM    Any Other Special Instructions Will Aileana Listed Below (If Applicable).  Two Gram Sodium Diet 2000 mg  What is Sodium? Sodium is a mineral found naturally in many foods. The most significant source of sodium in the diet is table salt, which is about 40% sodium.  Processed, convenience, and preserved foods also contain a large amount of sodium.  The body needs only 500 mg of sodium daily to function,  A normal diet provides more than enough sodium even if you do not use salt.  Why Limit Sodium? A build up of sodium in the body can cause thirst, increased blood pressure, shortness of breath, and water retention.  Decreasing sodium in the diet can reduce edema and risk of heart attack or stroke associated with high blood pressure.  Keep in mind that there are many other factors involved in these health problems.  Heredity, obesity, lack of exercise, cigarette smoking, stress and what you eat all play a role.  General Guidelines:  Do not add salt at the table or in cooking.  One teaspoon of salt contains over 2 grams of sodium.  Read food labels  Avoid processed and convenience foods  Ask your dietitian before eating any foods not dicussed in the menu planning guidelines  Consult your physician if you wish to use a salt substitute or a sodium containing medication such as antacids.  Limit milk and milk products to 16 oz (2 cups) per day.  Shopping Hints:  READ LABELS!! "Dietetic" does not  necessarily mean low sodium.  Salt and other sodium ingredients are often added to foods during processing.   Menu Planning Guidelines Food Group Choose More Often Avoid  Beverages (see also the milk group All fruit juices, low-sodium, salt-free vegetables juices, low-sodium carbonated beverages Regular vegetable or tomato juices, commercially softened water used for drinking or cooking  Breads and Cereals Enriched white, wheat, rye and pumpernickel bread, hard rolls and dinner rolls; muffins, cornbread and waffles; most dry cereals, cooked cereal without added salt; unsalted crackers and breadsticks; low sodium or homemade bread crumbs Bread, rolls and crackers with salted tops; quick breads; instant hot cereals; pancakes; commercial bread stuffing; self-rising flower and biscuit mixes; regular bread crumbs or cracker crumbs  Desserts and Sweets Desserts and sweets mad with mild should Jaydalynn within allowance Instant pudding mixes and cake mixes  Fats Butter or margarine; vegetable oils; unsalted salad dressings, regular salad dressings limited to 1 Tbs; light, sour and heavy cream Regular salad dressings containing bacon fat, bacon bits, and salt pork; snack dips made with instant soup mixes or processed cheese; salted nuts  Fruits Most fresh, frozen and canned fruits Fruits processed with salt or sodium-containing ingredient (some dried fruits are processed with sodium sulfites        Vegetables Fresh, frozen vegetables and low- sodium canned vegetables Regular canned vegetables, sauerkraut, pickled vegetables, and others prepared in brine; frozen vegetables in sauces; vegetables seasoned with ham, bacon or salt pork  Condiments, Sauces, Miscellaneous  Salt substitute with physician's  approval; pepper, herbs, spices; vinegar, lemon or lime juice; hot pepper sauce; garlic powder, onion powder, low sodium soy sauce (1 Tbs.); low sodium condiments (ketchup, chili sauce, mustard) in limited amounts (1  tsp.) fresh ground horseradish; unsalted tortilla chips, pretzels, potato chips, popcorn, salsa (1/4 cup) Any seasoning made with salt including garlic salt, celery salt, onion salt, and seasoned salt; sea salt, rock salt, kosher salt; meat tenderizers; monosodium glutamate; mustard, regular soy sauce, barbecue, sauce, chili sauce, teriyaki sauce, steak sauce, Worcestershire sauce, and most flavored vinegars; canned gravy and mixes; regular condiments; salted snack foods, olives, picles, relish, horseradish sauce, catsup   Food preparation: Try these seasonings Meats:    Pork Sage, onion Serve with applesauce  Chicken Poultry seasoning, thyme, parsley Serve with cranberry sauce  Lamb Curry powder, rosemary, garlic, thyme Serve with mint sauce or jelly  Veal Marjoram, basil Serve with current jelly, cranberry sauce  Beef Pepper, bay leaf Serve with dry mustard, unsalted chive butter  Fish [place] leaf, dill Serve with unsalted lemon butter, unsalted parsley butter  Vegetables:    Asparagus Lemon juice   Broccoli Lemon juice   Carrots Mustard dressing parsley, mint, nutmeg, glazed with unsalted butter and sugar   Green beans Marjoram, lemon juice, nutmeg,dill seed   Tomatoes Basil, marjoram, onion   Spice /blend for [employer]" 4 tsp ground thyme 1 tsp ground sage 3 tsp ground rosemary 4 tsp ground marjoram   Test your knowledge 1. A product that says "Salt Free" may still contain sodium. True or False 2. Garlic Powder and Hot Pepper Sauce an Maxi used as alternative seasonings.True or False 3. Processed foods have more sodium than fresh foods.  True or False 4. Canned Vegetables have less sodium than froze True or False  WAYS TO DECREASE YOUR SODIUM INTAKE 1. Avoid the use of added salt in cooking and at the table.  Table salt (and other prepared seasonings which contain salt) is probably one of the greatest sources of sodium in the diet.  Unsalted foods can gain flavor from the sweet, sour,  and butter taste sensations of herbs and spices.  Instead of using salt for seasoning, try the following seasonings with the foods listed.  Remember: how you use them to enhance natural food flavors is limited only by your creativity... Allspice-Meat, fish, eggs, fruit, peas, red and yellow vegetables Almond Extract-Fruit baked goods Anise Seed-Sweet breads, fruit, carrots, beets, cottage cheese, cookies (tastes like licorice) Basil-Meat, fish, eggs, vegetables, rice, vegetables salads, soups, sauces Bay Leaf-Meat, fish, stews, poultry Burnet-Salad, vegetables (cucumber-like flavor) Caraway Seed-Bread, cookies, cottage cheese, meat, vegetables, cheese, rice Cardamon-Baked goods, fruit, soups Celery Powder or seed-Salads, salad dressings, sauces, meatloaf, soup, bread.Do not use  celery salt Chervil-Meats, salads, fish, eggs, vegetables, cottage cheese (parsley-like flavor) Chili Power-Meatloaf, chicken cheese, corn, eggplant, egg dishes Chives-Salads cottage cheese, egg dishes, soups, vegetables, sauces Cilantro-Salsa, casseroles Cinnamon-Baked goods, fruit, pork, lamb, chicken, carrots Cloves-Fruit, baked goods, fish, pot roast, green beans, beets, carrots Coriander-Pastry, cookies, meat, salads, cheese (lemon-orange flavor) Cumin-Meatloaf, fish,cheese, eggs, cabbage,fruit pie (caraway flavor) [employer], fruit, eggs, fish, poultry, cottage cheese, vegetables Dill Seed-Meat, cottage cheese, poultry, vegetables, fish, salads, bread Fennel Seed-Bread, cookies, apples, pork, eggs, fish, beets, cabbage, cheese, Licorice-like flavor Garlic-(buds or powder) Salads, meat, poultry, fish, bread, butter, vegetables, potatoes.Do not  use garlic salt Ginger-Fruit, vegetables, baked goods, meat, fish, poultry Horseradish Root-Meet, vegetables, butter Lemon Juice or Extract-Vegetables, fruit, tea, baked goods, fish salads Mace-Baked goods fruit, vegetables, fish, poultry (taste  like nutmeg) Maple  Extract-Syrups Marjoram-Meat, chicken, fish, vegetables, breads, green salads (taste like Sage) Mint-Tea, lamb, sherbet, vegetables, desserts, carrots, cabbage Mustard, Dry or Seed-Cheese, eggs, meats, vegetables, poultry Nutmeg-Baked goods, fruit, chicken, eggs, vegetables, desserts Onion Powder-Meat, fish, poultry, vegetables, cheese, eggs, bread, rice salads (Do not use   Onion salt) Orange Extract-Desserts, baked goods Oregano-Pasta, eggs, cheese, onions, pork, lamb, fish, chicken, vegetables, green salads Paprika-Meat, fish, poultry, eggs, cheese, vegetables Parsley Flakes-Butter, vegetables, meat fish, poultry, eggs, bread, salads (certain forms may   Contain sodium Pepper-Meat fish, poultry, vegetables, eggs Peppermint Extract-Desserts, baked goods Poppy Seed-Eggs, bread, cheese, fruit dressings, baked goods, noodles, vegetables, cottage  [employer], poultry, meat, fish, cauliflower, turnips,eggs bread Saffron-Rice, bread, veal, chicken, fish, eggs Sage-Meat, fish, poultry, onions, eggplant, tomateos, pork, stews Savory-Eggs, salads, poultry, meat, rice, vegetables, soups, pork Tarragon-Meat, poultry, fish, eggs, butter, vegetables (licorice-like flavor)  Thyme-Meat, poultry, fish, eggs, vegetables, (clover-like flavor), sauces, soups Tumeric-Salads, butter, eggs, fish, rice, vegetables (saffron-like flavor) Vanilla Extract-Baked goods, candy Vinegar-Salads, vegetables, meat marinades Walnut Extract-baked goods, candy  2. Choose your Foods Wisely   The following is a list of foods to avoid which are high in sodium:  Meats-Avoid all smoked, canned, salt cured, dried and kosher meat and fish as well as Anchovies   [employer]ox [employer] meats:Bologna, Liverwurst, Pastrami Canned meat or fish  Marinated herring Caviar    Pepperoni Corned Beef   Pizza Dried chipped beef  Salami Frozen breaded fish or meat Salt pork Frankfurters or hot  dogs  Sardines Gefilte fish   Sausage Ham (boiled ham, Proscuitto Smoked butt    spiced ham)   Spam      TV Dinners Vegetables Canned vegetables (Regular) Relish Canned mushrooms  Sauerkraut Olives    Tomato juice Pickles  Bakery and Dessert Products Canned puddings  Cream pies Cheesecake   Decorated cakes Cookies  Beverages/Juices Tomato juice, regular  Gatorade   V-8 vegetable juice, regular  Breads and Cereals Biscuit mixes   Salted potato chips, corn chips, pretzels Bread stuffing mixes  Salted crackers and rolls Pancake and waffle mixes Self-rising flour  Seasonings Accent    Meat sauces Barbecue sauce  Meat tenderizer Catsup    Monosodium glutamate (MSG) Celery salt   Onion salt Chili sauce   Prepared mustard Garlic salt   Salt, seasoned salt, sea salt Gravy mixes   Soy sauce Horseradish   Steak sauce Ketchup   Tartar sauce Lite salt    Teriyaki sauce Marinade mixes   Worcestershire sauce  Others Baking powder   Cocoa and cocoa mixes Baking soda   Commercial casserole mixes Candy-caramels, chocolate  Dehydrated soups    Bars, fudge,nougats  Instant rice and pasta mixes Canned broth or soup  Maraschino cherries Cheese, aged and processed cheese and cheese spreads  Learning Assessment Quiz  Indicated T (for True) or F (for False) for each of the following statements:  1. _____ Fresh fruits and vegetables and unprocessed grains are generally low in sodium 2. _____ Water may contain a considerable amount of sodium, depending on the source 3. _____ You can always tell if a food is high in sodium by tasting it 4. _____ Certain laxatives my Jerney high in sodium and should [place] avoided unless prescribed   by a physician or pharmacist 5. _____ Salt substitutes may Nataki used freely by anyone on a sodium restricted diet 6. _____ Sodium is present in table salt, food additives and as a natural  component of   most foods 7. _____ Table salt is approximately 90%  sodium 8. _____ Limiting sodium intake may help prevent excess fluid accumulation in the body 9. _____ On a sodium-restricted diet, seasonings such as bouillon soy sauce, and    cooking wine should [place] used in place of table salt 10. _____ On an ingredient list, a product which lists monosodium glutamate as the first   ingredient is an appropriate food to include on a low sodium diet  Circle the best answer(s) to the following statements (Hint: there may Analeise more than one correct answer)  11. On a low-sodium diet, some acceptable snack items are:    A. Olives  F. Bean dip   K. Grapefruit juice    B. Salted Pretzels G. Commercial Popcorn   L. Canned peaches    C. Carrot Sticks  H. Bouillon   M. Unsalted nuts   D. [place] fries  I. Peanut butter crackers N. Salami   E. Sweet pickles J. Tomato Juice   O. Pizza  12.  Seasonings that may Arlean used freely on a reduced - sodium diet include   A. Lemon wedges F.Monosodium glutamate K. Celery seed    B.Soysauce   G. Pepper   L. Mustard powder   C. Sea salt  H. Cooking wine  M. Onion flakes   D. Vinegar  E. Prepared horseradish N. Salsa   E. Sage   J. Worcestershire sauce  O. Chutney    If you need a refill on your cardiac medications before your next appointment, please call your pharmacy.

## 2018-04-16 DIAGNOSIS — K589 Irritable bowel syndrome without diarrhea: Secondary | ICD-10-CM | POA: Diagnosis not present

## 2018-04-17 DIAGNOSIS — E785 Hyperlipidemia, unspecified: Secondary | ICD-10-CM | POA: Diagnosis not present

## 2018-04-17 DIAGNOSIS — I10 Essential (primary) hypertension: Secondary | ICD-10-CM | POA: Diagnosis not present

## 2018-05-05 DIAGNOSIS — I10 Essential (primary) hypertension: Secondary | ICD-10-CM | POA: Diagnosis not present

## 2018-05-05 DIAGNOSIS — E119 Type 2 diabetes mellitus without complications: Secondary | ICD-10-CM | POA: Diagnosis not present

## 2018-05-15 DIAGNOSIS — H2513 Age-related nuclear cataract, bilateral: Secondary | ICD-10-CM | POA: Diagnosis not present

## 2018-05-15 DIAGNOSIS — H25013 Cortical age-related cataract, bilateral: Secondary | ICD-10-CM | POA: Diagnosis not present

## 2018-05-15 DIAGNOSIS — H401211 Low-tension glaucoma, right eye, mild stage: Secondary | ICD-10-CM | POA: Diagnosis not present

## 2018-05-15 DIAGNOSIS — H401222 Low-tension glaucoma, left eye, moderate stage: Secondary | ICD-10-CM | POA: Diagnosis not present

## 2018-05-20 NOTE — Progress Notes (Signed)
Cardiology Office Note    Date:  05/21/2018   ID:  Danielle Richard, DOB 08/19/1950, MRN [id number]  PCP:  Pearson Grippe, MD  Cardiologist: Lance Muss, MD EPS: None  Chief Complaint  Patient presents with  . Follow-up    History of Present Illness:  Danielle Richard is a 67 y.o. female [name] with history of chronic chest pain felt to [place] musculoskeletal, dyspnea on exertion felt secondary to deconditioning.  Last saw Dr. Eldridge Dace 12/2016 complaining of atypical chest pain and stress test ordered.  Normal nuclear stress test 10/2017 2D echo 02/2017 normal LVEF 60 to 65% with grade 2 DD and mild to moderate AI.  She also has hypertension, HLD on atorvastatin and diabetes.   Patient saw Dr. Lucia Gaskins 02/12/2018 with worsening dizziness but she has had for the past 10 years.  She was not orthostatic in the office that day.  MRI was negative for acute findings.   When I saw the patient 03/11/2018 she was mildly orthostatic and had not been drinking much water.  I stopped her Maxide and start low-dose lisinopril for hypertension.  Also placed a 30-day monitor.  Saw her back 03/25/2018 and her dizziness had resolved.  She did not want to wear the monitor.  She did develop a cough on the lisinopril but has had a cough in the past felt secondary to allergies and was briefly on steroids by PCP.  I asked her to continue low-dose lisinopril since it was working well for her blood pressure and treat her allergies.    I saw the patient 04/15/2018 at which time blood pressure was up.  She stopped taking Allegra because it made her feel fatigued.  Claims need a low-salt diet but does use a lot of fish sauce and soy sauce in her cooking.  She did not want to change her medications.  I increased her lisinopril to 10 mg once daily and asked her to take Zyrtec or Claritin for cough and allergies.  Patient comes in today accompanied by her daughter and an interpreter. Saw her PCP because BP was running very high and cough  never got better.  They switched her back to triamterene HCTZ 37.5/25 mg and blood pressure is back to normal.  So far she has not had any dizziness or presyncope.  She still has a cough.  She will not take any allergy medicines because she said it makes her feel bad.   Past Medical History:  Diagnosis Date  . Aortic insufficiency    pt unaware of this  . Depression   . Diabetes mellitus, type II (HCC)   . Dizziness    chronic for 10 years and mild  . GERD (gastroesophageal reflux disease)   . H/O mammogram 2016   normal  . History of nuclear stress test 10/16/2017   negative for ischemia  . HLD (hyperlipidemia)   . Hx of colonoscopy 2015   repeat 5 years  . Hypertension   . Insomnia   . Personal history of colonic polyps   . PUD (peptic ulcer disease)     Past Surgical History:  Procedure Laterality Date  . COLONOSCOPY WITH PROPOFOL N/A 09/29/2013   Procedure: COLONOSCOPY WITH PROPOFOL;  Surgeon: Charolett Bumpers, MD;  Location: [facility];  Service: Endoscopy;  Laterality: N/A;  . ESOPHAGOGASTRODUODENOSCOPY (EGD) WITH PROPOFOL N/A 09/29/2013   Procedure: ESOPHAGOGASTRODUODENOSCOPY (EGD) WITH PROPOFOL;  Surgeon: Charolett Bumpers, MD;  Location: [facility];  Service: Endoscopy;  Laterality: N/A;  .  NO PAST SURGERIES      Current Medications: Current Meds  Medication Sig  . atorvastatin (LIPITOR) 40 MG tablet Take 1 tablet (40 mg total) by mouth daily.  . Blood Glucose Monitoring Suppl (TRUE METRIX METER) DEVI 1 each by Does not apply route 3 (three) times daily before meals.  . calcium-vitamin D (OSCAL WITH D) 500-200 MG-UNIT tablet Take 1 tablet by mouth daily with breakfast.  . citalopram (CELEXA) 20 MG tablet Take 1 tablet (20 mg total) by mouth every morning.  . dicyclomine (BENTYL) 20 MG tablet Take 20 mg by mouth 4 (four) times daily -  before meals and at bedtime.   . fexofenadine (ALLEGRA) 180 MG tablet Take 180 mg by mouth as needed.   Marland Kitchen glucose blood (TRUE  METRIX BLOOD GLUCOSE TEST) test strip Use 3 times daily before meals  . latanoprost (XALATAN) 0.005 % ophthalmic solution PLACE ONE DROP INTO BOTH EYES AT BEDTIME  . metFORMIN (GLUCOPHAGE) 500 MG tablet TAKE 1 TABLET (500 MG TOTAL) BY MOUTH 2 TIMES DAILY WITH A MEAL.  Marland Kitchen Omega-3 Fatty Acids (FISH OIL) 1200 MG CAPS Take 1,200 mg by mouth daily.  Marland Kitchen omeprazole (PRILOSEC) 40 MG capsule Take 1 capsule by mouth 2 (two) times daily.  Marland Kitchen PROLIA 60 MG/ML SOSY injection INJECT 60 MG Q 6 MONTHS  . sucralfate (CARAFATE) 1 g tablet Take 1 tablet by mouth. On an empty stomach three times daily before meals  . traZODone (DESYREL) 100 MG tablet Take 1 tablet (100 mg total) by mouth at bedtime.  . TRUEPLUS LANCETS 28G MISC 1 each by Does not apply route 3 (three) times daily before meals.     Allergies:   Tylenol [acetaminophen] and Aspirin   Social History   Socioeconomic History  . Marital status: Married    Spouse name: Not on file  . Number of children: 3  . Years of education: Not on file  . Highest education level: High school graduate  Occupational History  . Not on file  Social Needs  . Financial resource strain: Not on file  . Food insecurity:    Worry: Not on file    Inability: Not on file  . Transportation needs:    Medical: Not on file    Non-medical: Not on file  Tobacco Use  . Smoking status: Never Smoker  . Smokeless tobacco: Never Used  Substance and Sexual Activity  . Alcohol use: Not Currently    Alcohol/week: 2.0 standard drinks    Types: 1 Glasses of wine, 1 Cans of beer per week    Comment: socially  . Drug use: No  . Sexual activity: Not on file  Lifestyle  . Physical activity:    Days per week: Not on file    Minutes per session: Not on file  . Stress: Not on file  Relationships  . Social connections:    Talks on phone: Not on file    Gets together: Not on file    Attends religious service: Not on file    Active member of club or organization: Not on file     Attends meetings of clubs or organizations: Not on file    Relationship status: Not on file  Other Topics Concern  . Not on file  Social History Narrative   Lives with husband and child   Right handed     Family History:  The patient's family history includes Breast cancer (age of onset: 100) in her sister; CAD in  her mother; Cancer in her father; Diabetes type II in her brother; Heart disease in her father; High Cholesterol in her brother.   ROS:   Please see the history of present illness.    Review of Systems  Respiratory: Positive for cough.   Musculoskeletal: Positive for back pain.   All other systems reviewed and are negative.   PHYSICAL EXAM:   VS:  BP 124/72   Pulse 67   Ht 5\' 1"  (1.549 m)   Wt 106 lb 9.6 oz (48.4 kg)   SpO2 97%   BMI 20.14 kg/m   Physical Exam  GEN: Thin, in no acute distress  Neck: no JVD, carotid bruits, or masses Cardiac:RRR; no murmurs, rubs, or gallops  Respiratory:  clear to auscultation bilaterally, normal work of breathing GI: soft, nontender, nondistended, + BS Ext: without cyanosis, clubbing, or edema, Good distal pulses bilaterally Neuro:  Alert and Oriented x 3 Psych: euthymic mood, full affect  Wt Readings from Last 3 Encounters:  05/21/18 106 lb 9.6 oz (48.4 kg)  04/15/18 108 lb (49 kg)  03/25/18 110 lb 12.8 oz (50.3 kg)      Studies/Labs Reviewed:   EKG:  EKG is not ordered today.   Recent Labs: 03/25/2018: BUN 11; Creatinine, Ser 0.81; Potassium 4.0; Sodium 147   Lipid Panel    Component Value Date/Time   CHOL 133 07/11/2016 0942   TRIG 133 07/11/2016 0942   HDL 51 07/11/2016 0942   CHOLHDL 2.6 07/11/2016 0942   VLDL 27 07/11/2016 0942   LDLCALC 55 07/11/2016 0942    Additional studies/ records that were reviewed today include:  2D echo 02/18/2017 Study Conclusions   - Left ventricle: The cavity size was normal. Wall thickness was   normal. Systolic function was normal. The estimated ejection   fraction was in  the range of 60% to 65%. Wall motion was normal;   there were no regional wall motion abnormalities. Features are   consistent with a pseudonormal left ventricular filling pattern,   with concomitant abnormal relaxation and increased filling   pressure (grade 2 diastolic dysfunction). - Aortic valve: There was mild to moderate regurgitation.    ASSESSMENT:    1. Essential hypertension   2. Aortic valve insufficiency, etiology of cardiac valve disease unspecified   3. Mixed hyperlipidemia      PLAN:  In order of problems listed above:  Essential hypertension PCP stopped lisinopril 10 mg once daily because blood pressure was elevated and still complaining of cough.  They started triamterene HCTZ that she was on before.  Blood pressure is normal.  She is not dizzy.  She has had orthostasis on this drug in the past.  If this recurs may have to stop the HCTZ part of it.  Follow-up with Dr. Eldridge Dace in 4 months.  Recommend she follow-up with an allergist for cough.  Not convinced it was from lisinopril.  She has had this in the past.  Aortic insufficiency mild to moderate on echo 12/2016  Mixed hyperlipidemia on atorvastatin last LDL 63 on 01/09/2018   Medication Adjustments/Labs and Tests Ordered: Current medicines are reviewed at length with the patient today.  Concerns regarding medicines are outlined above.  Medication changes, Labs and Tests ordered today are listed in the Patient Instructions below. Patient Instructions  Medication Instructions:  Your physician recommends that you continue on your current medications as directed. Please refer to the Current Medication list given to you today.  If you need a refill  on your cardiac medications before your next appointment, please call your pharmacy.   Lab work: None Ordered  If you have labs (blood work) drawn today and your tests are completely normal, you will receive your results only by: Marland Kitchen MyChart Message (if you have MyChart)  OR . A paper copy in the mail If you have any lab test that is abnormal or we need to change your treatment, we will call you to review the results.  Testing/Procedures: None ordered  Follow-Up: . Follow up with Dr. Eldridge Dace on 10/05/17 at 9:20 AM  Any Other Special Instructions Will Syann Listed Below (If Applicable).  You can see an allergist for your cough     Signed, Jacolyn Reedy, PA-C  05/21/2018 9:58 AM    [facility] [address] [place], [place], [place]  [address] Phone: [phone]; Fax: [phone]

## 2018-05-21 ENCOUNTER — Ambulatory Visit (INDEPENDENT_AMBULATORY_CARE_PROVIDER_SITE_OTHER): Payer: Medicare Other | Admitting: Physician Assistant

## 2018-05-21 ENCOUNTER — Encounter: Payer: Self-pay | Admitting: Physician Assistant

## 2018-05-21 VITALS — BP 124/72 | HR 67 | Ht 61.0 in | Wt 106.6 lb

## 2018-05-21 DIAGNOSIS — E782 Mixed hyperlipidemia: Secondary | ICD-10-CM | POA: Diagnosis not present

## 2018-05-21 DIAGNOSIS — I351 Nonrheumatic aortic (valve) insufficiency: Secondary | ICD-10-CM | POA: Diagnosis not present

## 2018-05-21 DIAGNOSIS — I10 Essential (primary) hypertension: Secondary | ICD-10-CM | POA: Diagnosis not present

## 2018-05-21 NOTE — Patient Instructions (Signed)
Medication Instructions:  Your physician recommends that you continue on your current medications as directed. Please refer to the Current Medication list given to you today.  If you need a refill on your cardiac medications before your next appointment, please call your pharmacy.   Lab work: None Ordered  If you have labs (blood work) drawn today and your tests are completely normal, you will receive your results only by: Marland Kitchen MyChart Message (if you have MyChart) OR . A paper copy in the mail If you have any lab test that is abnormal or we need to change your treatment, we will call you to review the results.  Testing/Procedures: None ordered  Follow-Up: . Follow up with Dr. Eldridge Dace on 10/05/17 at 9:20 AM  Any Other Special Instructions Will [place] Listed Below (If Applicable).  You can see an allergist for your cough

## 2018-07-16 DIAGNOSIS — I10 Essential (primary) hypertension: Secondary | ICD-10-CM | POA: Diagnosis not present

## 2018-07-16 DIAGNOSIS — E119 Type 2 diabetes mellitus without complications: Secondary | ICD-10-CM | POA: Diagnosis not present

## 2018-07-23 DIAGNOSIS — E119 Type 2 diabetes mellitus without complications: Secondary | ICD-10-CM | POA: Diagnosis not present

## 2018-07-23 DIAGNOSIS — E785 Hyperlipidemia, unspecified: Secondary | ICD-10-CM | POA: Diagnosis not present

## 2018-07-23 DIAGNOSIS — K279 Peptic ulcer, site unspecified, unspecified as acute or chronic, without hemorrhage or perforation: Secondary | ICD-10-CM | POA: Diagnosis not present

## 2018-07-23 DIAGNOSIS — I10 Essential (primary) hypertension: Secondary | ICD-10-CM | POA: Diagnosis not present

## 2018-08-14 DIAGNOSIS — H401222 Low-tension glaucoma, left eye, moderate stage: Secondary | ICD-10-CM | POA: Diagnosis not present

## 2018-08-14 DIAGNOSIS — H169 Unspecified keratitis: Secondary | ICD-10-CM | POA: Diagnosis not present

## 2018-08-14 DIAGNOSIS — H401211 Low-tension glaucoma, right eye, mild stage: Secondary | ICD-10-CM | POA: Diagnosis not present

## 2018-10-06 ENCOUNTER — Ambulatory Visit: Payer: Medicare Other | Admitting: Interventional Cardiology

## 2018-10-08 ENCOUNTER — Other Ambulatory Visit: Payer: Self-pay | Admitting: Physician Assistant

## 2018-10-16 DIAGNOSIS — E785 Hyperlipidemia, unspecified: Secondary | ICD-10-CM | POA: Diagnosis not present

## 2018-10-16 DIAGNOSIS — I10 Essential (primary) hypertension: Secondary | ICD-10-CM | POA: Diagnosis not present

## 2018-10-16 DIAGNOSIS — E119 Type 2 diabetes mellitus without complications: Secondary | ICD-10-CM | POA: Diagnosis not present

## 2018-10-23 DIAGNOSIS — E119 Type 2 diabetes mellitus without complications: Secondary | ICD-10-CM | POA: Diagnosis not present

## 2018-10-23 DIAGNOSIS — I10 Essential (primary) hypertension: Secondary | ICD-10-CM | POA: Diagnosis not present

## 2018-10-23 DIAGNOSIS — E785 Hyperlipidemia, unspecified: Secondary | ICD-10-CM | POA: Diagnosis not present

## 2018-11-19 ENCOUNTER — Telehealth: Payer: Self-pay

## 2018-11-19 NOTE — Telephone Encounter (Signed)
Spoke with pt daughter Danielle Richard. She said she will [name] willing to help her mother do a phone visit.     Virtual Visit Pre-Appointment Phone Call  Steps For Call:  1. Confirm consent - "In the setting of the current Covid19 crisis, you are scheduled for a (phone or video) visit with your provider on (date) at (time).  Just as we do with many in-office visits, in order for you to participate in this visit, we must obtain consent.  If you'd like, I can send this to your mychart (if signed up) or email for you to review.  Otherwise, I can obtain your verbal consent now.  All virtual visits are billed to your insurance company just like a normal visit would Danielle Richard.  By agreeing to a virtual visit, we'd like you to understand that the technology does not allow for your provider to perform an examination, and thus may limit your provider's ability to fully assess your condition.  Finally, though the technology is pretty good, we cannot assure that it will always work on either your or our end, and in the setting of a video visit, we may have to convert it to a phone-only visit.  In either situation, we cannot ensure that we have a secure connection.  Are you willing to proceed?" STAFF: Did the patient verbally acknowledge consent to telehealth visit? yes  2. Confirm the BEST phone number to call the day of the visit by including in appointment notes  3. Give patient instructions for WebEx/MyChart download to smartphone as below or Doximity/Doxy.me if video visit (depending on what platform provider is using)  4. Advise patient to [name] prepared with their blood pressure, heart rate, weight, any heart rhythm information, their current medicines, and a piece of paper and pen handy for any instructions they may receive the day of their visit  5. Inform patient they will receive a phone call 15 minutes prior to their appointment time (may [name] from unknown caller ID) so they should [name] prepared to answer  6. Confirm that  appointment type is correct in Epic appointment notes (VIDEO vs PHONE)     TELEPHONE CALL NOTE  Danielle Richard has been deemed a candidate for a follow-up tele-health visit to limit community exposure during the Covid-19 pandemic. I spoke with the patient via phone to ensure availability of phone/video source, confirm preferred email & phone number, and discuss instructions and expectations.  I reminded Danielle Richard to Danielle Richard prepared with any vital sign and/or heart rhythm information that could potentially Danielle Richard obtained via home monitoring, at the time of her visit. I reminded Danielle Richard to expect a phone call at the time of her visit if her visit.  Clide DalesDanielle M Azelea Richard, CMA 11/19/2018 3:28 PM   INSTRUCTIONS FOR DOWNLOADING THE WEBEX APP TO SMARTPHONE  - If Apple, ask patient to go to [employer] Store and type in WebEx in the search bar. Download [employer]Webex Meetings, the blue/green circle. If Android, go to [employer] Play Store and type in [employer]WebEx in the search bar. The app is free but as with any other app downloads, their phone may require them to verify saved payment information or Apple/Android password.  - The patient does NOT have to create an account. - On the day of the visit, the assist will walk the patient through joining the meeting with the meeting number/password.  INSTRUCTIONS FOR DOWNLOADING THE MYCHART APP TO SMARTPHONE  - The patient must first make sure  to have activated MyChart and know their login information - If Apple, go to [employer] and type in MyChart in the search bar and download the app. If Android, ask patient to go to [employer] and type in Startup in the search bar and download the app. The app is free but as with any other app downloads, their phone may require them to verify saved payment information or Apple/Android password.  - The patient will need to then log into the app with their MyChart username and password, and select [place] as their healthcare provider to  link the account. When it is time for your visit, go to the MyChart app, find appointments, and click Begin Video Visit. Danielle Richard sure to Select Allow for your device to access the Microphone and Camera for your visit. You will then Danielle Richard connected, and your provider will Danielle Richard with you shortly.  **If they have any issues connecting, or need assistance please contact MyChart service desk [phone])**  **If using a computer, in order to ensure the best quality for their visit they will need to use either of the following Internet Browsers: [employer], or Google Chrome**  IF USING DOXIMITY or DOXY.ME - The patient will receive a link just prior to their visit, either by text or email (to Danielle Richard determined day of appointment depending on if it's doxy.me or Doximity).     FULL LENGTH CONSENT FOR TELE-HEALTH VISIT   I hereby voluntarily request, consent and authorize [facility] and its employed or contracted physicians, physician assistants, nurse practitioners or other licensed health care professionals (the Practitioner), to provide me with telemedicine health care services (the "Services") as deemed necessary by the treating Practitioner. I acknowledge and consent to receive the Services by the Practitioner via telemedicine. I understand that the telemedicine visit will involve communicating with the Practitioner through live audiovisual communication technology and the disclosure of certain medical information by electronic transmission. I acknowledge that I have been given the opportunity to request an in-person assessment or other available alternative prior to the telemedicine visit and am voluntarily participating in the telemedicine visit.  I understand that I have the right to withhold or withdraw my consent to the use of telemedicine in the course of my care at any time, without affecting my right to future care or treatment, and that the Practitioner or I may terminate the telemedicine  visit at any time. I understand that I have the right to inspect all information obtained and/or recorded in the course of the telemedicine visit and may receive copies of available information for a reasonable fee.  I understand that some of the potential risks of receiving the Services via telemedicine include:  Marland Kitchen Delay or interruption in medical evaluation due to technological equipment failure or disruption; . Information transmitted may not Lilyanna sufficient (e.g. poor resolution of images) to allow for appropriate medical decision making by the Practitioner; and/or  . In rare instances, security protocols could fail, causing a breach of personal health information.  Furthermore, I acknowledge that it is my responsibility to provide information about my medical history, conditions and care that is complete and accurate to the best of my ability. I acknowledge that Practitioner's advice, recommendations, and/or decision may Vyla based on factors not within their control, such as incomplete or inaccurate data provided by me or distortions of diagnostic images or specimens that may result from electronic transmissions. I understand that the practice of medicine is not an exact science  and that Practitioner makes no warranties or guarantees regarding treatment outcomes. I acknowledge that I will receive a copy of this consent concurrently upon execution via email to the email address I last provided but may also request a printed copy by calling the office of [place].    I understand that my insurance will Takeyah billed for this visit.   I have read or had this consent read to me. . I understand the contents of this consent, which adequately explains the benefits and risks of the Services being provided via telemedicine.  . I have been provided ample opportunity to ask questions regarding this consent and the Services and have had my questions answered to my satisfaction. . I give my informed consent for  the services to Larri provided through the use of telemedicine in my medical care  By participating in this telemedicine visit I agree to the above.

## 2018-11-26 NOTE — Progress Notes (Signed)
Virtual Visit via Telephone Note   This visit type was conducted due to national recommendations for restrictions regarding the COVID-19 Pandemic (e.g. social distancing) in an effort to limit this patient's exposure and mitigate transmission in our community.  Due to her co-morbid illnesses, this patient is at least at moderate risk for complications without adequate follow up.  This format is felt to [name] most appropriate for this patient at this time.  The patient did not have access to video technology/had technical difficulties with video requiring transitioning to audio format only (telephone).  All issues noted in this document were discussed and addressed.  No physical exam could Danielle Richard performed with this format.  Please refer to the patient's chart for her  consent [facility].   Evaluation Performed:  Follow-up visit  Date:  11/27/2018   ID:  Danielle Richard, DOB 1951-07-17, MRN [id number]  Patient Location: Home Provider Location: Home  PCP:  Pearson GrippeKim, James, MD  Cardiologist:  Lance MussJayadeep Varanasi, MD  Electrophysiologist:  None   Chief Complaint:  Chest discomfort  History of Present Illness:    Danielle Richard is a 68 y.o. female who has had chronic chest pain documented for the past 6-7 years, as of 12/17.  It was thought to [name] in nature.  3 way call with daughter who translated.   ETT in 6/18 was negative. Myoview in 3/19 showed:  Nuclear stress EF: 68%. The left ventricular ejection fraction is hyperdynamic (>65%).  The study is normal.  This is a low risk study.  There is no evidence of ischemia. Normal left ventricular systolic function.`  Echo in 2018 showed: Left ventricle: The cavity size was normal. Wall thickness was   normal. Systolic function was normal. The estimated ejection   fraction was in the range of 60% to 65%. Wall motion was normal;   there were no regional wall motion abnormalities. Features are   consistent with a pseudonormal left  ventricular filling pattern,   with concomitant abnormal relaxation and increased filling   pressure (grade 2 diastolic dysfunction). - Aortic valve: There was mild to moderate regurgitation.  The patient does not have symptoms concerning for COVID-19 infection (fever, chills, cough, or new shortness of breath).   Denies :  Dizziness. Leg edema. Nitroglycerin use. Orthopnea. Palpitations. Paroxysmal nocturnal dyspnea. Shortness of breath. Syncope.   Dizziness has improved after HCTZ was stopped.  Lisinopril was tried but she had cough and poorly controlled readings.  PMD restarted triamterene.   Uses stationary bike.  No cardiac sx.    Past Medical History:  Diagnosis Date  . Aortic insufficiency    pt unaware of this  . Depression   . Diabetes mellitus, type II (HCC)   . Dizziness    chronic for 10 years and mild  . GERD (gastroesophageal reflux disease)   . H/O mammogram 2016   normal  . History of nuclear stress test 10/16/2017   negative for ischemia  . HLD (hyperlipidemia)   . Hx of colonoscopy 2015   repeat 5 years  . Hypertension   . Insomnia   . Personal history of colonic polyps   . PUD (peptic ulcer disease)    Past Surgical History:  Procedure Laterality Date  . COLONOSCOPY WITH PROPOFOL N/A 09/29/2013   Procedure: COLONOSCOPY WITH PROPOFOL;  Surgeon: Charolett BumpersMartin K Johnson, MD;  Location: [facility];  Service: Endoscopy;  Laterality: N/A;  . ESOPHAGOGASTRODUODENOSCOPY (EGD) WITH PROPOFOL N/A 09/29/2013   Procedure:  ESOPHAGOGASTRODUODENOSCOPY (EGD) WITH PROPOFOL;  Surgeon: Charolett Bumpers, MD;  Location: [facility];  Service: Endoscopy;  Laterality: N/A;  . NO PAST SURGERIES       Current Meds  Medication Sig  . atorvastatin (LIPITOR) 40 MG tablet Take 1 tablet (40 mg total) by mouth daily.  . Blood Glucose Monitoring Suppl (TRUE METRIX METER) DEVI 1 each by Does not apply route 3 (three) times daily before meals.  . calcium-vitamin D (OSCAL WITH D) 500-200  MG-UNIT tablet Take 1 tablet by mouth daily with breakfast.  . citalopram (CELEXA) 20 MG tablet Take 1 tablet (20 mg total) by mouth every morning.  . dicyclomine (BENTYL) 20 MG tablet Take 20 mg by mouth 4 (four) times daily -  before meals and at bedtime.   . fexofenadine (ALLEGRA) 180 MG tablet Take 180 mg by mouth as needed.   Marland Kitchen glucose blood (TRUE METRIX BLOOD GLUCOSE TEST) test strip Use 3 times daily before meals  . latanoprost (XALATAN) 0.005 % ophthalmic solution PLACE ONE DROP INTO BOTH EYES AT BEDTIME  . metFORMIN (GLUCOPHAGE) 500 MG tablet TAKE 1 TABLET (500 MG TOTAL) BY MOUTH 2 TIMES DAILY WITH A MEAL.  Marland Kitchen Omega-3 Fatty Acids (FISH OIL) 1200 MG CAPS Take 1,200 mg by mouth daily.  Marland Kitchen omeprazole (PRILOSEC) 40 MG capsule Take 1 capsule by mouth 2 (two) times daily.  Marland Kitchen PROLIA 60 MG/ML SOSY injection INJECT 60 MG Q 6 MONTHS  . sucralfate (CARAFATE) 1 g tablet Take 1 tablet by mouth. On an empty stomach three times daily before meals  . traZODone (DESYREL) 100 MG tablet Take 1 tablet (100 mg total) by mouth at bedtime.  . triamterene (DYRENIUM) 50 MG capsule Take 50 mg by mouth daily.  . TRUEPLUS LANCETS 28G MISC 1 each by Does not apply route 3 (three) times daily before meals.     Allergies:   Tylenol [acetaminophen] and Aspirin   Social History   Tobacco Use  . Smoking status: Never Smoker  . Smokeless tobacco: Never Used  Substance Use Topics  . Alcohol use: Not Currently    Alcohol/week: 2.0 standard drinks    Types: 1 Glasses of wine, 1 Cans of beer per week    Comment: socially  . Drug use: No     Family Hx: The patient's family history includes Breast cancer (age of onset: 46) in her sister; CAD in her mother; Cancer in her father; Diabetes type II in her brother; Heart disease in her father; High Cholesterol in her brother.  ROS:   Please see the history of present illness.    Occasional chest pain All other systems reviewed and are negative.   Prior CV studies:    The following studies were reviewed today:  TC 119 in 10/2018, TG 114  Labs/Other Tests and Data Reviewed:    EKG:    Recent Labs: 03/25/2018: BUN 11; Creatinine, Ser 0.81; Potassium 4.0; Sodium 147   Recent Lipid Panel Lab Results  Component Value Date/Time   CHOL 133 07/11/2016 09:42 AM   TRIG 133 07/11/2016 09:42 AM   HDL 51 07/11/2016 09:42 AM   CHOLHDL 2.6 07/11/2016 09:42 AM   LDLCALC 55 07/11/2016 09:42 AM    Wt Readings from Last 3 Encounters:  11/27/18 104 lb (47.2 kg)  05/21/18 106 lb 9.6 oz (48.4 kg)  04/15/18 108 lb (49 kg)     Objective:    Vital Signs:  BP (!) 162/73   Ht  (1.549  m)   Wt 104 lb (47.2 kg)   BMI 19.65 kg/m    VITAL SIGNS:  reviewed GEN:  no acute distress No shortness of breath exam is limited by phone format  ASSESSMENT & PLAN:    1. Chest pain: Sx under control.  No exertional sx.  BP was high today.  It was controlled in the past. Taking triamterene.  She did not tolerate lisinopril due to cough.  COnsider ARB.  Check BP at home. 2. AI: No CHF sx.  3. DM: A1C 6.0 in 3/20. 4. Hyperlipidemia: The current medical regimen is effective;  continue present plan and medications. 5. Fatigue /DOE:  COntinue regular exercise.  Sx have improved compared to prior.  Dizziness better.   COVID-19 Education: The signs and symptoms of COVID-19 were discussed with the patient and how to seek care for testing (follow up with PCP or arrange E-visit).  The importance of social distancing was discussed today.  Time:   Today, I have spent 23 minutes with the patient with telehealth technology discussing the above problems.     Medication Adjustments/Labs and Tests Ordered: Current medicines are reviewed at length with the patient today.  Concerns regarding medicines are outlined above.   Tests Ordered: No orders of the defined types were placed in this encounter.   Medication Changes: No orders of the defined types were placed in this  encounter.   Disposition:  Follow up in 1 week(s)  Signed, Lance Muss, MD  11/27/2018 10:12 AM    [facility]

## 2018-11-27 ENCOUNTER — Telehealth (INDEPENDENT_AMBULATORY_CARE_PROVIDER_SITE_OTHER): Payer: Medicare Other | Admitting: Interventional Cardiology

## 2018-11-27 ENCOUNTER — Other Ambulatory Visit: Payer: Self-pay

## 2018-11-27 ENCOUNTER — Encounter: Payer: Self-pay | Admitting: Interventional Cardiology

## 2018-11-27 ENCOUNTER — Telehealth: Payer: Self-pay | Admitting: Interventional Cardiology

## 2018-11-27 VITALS — BP 162/73 | Ht 61.0 in | Wt 104.0 lb

## 2018-11-27 DIAGNOSIS — I10 Essential (primary) hypertension: Secondary | ICD-10-CM | POA: Diagnosis not present

## 2018-11-27 DIAGNOSIS — Z794 Long term (current) use of insulin: Secondary | ICD-10-CM

## 2018-11-27 DIAGNOSIS — E119 Type 2 diabetes mellitus without complications: Secondary | ICD-10-CM

## 2018-11-27 DIAGNOSIS — I351 Nonrheumatic aortic (valve) insufficiency: Secondary | ICD-10-CM

## 2018-11-27 DIAGNOSIS — R0789 Other chest pain: Secondary | ICD-10-CM | POA: Diagnosis not present

## 2018-11-27 DIAGNOSIS — E782 Mixed hyperlipidemia: Secondary | ICD-10-CM | POA: Diagnosis not present

## 2018-11-27 NOTE — Patient Instructions (Addendum)
Medication Instructions:  Your physician recommends that you continue on your current medications as directed. Please refer to the Current Medication list given to you today.  If you need a refill on your cardiac medications before your next appointment, please call your pharmacy.   Lab work: None Ordered  If you have labs (blood work) drawn today and your tests are completely normal, you will receive your results only by: Marland Kitchen MyChart Message (if you have MyChart) OR . A paper copy in the mail If you have any lab test that is abnormal or we need to change your treatment, we will call you to review the results.  Testing/Procedures: None ordered  Follow-Up: Follow up with Dr. Eldridge Dace via PHONE Visit for Blood Pressure check on 12/11/18 at 9:00 AM  Any Other Special Instructions Will Vondra Listed Below (If Applicable).

## 2018-11-27 NOTE — Telephone Encounter (Signed)
New Message:     Interpreter calling to see if you need her for pt's appointment?

## 2018-11-28 NOTE — Telephone Encounter (Signed)
Late entry:  Called interpreter back and made her aware patient's daughter was on the phone for the visit and interpreter services was not needed.

## 2018-12-11 ENCOUNTER — Encounter: Payer: Self-pay | Admitting: Interventional Cardiology

## 2018-12-11 ENCOUNTER — Telehealth: Payer: Self-pay | Admitting: Interventional Cardiology

## 2018-12-11 ENCOUNTER — Other Ambulatory Visit: Payer: Self-pay

## 2018-12-11 ENCOUNTER — Telehealth (INDEPENDENT_AMBULATORY_CARE_PROVIDER_SITE_OTHER): Payer: Medicare Other | Admitting: Interventional Cardiology

## 2018-12-11 VITALS — BP 145/74 | HR 64 | Ht 61.0 in | Wt 105.0 lb

## 2018-12-11 DIAGNOSIS — I10 Essential (primary) hypertension: Secondary | ICD-10-CM

## 2018-12-11 DIAGNOSIS — R0789 Other chest pain: Secondary | ICD-10-CM

## 2018-12-11 DIAGNOSIS — Z794 Long term (current) use of insulin: Secondary | ICD-10-CM

## 2018-12-11 DIAGNOSIS — E119 Type 2 diabetes mellitus without complications: Secondary | ICD-10-CM

## 2018-12-11 MED ORDER — LOSARTAN POTASSIUM 25 MG PO TABS: 25.0000 mg | ORAL_TABLET | Freq: Every day | ORAL | 3 refills | Status: DC

## 2018-12-11 NOTE — Progress Notes (Signed)
Virtual Visit via Telephone Note   This visit type was conducted due to national recommendations for restrictions regarding the COVID-19 Pandemic (e.g. social distancing) in an effort to limit this patient's exposure and mitigate transmission in our community.  Due to her co-morbid illnesses, this patient is at least at moderate risk for complications without adequate follow up.  This format is felt to Danielle Richard most appropriate for this patient at this time.  The patient did not have access to video technology/had technical difficulties with video requiring transitioning to audio format only (telephone).  All issues noted in this document were discussed and addressed.  No physical exam could Danielle Richard performed with this format.  Please refer to the patient's chart for her  consent to telehealth for [facility].   Date:  12/11/2018   ID:  Danielle Richard, DOB 1950/10/29, MRN [id number]  Patient Location: Home Provider Location: Home  PCP:  Pearson Grippe, MD  Cardiologist:  Lance Muss, MD  Electrophysiologist:  None   Evaluation Performed:  Follow-Up Visit  Chief Complaint:  Increased BP/HTN  History of Present Illness:    Danielle Richard is a 68 y.o. female with chronic chest pain documented for the past 6-7 years, as of 12/17. It was thought to [place] MSK in nature.  3 way call with daughter who translated.   ETT in 6/18 was negative. Myoview in 3/19 showed:  Nuclear stress EF: 68%. The left ventricular ejection fraction is hyperdynamic (>65%).  The study is normal.  This is a low risk study.  There is no evidence of ischemia. Normal left ventricular systolic function.`  Echo in 2018 showed: Left ventricle: The cavity size was normal. Wall thickness was normal. Systolic function was normal. The estimated ejection fraction was in the range of 60% to 65%. Wall motion was normal; there were no regional wall motion abnormalities. Features are consistent with a pseudonormal left  ventricular filling pattern, with concomitant abnormal relaxation and increased filling pressure (grade 2 diastolic dysfunction). - Aortic valve: There was mild to moderate regurgitation.  In early 2020, Dizziness has improved after HCTZ was stopped.  Lisinopril was tried but she had cough and poorly controlled readings.  PMD restarted triamterene.   Denies : Chest pain. Dizziness. Leg edema. Nitroglycerin use. Orthopnea. Palpitations. Paroxysmal nocturnal dyspnea. Shortness of breath. Syncope.   Still using stationary bike.   BP at home 130-150 systolic.  THis AM 146/74.  Daughter is concerned that she does not limit salt intake.   The patient does not have symptoms concerning for COVID-19 infection (fever, chills, cough, or new shortness of breath).    Past Medical History:  Diagnosis Date  . Aortic insufficiency    pt unaware of this  . Depression   . Diabetes mellitus, type II (HCC)   . Dizziness    chronic for 10 years and mild  . GERD (gastroesophageal reflux disease)   . H/O mammogram 2016   normal  . History of nuclear stress test 10/16/2017   negative for ischemia  . HLD (hyperlipidemia)   . Hx of colonoscopy 2015   repeat 5 years  . Hypertension   . Insomnia   . Personal history of colonic polyps   . PUD (peptic ulcer disease)    Past Surgical History:  Procedure Laterality Date  . COLONOSCOPY WITH PROPOFOL N/A 09/29/2013   Procedure: COLONOSCOPY WITH PROPOFOL;  Surgeon: Charolett Bumpers, MD;  Location: [facility];  Service: Endoscopy;  Laterality: N/A;  . ESOPHAGOGASTRODUODENOSCOPY (  EGD) WITH PROPOFOL N/A 09/29/2013   Procedure: ESOPHAGOGASTRODUODENOSCOPY (EGD) WITH PROPOFOL;  Surgeon: Charolett Bumpers, MD;  Location: [facility];  Service: Endoscopy;  Laterality: N/A;  . NO PAST SURGERIES       Current Meds  Medication Sig  . atorvastatin (LIPITOR) 40 MG tablet Take 1 tablet (40 mg total) by mouth daily.  . Blood Glucose Monitoring Suppl (TRUE  METRIX METER) DEVI 1 each by Does not apply route 3 (three) times daily before meals.  . calcium-vitamin D (OSCAL WITH D) 500-200 MG-UNIT tablet Take 1 tablet by mouth daily with breakfast.  . citalopram (CELEXA) 20 MG tablet Take 1 tablet (20 mg total) by mouth every morning.  . dicyclomine (BENTYL) 20 MG tablet Take 20 mg by mouth 4 (four) times daily -  before meals and at bedtime.   . fexofenadine (ALLEGRA) 180 MG tablet Take 180 mg by mouth as needed.   Marland Kitchen glucose blood (TRUE METRIX BLOOD GLUCOSE TEST) test strip Use 3 times daily before meals  . latanoprost (XALATAN) 0.005 % ophthalmic solution PLACE ONE DROP INTO BOTH EYES AT BEDTIME  . metFORMIN (GLUCOPHAGE) 500 MG tablet TAKE 1 TABLET (500 MG TOTAL) BY MOUTH 2 TIMES DAILY WITH A MEAL.  Marland Kitchen Omega-3 Fatty Acids (FISH OIL) 1200 MG CAPS Take 1,200 mg by mouth daily.  Marland Kitchen omeprazole (PRILOSEC) 40 MG capsule Take 1 capsule by mouth 2 (two) times daily.  Marland Kitchen PROLIA 60 MG/ML SOSY injection INJECT 60 MG Q 6 MONTHS  . sucralfate (CARAFATE) 1 g tablet Take 1 tablet by mouth. On an empty stomach three times daily before meals  . traZODone (DESYREL) 100 MG tablet Take 1 tablet (100 mg total) by mouth at bedtime.  . triamterene (DYRENIUM) 50 MG capsule Take 50 mg by mouth daily.  . TRUEPLUS LANCETS 28G MISC 1 each by Does not apply route 3 (three) times daily before meals.     Allergies:   Tylenol [acetaminophen] and Aspirin   Social History   Tobacco Use  . Smoking status: Never Smoker  . Smokeless tobacco: Never Used  Substance Use Topics  . Alcohol use: Not Currently    Alcohol/week: 2.0 standard drinks    Types: 1 Glasses of wine, 1 Cans of beer per week    Comment: socially  . Drug use: No     Family Hx: The patient's family history includes Breast cancer (age of onset: 68) in her sister; CAD in her mother; Cancer in her father; Diabetes type II in her brother; Heart disease in her father; High Cholesterol in her brother.  ROS:   Please  see the history of present illness.    Increased BP readings All other systems reviewed and are negative.   Prior CV studies:   The following studies were reviewed today:  Prior potassium well controlled.  Labs/Other Tests and Data Reviewed:    EKG:  No ECG reviewed.  Recent Labs: 03/25/2018: BUN 11; Creatinine, Ser 0.81; Potassium 4.0; Sodium 147   Recent Lipid Panel Lab Results  Component Value Date/Time   CHOL 133 07/11/2016 09:42 AM   TRIG 133 07/11/2016 09:42 AM   HDL 51 07/11/2016 09:42 AM   CHOLHDL 2.6 07/11/2016 09:42 AM   LDLCALC 55 07/11/2016 09:42 AM    Wt Readings from Last 3 Encounters:  12/11/18 105 lb (47.6 kg)  11/27/18 104 lb (47.2 kg)  05/21/18 106 lb 9.6 oz (48.4 kg)     Objective:    Vital Signs:  BP Marland Kitchen)  145/74   Pulse 64   Ht 5\' 1"  (1.549 m)   Wt 105 lb (47.6 kg)   BMI 19.84 kg/m    VITAL SIGNS:  reviewed GEN:  no acute distress RESPIRATORY:  no shortness of breath PSYCH:  difficult to assess due to language barrier exam limited by phone format  ASSESSMENT & PLAN:    1. Chest pain: Resolved.  Continue regular exercise and secondary prevention. 2. HTN: BP still high.  Will stop triamterene and start Losartan 25 mg daily.  Want to avoid two drugs that can raise potassium.  BMet in 1 week.  F/u BP check in 1-2 weeks.  She will check at home and keep list of readings.   3. DM: WOuld like to see BP < 130/80.    COVID-19 Education: The signs and symptoms of COVID-19 were discussed with the patient and how to seek care for testing (follow up with PCP or arrange E-visit).  The importance of social distancing was discussed today.  Time:   Today, I have spent  minutes with the patient with telehealth technology discussing the above problems.     Medication Adjustments/Labs and Tests Ordered: Current medicines are reviewed at length with the patient today.  Concerns regarding medicines are outlined above.   Tests Ordered: No orders of the  defined types were placed in this encounter.   Medication Changes: No orders of the defined types were placed in this encounter.   Disposition:  Follow up in 2 week(s)  Signed, Lance MussJayadeep Diannie Willner, MD  12/11/2018 9:06 AM    [facility]

## 2018-12-11 NOTE — Patient Instructions (Addendum)
Medication Instructions:  Your physician has recommended you make the following change in your medication:   1. STOP: triamterene  2. START: losartan 25 mg tablet: Take 1 tablet by mouth once a day   Lab work: Your physician recommends that you return for lab work (BMET) on 12/17/18   If you have labs (blood work) drawn today and your tests are completely normal, you will receive your results only by: Marland Kitchen MyChart Message (if you have MyChart) OR . A paper copy in the mail If you have any lab test that is abnormal or we need to change your treatment, we will call you to review the results.  Testing/Procedures: None ordered  Follow-Up: . Follow up with Dr. Eldridge Dace via TELEPHONE Visit on 12/25/18 at 2:40 PM  Any Other Special Instructions Will Revonda Listed Below (If Applicable).

## 2018-12-11 NOTE — Telephone Encounter (Signed)
Interpreter was not needed for the visit.

## 2018-12-11 NOTE — Telephone Encounter (Signed)
New Message           A interpreter Mardee Postin) is calling for this patient her # is [phone]. She would like for someone to 3 way the call so that she can help with the Virtual visit.

## 2018-12-17 ENCOUNTER — Other Ambulatory Visit: Payer: Medicare Other | Admitting: *Deleted

## 2018-12-17 DIAGNOSIS — I10 Essential (primary) hypertension: Secondary | ICD-10-CM

## 2018-12-17 LAB — BASIC METABOLIC PANEL
BUN/Creatinine Ratio: 15 (ref 12–28)
BUN: 11 mg/dL (ref 8–27)
CO2: 24 mmol/L (ref 20–29)
Calcium: 8.4 mg/dL — ABNORMAL LOW (ref 8.7–10.3)
Chloride: 104 mmol/L (ref 96–106)
Creatinine, Ser: 0.75 mg/dL (ref 0.57–1.00)
GFR calc Af Amer: 95 mL/min/{1.73_m2} (ref >59)
GFR calc non Af Amer: 83 mL/min/{1.73_m2} (ref >59)
Glucose: 98 mg/dL (ref 65–99)
Potassium: 3.9 mmol/L (ref 3.5–5.2)
Sodium: 145 mmol/L — ABNORMAL HIGH (ref 134–144)

## 2018-12-18 ENCOUNTER — Telehealth: Payer: Self-pay | Admitting: Interventional Cardiology

## 2018-12-18 NOTE — Telephone Encounter (Signed)
The patient has been notified of the result and verbalized understanding.  All questions (if any) were answered. Lattie Haw, RN 12/18/2018 12:58 PM

## 2018-12-18 NOTE — Telephone Encounter (Signed)
-----   Message from Corky Crafts, MD sent at 12/17/2018  5:24 PM EDT ----- Normal renal function. Continue ARB

## 2018-12-18 NOTE — Telephone Encounter (Signed)
New message   Patient's daughter is returning call for lab results. Please call. 

## 2018-12-25 ENCOUNTER — Encounter: Payer: Self-pay | Admitting: Interventional Cardiology

## 2018-12-25 ENCOUNTER — Telehealth (INDEPENDENT_AMBULATORY_CARE_PROVIDER_SITE_OTHER): Payer: Medicare Other | Admitting: Interventional Cardiology

## 2018-12-25 ENCOUNTER — Other Ambulatory Visit: Payer: Self-pay

## 2018-12-25 DIAGNOSIS — I10 Essential (primary) hypertension: Secondary | ICD-10-CM

## 2018-12-25 MED ORDER — LORATADINE 10 MG PO TABS: 10.0000 mg | ORAL_TABLET | Freq: Every day | ORAL | 3 refills | Status: DC

## 2018-12-25 NOTE — Progress Notes (Signed)
Virtual Visit via Telephone Note   This visit type was conducted due to national recommendations for restrictions regarding the COVID-19 Pandemic (e.g. social distancing) in an effort to limit this patient's exposure and mitigate transmission in our community.  Due to her co-morbid illnesses, this patient is at least at moderate risk for complications without adequate follow up.  This format is felt to [name] most appropriate for this patient at this time.  The patient did not have access to video technology/had technical difficulties with video requiring transitioning to audio format only (telephone).  All issues noted in this document were discussed and addressed.  No physical exam could [name] performed with this format.  Please refer to the patient's chart for her  consent to telehealth for [facility].   Date:  12/25/2018   ID:  Danielle Richard, DOB 1950-12-26, MRN [id number]  Patient Location: Home Provider Location: Home  PCP:  Pearson Grippe, MD  Cardiologist:  Lance Muss, MD  Electrophysiologist:  None   Evaluation Performed:  Follow-Up Visit  Chief Complaint:  CAD  History of Present Illness:    Danielle Richard is a 68 y.o. female with  with chronic chest pain documented for the past 6-7 years, as of 12/17. It was thought to [place] MSK in nature.  3 way call with daughter who translated.  ETT in 6/18 was negative. Myoview in 3/19 showed:  Nuclear stress EF: 68%. The left ventricular ejection fraction is hyperdynamic (>65%).  The study is normal.  This is a low risk study.  There is no evidence of ischemia. Normal left ventricular systolic function.`  Echo in 2018 showed: Left ventricle: The cavity size was normal. Wall thickness was normal. Systolic function was normal. The estimated ejection fraction was in the range of 60% to 65%. Wall motion was normal; there were no regional wall motion abnormalities. Features are consistent with a pseudonormal left ventricular  filling pattern, with concomitant abnormal relaxation and increased filling pressure (grade 2 diastolic dysfunction). - Aortic valve: There was mild to moderate regurgitation.  In early 2020, Dizziness has improved after HCTZ was stopped. Lisinopril was tried but she had cough and poorly controlled readings. PMD restarted triamterene.     At last visit, we stopped triamterene and start Losartan 25 mg daily.  Want to avoid two drugs that can raise potassium.   Since that change, her BP has been in the 120s-135 systolic range.    Denies : Chest pain. Dizziness. Leg edema. Nitroglycerin use. Orthopnea. Palpitations. Paroxysmal nocturnal dyspnea.  Syncope.   Some SHOB at end of day.  Not affecting exertion.  The patient does not have symptoms concerning for COVID-19 infection (fever, chills, cough, or new shortness of breath).    Past Medical History:  Diagnosis Date  . Aortic insufficiency    pt unaware of this  . Depression   . Diabetes mellitus, type II (HCC)   . Dizziness    chronic for 10 years and mild  . GERD (gastroesophageal reflux disease)   . H/O mammogram 2016   normal  . History of nuclear stress test 10/16/2017   negative for ischemia  . HLD (hyperlipidemia)   . Hx of colonoscopy 2015   repeat 5 years  . Hypertension   . Insomnia   . Personal history of colonic polyps   . PUD (peptic ulcer disease)    Past Surgical History:  Procedure Laterality Date  . COLONOSCOPY WITH PROPOFOL N/A 09/29/2013   Procedure: COLONOSCOPY WITH  PROPOFOL;  Surgeon: Charolett BumpersMartin K Johnson, MD;  [facility];  Service: Endoscopy;  Laterality: N/A;  . ESOPHAGOGASTRODUODENOSCOPY (EGD) WITH PROPOFOL N/A 09/29/2013   Procedure: ESOPHAGOGASTRODUODENOSCOPY (EGD) WITH PROPOFOL;  Surgeon: Charolett BumpersMartin K Johnson, MD;  Location: [facility];  Service: Endoscopy;  Laterality: N/A;  . NO PAST SURGERIES       Current Meds  Medication Sig  . atorvastatin (LIPITOR) 40 MG tablet Take 1 tablet  (40 mg total) by mouth daily.  . Blood Glucose Monitoring Suppl (TRUE METRIX METER) DEVI 1 each by Does not apply route 3 (three) times daily before meals.  . calcium-vitamin D (OSCAL WITH D) 500-200 MG-UNIT tablet Take 1 tablet by mouth daily with breakfast.  . citalopram (CELEXA) 20 MG tablet Take 1 tablet (20 mg total) by mouth every morning.  . dicyclomine (BENTYL) 20 MG tablet Take 20 mg by mouth 4 (four) times daily -  before meals and at bedtime.   . fexofenadine (ALLEGRA) 180 MG tablet Take 180 mg by mouth as needed.   Marland Kitchen. glucose blood (TRUE METRIX BLOOD GLUCOSE TEST) test strip Use 3 times daily before meals  . latanoprost (XALATAN) 0.005 % ophthalmic solution PLACE ONE DROP INTO BOTH EYES AT BEDTIME  . losartan (COZAAR) 25 MG tablet Take 1 tablet (25 mg total) by mouth daily.  . metFORMIN (GLUCOPHAGE) 500 MG tablet TAKE 1 TABLET (500 MG TOTAL) BY MOUTH 2 TIMES DAILY WITH A MEAL.  Marland Kitchen. Omega-3 Fatty Acids (FISH OIL) 1200 MG CAPS Take 1,200 mg by mouth daily.  Marland Kitchen. omeprazole (PRILOSEC) 40 MG capsule Take 1 capsule by mouth 2 (two) times daily.  Marland Kitchen. PROLIA 60 MG/ML SOSY injection INJECT 60 MG Q 6 MONTHS  . sucralfate (CARAFATE) 1 g tablet Take 1 tablet by mouth. On an empty stomach three times daily before meals  . traZODone (DESYREL) 100 MG tablet Take 1 tablet (100 mg total) by mouth at bedtime.  . TRUEPLUS LANCETS 28G MISC 1 each by Does not apply route 3 (three) times daily before meals.     Allergies:   Tylenol [acetaminophen] and Aspirin   Social History   Tobacco Use  . Smoking status: Never Smoker  . Smokeless tobacco: Never Used  Substance Use Topics  . Alcohol use: Not Currently    Alcohol/week: 2.0 standard drinks    Types: 1 Glasses of wine, 1 Cans of beer per week    Comment: socially  . Drug use: No     Family Hx: The patient's family history includes Breast cancer (age of onset: 4547) in her sister; CAD in her mother; Cancer in her father; Diabetes type II in her  brother; Heart disease in her father; High Cholesterol in her brother.  ROS:   Please see the history of present illness.    Tolerating meds well All other systems reviewed and are negative.   Prior CV studies:   The following studies were reviewed today:  As above  Labs/Other Tests and Data Reviewed:    EKG:  No ECG reviewed.  Recent Labs: 12/17/2018: BUN 11; Creatinine, Ser 0.75; Potassium 3.9; Sodium 145   Recent Lipid Panel Lab Results  Component Value Date/Time   CHOL 133 07/11/2016 09:42 AM   TRIG 133 07/11/2016 09:42 AM   HDL 51 07/11/2016 09:42 AM   CHOLHDL 2.6 07/11/2016 09:42 AM   LDLCALC 55 07/11/2016 09:42 AM    Wt Readings from Last 3 Encounters:  12/25/18 102 lb (46.3 kg)  12/11/18 105 lb (47.6  kg)  11/27/18 104 lb (47.2 kg)     Objective:    Vital Signs:  BP 132/72   Ht  (1.549 m)   Wt 102 lb (46.3 kg)   BMI 19.27 kg/m    VITAL SIGNS:  reviewed GEN:  no acute distress RESPIRATORY:  no shortness of breath PSYCH:  unable to assess affect due to language barrier exam limited due to phone format  ASSESSMENT & PLAN:    1. HTN: COntinue current meds.  Well controlled. She needs to work on minimizing salt intake.  2. CP: Resolved.   3. DM: Target BP < 130/80.  4. Describes some shortness of breath, only at the end of the day.  Triameterene was stopped and no edema reported.  If this persists, could add low dose diuretic ( furosemide).  Daughtrer states that she has had this SHOB prior to losartan so it is unlikely to Cyara a side effect of the medicine.  5. OK to try claritin over the counter for itchiness and allergy sx.  She did not tolerate fexofenadine in the past.   She has used prednisone for this in the past. I do not think it is related to loasartan.   COVID-19 Education: The signs and symptoms of COVID-19 were discussed with the patient and how to seek care for testing (follow up with PCP or arrange E-visit).  The importance of social  distancing was discussed today.  Time:   Today, I have spent 17 minutes with the patient with telehealth technology discussing the above problems.     Medication Adjustments/Labs and Tests Ordered: Current medicines are reviewed at length with the patient today.  Concerns regarding medicines are outlined above.   Tests Ordered: No orders of the defined types were placed in this encounter.   Medication Changes: No orders of the defined types were placed in this encounter.   Disposition:  Follow up in 6 month(s)  Signed, Lance Muss, MD  12/25/2018 3:20 PM    [facility]

## 2018-12-25 NOTE — Patient Instructions (Signed)
Medication Instructions:  Your physician has recommended you make the following change in your medication:   Take Over the Counter Claritin 10 mg once a day  Lab work: None Ordered  If you have labs (blood work) drawn today and your tests are completely normal, you will receive your results only by: Marland Kitchen MyChart Message (if you have MyChart) OR . A paper copy in the mail If you have any lab test that is abnormal or we need to change your treatment, we will call you to review the results.  Testing/Procedures: None ordered  Follow-Up: At [facility], you and your health needs are our priority.  As part of our continuing mission to provide you with exceptional heart care, we have created designated Provider Care Teams.  These Care Teams include your primary Cardiologist (physician) and Advanced Practice Providers (APPs -  Physician Assistants and Nurse Practitioners) who all work together to provide you with the care you need, when you need it. . You will need a follow up appointment in 6 months.  Please call our office 2 months in advance to schedule this appointment.  You may see Everette Rank, MD or one of the following Advanced Practice Providers on your designated Care Team:   . Robbie Lis, PA-C . Dayna Dunn, PA-C . Jacolyn Reedy, PA-C  Any Other Special Instructions Will Edyn Listed Below (If Applicable).

## 2019-01-05 DIAGNOSIS — R0600 Dyspnea, unspecified: Secondary | ICD-10-CM | POA: Diagnosis not present

## 2019-01-05 DIAGNOSIS — J984 Other disorders of lung: Secondary | ICD-10-CM | POA: Diagnosis not present

## 2019-01-05 DIAGNOSIS — K279 Peptic ulcer, site unspecified, unspecified as acute or chronic, without hemorrhage or perforation: Secondary | ICD-10-CM | POA: Diagnosis not present

## 2019-01-05 DIAGNOSIS — E119 Type 2 diabetes mellitus without complications: Secondary | ICD-10-CM | POA: Diagnosis not present

## 2019-01-05 DIAGNOSIS — I10 Essential (primary) hypertension: Secondary | ICD-10-CM | POA: Diagnosis not present

## 2019-01-05 DIAGNOSIS — E785 Hyperlipidemia, unspecified: Secondary | ICD-10-CM | POA: Diagnosis not present

## 2019-01-19 ENCOUNTER — Other Ambulatory Visit: Payer: Self-pay

## 2019-01-19 NOTE — Patient Outreach (Signed)
[place] [place] [facility]) Care Management  01/19/2019  Danielle Richard 1951/07/08 [id number]   Medication Adherence call to Mrs. Danielle Richard Telephone call to Patient regarding Medication Adherence unable to reach patient.[name] is showing past due on Atorvastatin 40 mg under [place].   [facility] Management Direct Dial [phone]  Fax [phone] [email or web address]

## 2019-01-27 DIAGNOSIS — H43393 Other vitreous opacities, bilateral: Secondary | ICD-10-CM | POA: Diagnosis not present

## 2019-01-27 DIAGNOSIS — H401211 Low-tension glaucoma, right eye, mild stage: Secondary | ICD-10-CM | POA: Diagnosis not present

## 2019-01-27 DIAGNOSIS — H401222 Low-tension glaucoma, left eye, moderate stage: Secondary | ICD-10-CM | POA: Diagnosis not present

## 2019-02-09 DIAGNOSIS — R0600 Dyspnea, unspecified: Secondary | ICD-10-CM | POA: Diagnosis not present

## 2019-02-09 DIAGNOSIS — R918 Other nonspecific abnormal finding of lung field: Secondary | ICD-10-CM | POA: Diagnosis not present

## 2019-02-10 DIAGNOSIS — K219 Gastro-esophageal reflux disease without esophagitis: Secondary | ICD-10-CM | POA: Diagnosis not present

## 2019-02-10 DIAGNOSIS — E785 Hyperlipidemia, unspecified: Secondary | ICD-10-CM | POA: Diagnosis not present

## 2019-02-10 DIAGNOSIS — E119 Type 2 diabetes mellitus without complications: Secondary | ICD-10-CM | POA: Diagnosis not present

## 2019-02-10 DIAGNOSIS — I10 Essential (primary) hypertension: Secondary | ICD-10-CM | POA: Diagnosis not present

## 2019-03-10 DIAGNOSIS — E785 Hyperlipidemia, unspecified: Secondary | ICD-10-CM | POA: Diagnosis not present

## 2019-03-10 DIAGNOSIS — E119 Type 2 diabetes mellitus without complications: Secondary | ICD-10-CM | POA: Diagnosis not present

## 2019-03-10 DIAGNOSIS — K219 Gastro-esophageal reflux disease without esophagitis: Secondary | ICD-10-CM | POA: Diagnosis not present

## 2019-03-10 DIAGNOSIS — I10 Essential (primary) hypertension: Secondary | ICD-10-CM | POA: Diagnosis not present

## 2019-03-18 ENCOUNTER — Other Ambulatory Visit: Payer: Self-pay | Admitting: Physician Assistant

## 2019-03-31 DIAGNOSIS — I10 Essential (primary) hypertension: Secondary | ICD-10-CM | POA: Diagnosis not present

## 2019-03-31 DIAGNOSIS — E119 Type 2 diabetes mellitus without complications: Secondary | ICD-10-CM | POA: Diagnosis not present

## 2019-04-07 DIAGNOSIS — M810 Age-related osteoporosis without current pathological fracture: Secondary | ICD-10-CM | POA: Diagnosis not present

## 2019-04-07 DIAGNOSIS — Z23 Encounter for immunization: Secondary | ICD-10-CM | POA: Diagnosis not present

## 2019-04-07 DIAGNOSIS — E119 Type 2 diabetes mellitus without complications: Secondary | ICD-10-CM | POA: Diagnosis not present

## 2019-04-07 DIAGNOSIS — I351 Nonrheumatic aortic (valve) insufficiency: Secondary | ICD-10-CM | POA: Diagnosis not present

## 2019-04-07 DIAGNOSIS — R42 Dizziness and giddiness: Secondary | ICD-10-CM | POA: Diagnosis not present

## 2019-04-07 DIAGNOSIS — R0600 Dyspnea, unspecified: Secondary | ICD-10-CM | POA: Diagnosis not present

## 2019-04-09 ENCOUNTER — Other Ambulatory Visit (HOSPITAL_COMMUNITY): Payer: Self-pay | Admitting: Internal Medicine

## 2019-04-09 DIAGNOSIS — I351 Nonrheumatic aortic (valve) insufficiency: Secondary | ICD-10-CM

## 2019-04-21 ENCOUNTER — Other Ambulatory Visit (HOSPITAL_COMMUNITY): Payer: Self-pay | Admitting: Internal Medicine

## 2019-04-21 DIAGNOSIS — R42 Dizziness and giddiness: Secondary | ICD-10-CM

## 2019-04-23 ENCOUNTER — Other Ambulatory Visit: Payer: Self-pay

## 2019-04-23 ENCOUNTER — Ambulatory Visit (HOSPITAL_COMMUNITY)
Admission: RE | Admit: 2019-04-23 | Discharge: 2019-04-23 | Disposition: A | Discharge status: Home / Self Care | Payer: Medicare Other | Source: Ambulatory Visit | Attending: Cardiology | Admitting: Cardiology

## 2019-04-23 DIAGNOSIS — R42 Dizziness and giddiness: Secondary | ICD-10-CM | POA: Insufficient documentation

## 2019-04-27 ENCOUNTER — Other Ambulatory Visit: Payer: Self-pay

## 2019-04-27 ENCOUNTER — Ambulatory Visit (HOSPITAL_COMMUNITY): Payer: Medicare Other | Attending: Internal Medicine

## 2019-04-27 DIAGNOSIS — I351 Nonrheumatic aortic (valve) insufficiency: Secondary | ICD-10-CM | POA: Insufficient documentation

## 2019-05-12 DIAGNOSIS — E785 Hyperlipidemia, unspecified: Secondary | ICD-10-CM | POA: Diagnosis not present

## 2019-05-12 DIAGNOSIS — I10 Essential (primary) hypertension: Secondary | ICD-10-CM | POA: Diagnosis not present

## 2019-05-12 DIAGNOSIS — E119 Type 2 diabetes mellitus without complications: Secondary | ICD-10-CM | POA: Diagnosis not present

## 2019-05-12 DIAGNOSIS — I351 Nonrheumatic aortic (valve) insufficiency: Secondary | ICD-10-CM | POA: Diagnosis not present

## 2019-07-11 NOTE — Progress Notes (Signed)
Cardiology Office Note   Date:  07/13/2019   ID:  Danielle Richard, Danielle Richard [birth year], MRN [id number]  PCP:  Pearson Grippe, MD    No chief complaint on file.  HTN  Wt Readings from Last 3 Encounters:  07/13/19 108 lb (49 kg)  12/25/18 102 lb (46.3 kg)  12/11/18 105 lb (47.6 kg)       History of Present Illness: Danielle Richard is a 68 y.o. female  with chronic chest pain documented for the past 6-7 years, as of 12/17. It was thought to Barrett MSK in nature.   ETT in 6/18 was negative. Myoview in 3/19 showed:  Nuclear stress EF: 68%. The left ventricular ejection fraction is hyperdynamic (>65%).  The study is normal.  This is a low risk study.  There is no evidence of ischemia. Normal left ventricular systolic function.`  Echo in 2018 showed: Left ventricle: The cavity size was normal. Wall thickness was normal. Systolic function was normal. The estimated ejection fraction was in the range of 60% to 65%. Wall motion was normal; there were no regional wall motion abnormalities. Features are consistent with a pseudonormal left ventricular filling pattern, with concomitant abnormal relaxation and increased filling pressure (grade 2 diastolic dysfunction). - Aortic valve: There was mild to moderate regurgitation.  In early 2020,Dizziness has improved after HCTZ was stopped. Lisinopril was tried but she had cough and poorly controlled readings. PMD restarted triamterene.   At last visit, we stopped triamterene and start Losartan 25 mg daily. Want to avoid two drugs that can raise potassium.   Since that change, her BP has been in the 120s-135 systolic range.    1. Plan in 12/2018 was: "COntinue current meds.  Well controlled. She needs to work on minimizing salt intake.  2. CP: Resolved.   3. DM: Target BP < 130/80.  4. Describes some shortness of breath, only at the end of the day.  Triameterene was stopped and no edema reported.  If this persists, could  add low dose diuretic ( furosemide).  Daughtrer states that she has had this SHOB prior to losartan so it is unlikely to Londin a side effect of the medicine.  OK to try claritin over the counter for itchiness and allergy sx.  She did not tolerate fexofenadine in the past.   She has used prednisone for this in the past. I do not think it is related to loasartan. "   Since the last visit, she has done well.  BPs have been in the 120-140 range, typically 120.  She has been taking triamterene daily.  She has been taking losartan when her BP goes up.  History obtained through translator.  Based on our last visit, I would have expected that she would Irianna on losartan alone.    Denies : Dizziness. Leg edema. Nitroglycerin use. Orthopnea. Palpitations. Paroxysmal nocturnal dyspnea. Shortness of breath. Syncope.     Chest pain that occurs rarely, lasts 1 minute and is resolved with rubbing the area.  No exertional chest pain.     Past Medical History:  Diagnosis Date  . Aortic insufficiency    pt unaware of this  . Depression   . Diabetes mellitus, type II (HCC)   . Dizziness    chronic for 10 years and mild  . GERD (gastroesophageal reflux disease)   . H/O mammogram 2016   normal  . History of nuclear stress test 10/16/2017   negative for ischemia  . HLD (hyperlipidemia)   .  Hx of colonoscopy 2015   repeat 5 years  . Hypertension   . Insomnia   . Personal history of colonic polyps   . PUD (peptic ulcer disease)     Past Surgical History:  Procedure Laterality Date  . COLONOSCOPY WITH PROPOFOL N/A 09/29/2013   Procedure: COLONOSCOPY WITH PROPOFOL;  Surgeon: Garlan Fair, MD;  Location: [facility];  Service: Endoscopy;  Laterality: N/A;  . ESOPHAGOGASTRODUODENOSCOPY (EGD) WITH PROPOFOL N/A 09/29/2013   Procedure: ESOPHAGOGASTRODUODENOSCOPY (EGD) WITH PROPOFOL;  Surgeon: Garlan Fair, MD;  Location: [facility];  Service: Endoscopy;  Laterality: N/A;  . NO PAST SURGERIES        Current Outpatient Medications  Medication Sig Dispense Refill  . atorvastatin (LIPITOR) 40 MG tablet Take 1 tablet (40 mg total) by mouth daily. 30 tablet 5  . Blood Glucose Monitoring Suppl (TRUE METRIX METER) DEVI 1 each by Does not apply route 3 (three) times daily before meals. 1 Device 0  . calcium-vitamin D (OSCAL WITH D) 500-200 MG-UNIT tablet Take 1 tablet by mouth daily with breakfast.    . citalopram (CELEXA) 20 MG tablet Take 1 tablet (20 mg total) by mouth every morning. 60 tablet 3  . dicyclomine (BENTYL) 20 MG tablet Take 20 mg by mouth 4 (four) times daily -  before meals and at bedtime.   3  . fexofenadine (ALLEGRA) 180 MG tablet Take 180 mg by mouth as needed.     Marland Kitchen glucose blood (TRUE METRIX BLOOD GLUCOSE TEST) test strip Use 3 times daily before meals 60 each 12  . latanoprost (XALATAN) 0.005 % ophthalmic solution PLACE ONE DROP INTO BOTH EYES AT BEDTIME  4  . loratadine (CLARITIN) 10 MG tablet Take 1 tablet (10 mg total) by mouth daily. 90 tablet 3  . losartan (COZAAR) 25 MG tablet Take 1 tablet (25 mg total) by mouth daily. 90 tablet 3  . metFORMIN (GLUCOPHAGE) 500 MG tablet TAKE 1 TABLET (500 MG TOTAL) BY MOUTH 2 TIMES DAILY WITH A MEAL. 60 tablet 5  . Omega-3 Fatty Acids (FISH OIL) 1200 MG CAPS Take 1,200 mg by mouth daily.    Marland Kitchen omeprazole (PRILOSEC) 40 MG capsule Take 1 capsule by mouth 2 (two) times daily.  4  . PROLIA 60 MG/ML SOSY injection INJECT 60 MG Q 6 MONTHS  0  . sucralfate (CARAFATE) 1 g tablet Take 1 tablet by mouth. On an empty stomach three times daily before meals  4  . traZODone (DESYREL) 100 MG tablet Take 1 tablet (100 mg total) by mouth at bedtime. 30 tablet 5  . TRUEPLUS LANCETS 28G MISC 1 each by Does not apply route 3 (three) times daily before meals. 100 each 12   No current facility-administered medications for this visit.     Allergies:   Tylenol [acetaminophen] and Aspirin    Social History:  The patient  reports that she has never smoked.  She has never used smokeless tobacco. She reports previous alcohol use of about 2.0 standard drinks of alcohol per week. She reports that she does not use drugs.   Family History:  The patient's family history includes Breast cancer (age of onset: 56) in her sister; CAD in her mother; Cancer in her father; Diabetes type II in her brother; Heart disease in her father; High Cholesterol in her brother.    ROS:  Please see the history of present illness.   Otherwise, review of systems are positive for stable BP, rare chest pain.  All other systems are reviewed and negative.    PHYSICAL EXAM: VS:  BP 132/62   Pulse 68   Ht 5\' 1"  (1.549 m)   Wt 108 lb (49 kg)   SpO2 98%   BMI 20.41 kg/m  , BMI Body mass index is 20.41 kg/m. GEN: Well nourished, well developed, in no acute distress  HEENT: normal  Neck: no JVD, carotid bruits, or masses Cardiac: RRR; no murmurs, rubs, or gallops,no edema  Respiratory:  clear to auscultation bilaterally, normal work of breathing GI: soft, nontender, nondistended, + BS MS: no deformity or atrophy  Skin: warm and dry, no rash Neuro:  Strength and sensation are intact Psych: euthymic mood, full affect   EKG:   The ekg ordered today demonstrates NSR, nonspecific ST changes   Recent Labs: 12/17/2018: BUN 11; Creatinine, Ser 0.75; Potassium 3.9; Sodium 145   Lipid Panel    Component Value Date/Time   CHOL 133 07/11/2016 0942   TRIG 133 07/11/2016 0942   HDL 51 07/11/2016 0942   CHOLHDL 2.6 07/11/2016 0942   VLDL 27 07/11/2016 0942   LDLCALC 55 07/11/2016 0942     Other studies Reviewed: Additional studies/ records that were reviewed today with results demonstrating: labs reviewed. Left ventricular ejection fraction, by visual estimation, is 55 to 60%. The left ventricle has normal function. Normal left ventricular size. There is moderately increased left ventricular hypertrophy.  2. There is mild dilatation of the ascending aorta measuring 36 mm.   3. The tricuspid valve is normal in structure. Tricuspid valve regurgitation is trivial.  4. Global right ventricle has normal systolic function.The right ventricular size is normal. No increase in right ventricular wall thickness.  5. Right atrial size was normal.  6. Left ventricular diastolic Doppler parameters are consistent with impaired relaxation pattern of LV diastolic filling.  7. Left atrial size was normal.  8. The aortic valve is tricuspid Aortic valve regurgitation is moderate by color flow Doppler. Structurally normal aortic valve, with no evidence of sclerosis or stenosis.  9. The inferior vena cava is normal in size with greater than 50% respiratory variability, suggesting right atrial pressure of 3 mmHg. 10. The mitral valve is normal in structure. No evidence of mitral valve regurgitation. No evidence of mitral stenosis. 11. The tricuspid regurgitant velocity is 2.17 m/s, and with an assumed right atrial pressure of 3 mmHg, the estimated right ventricular systolic pressure is normal at 21.8 mmHg.  ASSESSMENT AND PLAN:  1. HTN: At home, BP readings around 120 systolic.  Highest at around 140 mm Hg, rare occasions.  Continue losartan.  She has been taking triamterene daily and using the losartan as needed per the family MD, per her report. She can f/u with Dr. Selena BattenKim as this appears to Vanilla under control. LVdiastolic dimension: 3.6 cm.  2. CP: Improved in early 2020.  Rare atypical CP that goes away if she rubs the area. 3. DM: A1C 5.9 in 03/2019. COntinue current lipid lowering therapy. 4. Claritin for seasonal allergies/itchiness. 5. AI: Moderate by echo in 2020.  No CHF sx.   Current medicines are reviewed at length with the patient today.  The patient concerns regarding her medicines were addressed.  The following changes have been made:  No change  Labs/ tests ordered today include:  No orders of the defined types were placed in this encounter.   Recommend 150 minutes/week  of aerobic exercise Low fat, low carb, high fiber diet recommended  Disposition:   FU  1 year for AI   Signed, Lance Muss, MD  07/13/2019 2:04 PM    [facility] [address] [place], [place], [place]  [address] Phone: [phone]; Fax: [phone]

## 2019-07-13 ENCOUNTER — Other Ambulatory Visit: Payer: Self-pay

## 2019-07-13 ENCOUNTER — Ambulatory Visit (INDEPENDENT_AMBULATORY_CARE_PROVIDER_SITE_OTHER): Payer: Medicare Other | Admitting: Interventional Cardiology

## 2019-07-13 ENCOUNTER — Encounter: Payer: Self-pay | Admitting: Interventional Cardiology

## 2019-07-13 VITALS — BP 132/62 | HR 68 | Ht 61.0 in | Wt 108.0 lb

## 2019-07-13 DIAGNOSIS — E785 Hyperlipidemia, unspecified: Secondary | ICD-10-CM

## 2019-07-13 DIAGNOSIS — I10 Essential (primary) hypertension: Secondary | ICD-10-CM | POA: Diagnosis not present

## 2019-07-13 DIAGNOSIS — E119 Type 2 diabetes mellitus without complications: Secondary | ICD-10-CM | POA: Diagnosis not present

## 2019-07-13 DIAGNOSIS — I351 Nonrheumatic aortic (valve) insufficiency: Secondary | ICD-10-CM

## 2019-07-13 DIAGNOSIS — R0789 Other chest pain: Secondary | ICD-10-CM | POA: Diagnosis not present

## 2019-07-13 DIAGNOSIS — Z794 Long term (current) use of insulin: Secondary | ICD-10-CM

## 2019-07-13 MED ORDER — ATORVASTATIN CALCIUM 40 MG PO TABS: 40.0000 mg | ORAL_TABLET | Freq: Every day | ORAL | 3 refills | Status: DC

## 2019-07-13 NOTE — Patient Instructions (Addendum)
Medication Instructions:  Your physician recommends that you continue on your current medications as directed. Please refer to the Current Medication list given to you today.  If you need a refill on your cardiac medications before your next appointment, please call your pharmacy.   Lab work: None Ordered  If you have labs (blood work) drawn today and your tests are completely normal, you will receive your results only by: Marland Kitchen MyChart Message (if you have MyChart) OR . A paper copy in the mail If you have any lab test that is abnormal or we need to change your treatment, we will call you to review the results.  Testing/Procedures: None ordered  Follow-Up: . Your physician wants you to follow-up in: 1 year. You will receive a reminder letter in the mail two months in advance. If you don't receive a letter, please call our office to schedule the follow-up appointment.\  Any Other Special Instructions Will Bailee Listed Below (If Applicable).

## 2019-07-15 DIAGNOSIS — Z8719 Personal history of other diseases of the digestive system: Secondary | ICD-10-CM | POA: Diagnosis not present

## 2019-07-20 DIAGNOSIS — E119 Type 2 diabetes mellitus without complications: Secondary | ICD-10-CM | POA: Diagnosis not present

## 2019-07-20 DIAGNOSIS — I10 Essential (primary) hypertension: Secondary | ICD-10-CM | POA: Diagnosis not present

## 2019-07-27 DIAGNOSIS — I10 Essential (primary) hypertension: Secondary | ICD-10-CM | POA: Diagnosis not present

## 2019-07-27 DIAGNOSIS — Z0000 Encounter for general adult medical examination without abnormal findings: Secondary | ICD-10-CM | POA: Diagnosis not present

## 2019-07-27 DIAGNOSIS — E119 Type 2 diabetes mellitus without complications: Secondary | ICD-10-CM | POA: Diagnosis not present

## 2019-07-27 DIAGNOSIS — E785 Hyperlipidemia, unspecified: Secondary | ICD-10-CM | POA: Diagnosis not present

## 2019-08-04 DIAGNOSIS — R7309 Other abnormal glucose: Secondary | ICD-10-CM | POA: Diagnosis not present

## 2019-08-04 DIAGNOSIS — H401222 Low-tension glaucoma, left eye, moderate stage: Secondary | ICD-10-CM | POA: Diagnosis not present

## 2019-08-04 DIAGNOSIS — H401211 Low-tension glaucoma, right eye, mild stage: Secondary | ICD-10-CM | POA: Diagnosis not present

## 2019-08-04 DIAGNOSIS — H04123 Dry eye syndrome of bilateral lacrimal glands: Secondary | ICD-10-CM | POA: Diagnosis not present

## 2019-10-01 ENCOUNTER — Ambulatory Visit: Payer: Medicare Other | Attending: Internal Medicine

## 2019-10-01 DIAGNOSIS — Z23 Encounter for immunization: Secondary | ICD-10-CM | POA: Insufficient documentation

## 2019-10-01 NOTE — Progress Notes (Signed)
   Covid-19 Vaccination Clinic  Name:  Danielle Richard    MRN: [id number] DOB: Sep 19, 1950  10/01/2019  Danielle Richard was observed post Covid-19 immunization for 15 minutes without incidence. She was provided with Vaccine Information Sheet and instruction to access the V-Safe system.   Danielle Richard was instructed to call 911 with any severe reactions post vaccine: Marland Kitchen Difficulty breathing  . Swelling of your face and throat  . A fast heartbeat  . A bad rash all over your body  . Dizziness and weakness    Immunizations Administered    Name Date Dose VIS Date Route   Pfizer COVID-19 Vaccine 10/01/2019  9:10 AM 0.3 mL 07/17/2019 Intramuscular   Manufacturer: [employer], [employer]   Lot: [id number]   NDC: [address]-8

## 2019-10-13 DIAGNOSIS — M810 Age-related osteoporosis without current pathological fracture: Secondary | ICD-10-CM | POA: Diagnosis not present

## 2019-10-27 ENCOUNTER — Ambulatory Visit: Payer: Medicare Other | Attending: Internal Medicine

## 2019-10-27 DIAGNOSIS — Z23 Encounter for immunization: Secondary | ICD-10-CM

## 2019-10-27 NOTE — Progress Notes (Signed)
   Covid-19 Vaccination Clinic  Name:  Danielle Richard    MRN: [id number] DOB: 22-Mar-1951  10/27/2019  Danielle Richard was observed post Covid-19 immunization for 15 minutes without incident. She was provided with Vaccine Information Sheet and instruction to access the V-Safe system.   Danielle Richard was instructed to call 911 with any severe reactions post vaccine: Marland Kitchen Difficulty breathing  . Swelling of face and throat  . A fast heartbeat  . A bad rash all over body  . Dizziness and weakness   Immunizations Administered    Name Date Dose VIS Date Route   Pfizer COVID-19 Vaccine 10/27/2019  8:32 AM 0.3 mL 07/17/2019 Intramuscular   Manufacturer: [employer], [employer]   Lot: [id number]   NDC: [address]-2

## 2019-10-29 ENCOUNTER — Encounter: Payer: Self-pay | Admitting: Internal Medicine

## 2019-11-25 ENCOUNTER — Institutional Professional Consult (permissible substitution): Payer: Medicare Other | Admitting: Internal Medicine

## 2019-11-30 ENCOUNTER — Ambulatory Visit (INDEPENDENT_AMBULATORY_CARE_PROVIDER_SITE_OTHER): Payer: Medicare Other | Admitting: Internal Medicine

## 2019-11-30 ENCOUNTER — Other Ambulatory Visit (INDEPENDENT_AMBULATORY_CARE_PROVIDER_SITE_OTHER): Payer: Medicare Other

## 2019-11-30 ENCOUNTER — Encounter: Payer: Self-pay | Admitting: Internal Medicine

## 2019-11-30 ENCOUNTER — Other Ambulatory Visit: Payer: Self-pay

## 2019-11-30 ENCOUNTER — Ambulatory Visit (INDEPENDENT_AMBULATORY_CARE_PROVIDER_SITE_OTHER): Payer: Medicare Other

## 2019-11-30 DIAGNOSIS — I10 Essential (primary) hypertension: Secondary | ICD-10-CM | POA: Diagnosis not present

## 2019-11-30 DIAGNOSIS — R058 Other specified cough: Secondary | ICD-10-CM | POA: Insufficient documentation

## 2019-11-30 DIAGNOSIS — R05 Cough: Secondary | ICD-10-CM

## 2019-11-30 LAB — CBC WITH DIFFERENTIAL/PLATELET
Basophils Absolute: 0 10*3/uL (ref 0.0–0.1)
Basophils Relative: 0.5 % (ref 0.0–3.0)
Eosinophils Absolute: 0.1 10*3/uL (ref 0.0–0.7)
Eosinophils Relative: 2.6 % (ref 0.0–5.0)
HCT: 37.3 % (ref 36.0–46.0)
Hemoglobin: 12.7 g/dL (ref 12.0–15.0)
Lymphocytes Relative: 23.3 % (ref 12.0–46.0)
Lymphs Abs: 1.1 10*3/uL (ref 0.7–4.0)
MCHC: 34.2 g/dL (ref 30.0–36.0)
MCV: 91 fl (ref 78.0–100.0)
Monocytes Absolute: 0.5 10*3/uL (ref 0.1–1.0)
Monocytes Relative: 9.3 % (ref 3.0–12.0)
Neutro Abs: 3.1 10*3/uL (ref 1.4–7.7)
Neutrophils Relative %: 64.3 % (ref 43.0–77.0)
Platelets: 313 10*3/uL (ref 150.0–400.0)
RBC: 4.1 Mil/uL (ref 3.87–5.11)
RDW: 13.8 % (ref 11.5–15.5)
WBC: 4.9 10*3/uL (ref 4.0–10.5)

## 2019-11-30 MED ORDER — PREDNISONE 10 MG PO TABS: ORAL_TABLET | ORAL | 0 refills | Status: DC

## 2019-11-30 NOTE — Progress Notes (Addendum)
Danielle Richard, female    DOB: [birth year],   MRN: [id number]   Brief patient profile:  69 yo vietnamese female never smoker with cough x 2019 made worse on ACEi stopped fall of 2019  And waxed and waned since better on prednisone for several weeks then recurred so referred to pulmonary clinic 11/30/2019 by Dr   Danielle Richard    History of Present Illness  11/30/2019  Pulmonary/ 1st office eval/Danielle Richard  Chief Complaint  Patient presents with  . Pulmonary Consult    Referred by Dr Danielle Richard.  Pt c/o cough for the past year- occ prod with yellow sputum. Cough seems worse at night.  Dyspnea:  Only if coughing  Cough: min  productive, usually all day long but esp late afternoon before supper and at hs but  Then quiets down and does not typically wake her up  Sleep: flat bed/ one pillow SABA use: not using  Overt HB/ chronic abd pain issues does not know GI doc's name    No obvious day to day or daytime variability or assoc excess/ purulent sputum or mucus plugs or hemoptysis or cp or chest tightness, subjective wheeze or overt sinus or hb symptoms.    Also denies any obvious fluctuation of symptoms with weather or environmental changes or other aggravating or alleviating factors except as outlined above   No unusual exposure hx or h/o childhood pna/ asthma or knowledge of premature birth.  Current Allergies, Complete Past Medical History, Past Surgical History, Family History, and Social History were reviewed in [employer] record.  ROS  The following are not active complaints unless bolded Hoarseness, sore throat, dysphagia, dental problems, itching, sneezing,  nasal congestion or discharge of excess mucus or purulent secretions, ear ache,   fever, chills, sweats, unintended wt loss or wt gain, classically pleuritic or exertional cp,  orthopnea pnd or arm/hand swelling  or leg swelling, presyncope, palpitations, abdominal pain, anorexia, nausea, vomiting, diarrhea  or change in  bowel habits or change in bladder habits, change in stools or change in urine, dysuria, hematuria,  rash, arthralgias, visual complaints, headache, numbness, weakness or ataxia or problems with walking or coordination,  change in mood or  memory.           Past Medical History:  Diagnosis Date  . Aortic insufficiency    pt unaware of this  . Depression   . Diabetes mellitus, type II (HCC)   . Dizziness    chronic for 10 years and mild  . GERD (gastroesophageal reflux disease)   . H/O mammogram 2016   normal  . History of nuclear stress test 10/16/2017   negative for ischemia  . HLD (hyperlipidemia)   . Hx of colonoscopy 2015   repeat 5 years  . Hypertension   . Insomnia   . Personal history of colonic polyps   . PUD (peptic ulcer disease)     Outpatient Medications Prior to Visit  Medication Sig Dispense Refill  . atorvastatin (LIPITOR) 40 MG tablet Take 1 tablet (40 mg total) by mouth daily. 90 tablet 3  . Blood Glucose Monitoring Suppl (TRUE METRIX METER) DEVI 1 each by Does not apply route 3 (three) times daily before meals. 1 Device 0  . citalopram (CELEXA) 20 MG tablet Take 1 tablet (20 mg total) by mouth every morning. 60 tablet 3  . dicyclomine (BENTYL) 20 MG tablet Take 20 mg by mouth 4 (four) times daily -  before meals and at  bedtime.   3  . fexofenadine (ALLEGRA) 180 MG tablet Take 180 mg by mouth as needed.     Marland Kitchen glucose blood (TRUE METRIX BLOOD GLUCOSE TEST) test strip Use 3 times daily before meals 60 each 12  . latanoprost (XALATAN) 0.005 % ophthalmic solution PLACE ONE DROP INTO BOTH EYES AT BEDTIME  4  . loratadine (CLARITIN) 10 MG tablet Take 1 tablet (10 mg total) by mouth daily. 90 tablet 3  . losartan (COZAAR) 25 MG tablet Take 25 mg by mouth daily as needed.    . metFORMIN (GLUCOPHAGE) 500 MG tablet TAKE 1 TABLET (500 MG TOTAL) BY MOUTH 2 TIMES DAILY WITH A MEAL. 60 tablet 5  . Omega-3 Fatty Acids (FISH OIL) 1200 MG CAPS Take 1,200 mg by mouth daily.    Marland Kitchen  omeprazole (PRILOSEC) 40 MG capsule Take 1 capsule by mouth 2 (two) times daily.  4  . PROLIA 60 MG/ML SOSY injection INJECT 60 MG Q 6 MONTHS  0  . sucralfate (CARAFATE) 1 g tablet Take 1 tablet by mouth. On an empty stomach three times daily before meals  4  . traZODone (DESYREL) 100 MG tablet Take 1 tablet (100 mg total) by mouth at bedtime. 30 tablet 5  . triamterene (DYRENIUM) 50 MG capsule Take 50 mg by mouth daily.    . TRUEPLUS LANCETS 28G MISC 1 each by Does not apply route 3 (three) times daily before meals. 100 each 12  . VITAMIN D PO Take 1 tablet by mouth daily.    . calcium-vitamin D (OSCAL WITH D) 500-200 MG-UNIT tablet Take 1 tablet by mouth daily with breakfast.           Objective:     BP 106/68 (BP Location: Left Arm, Cuff Size: Normal)   Pulse 77   Temp 97.8 F (36.6 C) (Temporal)   Ht 5\' 1"  (1.549 m)   Wt 108 lb (49 kg)   SpO2 98% Comment: on RA  BMI 20.41 kg/m   SpO2: 98 %(on RA)   amb thin vietenamese female freq throat clearing/ dry sounding cough    HEENT : pt wearing mask not removed for exam due to covid -19 concerns.    NECK :  without JVD/Nodes/TM/ nl carotid upstrokes bilaterally   LUNGS: no acc muscle use,  Nl contour chest which is clear to A and P bilaterally without cough on insp or exp maneuvers   CV:  RRR  no s3 or murmur or increase in P2, and no edema   ABD:  soft and nontender with nl inspiratory excursion in the supine position. No bruits or organomegaly appreciated, bowel sounds nl  MS:  Nl gait/ ext warm without deformities, calf tenderness, cyanosis or clubbing No obvious joint restrictions   SKIN: warm and dry without lesions    NEURO:  alert, approp, nl sensorium with  no motor or cerebellar deficits apparent.    CXR PA and Lateral:   11/30/2019 :    I personally reviewed images and   impression as follows:   Minimal increase markings in L base, non specific > repeat in 2 weeks         Assessment     Upper airway  cough syndrome Onset ? 2019 while on ACEi / partially responds to prednisone -  Allergy profile 11/30/2019 >  Eos 0. /  IgE -  11/30/2019   Max rx for gerd/ 1st gen H1 blockers per guidelines  / trial off losartan  The most common causes of chronic cough in immunocompetent adults include the following: upper airway cough syndrome (UACS), previously referred to as postnasal drip syndrome (PNDS), which is caused by variety of rhinosinus conditions; (2) asthma; (3) GERD; (4) chronic bronchitis from cigarette smoking or other inhaled environmental irritants; (5) nonasthmatic eosinophilic bronchitis; and (6) bronchiectasis.   These conditions, singly or in combination, have accounted for up to 94% of the causes of chronic cough in prospective studies.   Other conditions have constituted no >6% of the causes in prospective studies These have included bronchogenic carcinoma, chronic interstitial pneumonia, sarcoidosis, left ventricular failure, ACEI-induced cough, and aspiration from a condition associated with pharyngeal dysfunction.    Chronic cough is often simultaneously caused by more than one condition. A single cause has been found from 38 to 82% of the time, multiple causes from 18 to 62%. Multiply caused cough has been the result of three diseases up to 42% of the time.       >>> most likely this is Upper airway cough syndrome (previously labeled PNDS),  is so named because it's frequently impossible to sort out how much is  CR/sinusitis with freq throat clearing (which can Iraida related to primary GERD)   vs  causing  secondary (" extra esophageal")  GERD from wide swings in gastric pressure that occur with throat clearing, often  promoting self use of mint and menthol lozenges that reduce the lower esophageal sphincter tone and exacerbate the problem further in a cyclical fashion.   These are the same pts (now being labeled as having "irritable larynx syndrome" by some cough centers) who not  infrequently have a history of having failed to tolerate ace inhibitors,  dry powder inhalers or biphosphonates or report having atypical/extraesophageal reflux symptoms that don't respond to standard doses of PPI  and are easily confused as having aecopd or asthma flares by even experienced allergists/ pulmonologists (myself included).    >>> rec stop losartan, fish oil and max rx with PPI/ gerd diet  And also added 6 days of Prednisone in case of component of Th-2 driven upper or lower airways inflammation (if cough responds short term only to relapse befor return while will on rx for uacs that would point to allergic rhinitis/ asthma or eos bronchitis)      Advised:  The standardized cough guidelines published in Chest by [place] in 2006 are still the best available and consist of a multiple step process (up to 12!) , not a single office visit,  and are intended  to address this problem logically,  with an alogrithm dependent on response to empiric treatment at  each progressive step  to determine a specific diagnosis with  minimal addtional testing needed. Therefore if adherence is an issue or can't Kalyse accurately verified,  it's very unlikely the standard evaluation and treatment will Aviela successful here.    Furthermore, response to therapy (other than acute cough suppression, which should only Cynitha used short term with avoidance of narcotic containing cough syrups if possible), can Jozalyn a gradual process for which the patient is not likely to  perceive immediate benefit.  Unlike going to an eye doctor where the best perscription is almost always the first one and is immediately effective, this is almost never the case in the management of chronic cough syndromes. Therefore the patient needs to commit up front to consistently adhere to recommendations  for up to 6 weeks of therapy directed at the likely underlying problem(s) before  the response can Breckyn reasonably evaluated.   >>>So req to return  with all meds in hand using a trust but verify approach to confirm accurate Medication  Reconciliation The principal here is that until we are certain that the  patients are doing what we've asked, it makes no sense to ask them to do more.     Essential hypertension Try off losartan due to chronic cough  11/30/2019 >>>   For reasons that may related to vascular permability and nitric oxide pathways but not elevated  bradykinin levels (as seen with  ACEi use) losartan in the generic form has been reported now from mulitple sources  to cause a similar pattern of non-specific  upper airway symptoms as seen with acei.   This has not been reported with exposure to the other ARB's to date, so it seems reasonable for now to try either generic diovan or avapro if ARB needed or use an alternative class altogether.  See:  Dewayne Hatch Allergy Asthma Immunol  2008: 101: p 495-499    >>> will try off low dose losartan and use low dose amlodipine for now if BP up on just diuretic rx          Each maintenance medication was reviewed in detail including emphasizing most importantly the difference between maintenance and prns and under what circumstances the prns are to Lavada triggered using an action plan format where appropriate.  Total time for H and P, chart review, counseling with daughter serving as interpreter and generating customized AVS unique to this office visit in Development worker, international aid / charting = 60 min         Sandrea Hughs, MD 11/30/2019

## 2019-11-30 NOTE — Assessment & Plan Note (Addendum)
Try off losartan due to chronic cough  11/30/2019 >>>   For reasons that may related to vascular permability and nitric oxide pathways but not elevated  bradykinin levels (as seen with  ACEi use) losartan in the generic form has been reported now from mulitple sources  to cause a similar pattern of non-specific  upper airway symptoms as seen with acei.   This has not been reported with exposure to the other ARB's to date, so it seems reasonable for now to try either generic diovan or avapro if ARB needed or use an alternative class altogether.  See:  Dewayne Hatch Allergy Asthma Immunol  2008: 101: p 495-499    >>> will try off low dose losartan and use low dose amlodipine for now if BP up on just diuretic rx          Each maintenance medication was reviewed in detail including emphasizing most importantly the difference between maintenance and prns and under what circumstances the prns are to Cynia triggered using an action plan format where appropriate.   Total time for H and P, chart review, counseling with daughter serving as interpreter and generating customized AVS unique to this office visit in Development worker, international aid / charting = 60 min

## 2019-11-30 NOTE — Patient Instructions (Addendum)
Prednisone 10 mg take  4 each am x 2 days,   2 each am x 2 days,  1 each am x 2 days and stop    Stop losartan, clariton, benadryl, allegra, fish oil  - call me if blood pressure goes back up rather than restarting losartan   For drainage / throat tickle try take CHLORPHENIRAMINE  4 mg  (Chlortab 4mg   at should Alyda easiest to find in the green box)  take one every 4 hours as needed - available over the counter- may cause drowsiness so start with just a dose or two one hour before bedtime and see how you tolerate it before trying in daytime     Prilosec 40 mg Take 30- 60 min before your first and last meals of the day   GERD (REFLUX)  is an extremely common cause of respiratory symptoms just like yours , many times with no obvious heartburn at all.    It can Ambur treated with medication, but also with lifestyle changes including elevation of the head of your bed (ideally with 6 -8inch blocks under the headboard of your bed),  Smoking cessation, avoidance of late meals, excessive alcohol, and avoid fatty foods, chocolate, peppermint, colas, red wine, and acidic juices such as orange juice.  NO MINT OR MENTHOL PRODUCTS SO NO COUGH DROPS  USE SUGARLESS CANDY INSTEAD (Jolley ranchers or Stover's or Life Savers) or even ice chips will also do - the key is to swallow to prevent all throat clearing. NO OIL BASED VITAMINS - use powdered substitutes.  Avoid fish oil when coughing.   Please remember to go to the lab and x-ray department   for your tests - we will call you with the results when they are available.     Please schedule a follow up office visit in 2 weeks, call sooner if needed with all medications /inhalers/ solutions in hand so we can verify exactly what you are taking. This includes all medications from all doctors and over the counters - PLEASE separate them into two bags:  the ones you take automatically, no matter what, vs the ones you take just when you feel you need  them "BAG #2 is UP TO YOU"  - this will really help [employer] help you take your medications more effectively.       Prednisone 10 mg u?ng 4 l?n m?i sng x 2 ngy, m?i l?n 2 gi? sng x 2 ngy, 1 gi? sng x 2 ngy r?i d?ng   Ng?ng losartan, clariton, benadryl, allegra, d?u c - hy g?i cho ti n?u huy?t p t?ng tr? l?i thay v kh?i ??ng l?i losartan  ??i v?i ch?y n??c / nh?t ? c? h?ng, hy th? dng CHLORPHENIRAMINE 4 mg (Chlortab 4mg  t?i [facility] nn d? tm nh?t trong h?p mu xanh l cy) u?ng m?t l?n m?i 4 gi? n?u c?n - c s?n khng k ??n - c th? gy bu?n ng?, v v?y hy b?t ??u v?i li?u Hai m?t gi? tr??c khi ?i ng? v xem b?n ch?u ???c n nh? th? no tr??c khi th? vo ban ngy   Prilosec 40 mg U?ng 30- 60 pht tr??c b?a ?n ??u tin v b?a ?n cu?i cng trong ngy  GERD (REFLUX) l m?t nguyn nhn c?c k? ph? bi?n gy ra cc tri?u ch?ng h h?p gi?ng nh? c?a b?n, nhi?u l?n m khng c ch?ng ? chua r rng no c?.   N c th? ???c ?  i?u tr? b?ng thu?c, nh?ng c?ng c th? thay ??i l?i s?ng bao g?m k cao ??u gi??ng c?a b?n (l t??ng nh?t l v?i cc kh?i 6 -8 inch d??i ??u gi??ng c?a b?n), ng?ng ht thu?c, trnh ?n mu?n, u?ng qu nhi?u r??u v trnh th?c ?n bo , s c la, b?c h, cola, r??u vang ?? v n??c tri cy c tnh axit nh? n??c cam. S?N PH?M KHNG C MINT HO?C MENTHOL V KHNG C MI TR??NG S? D?NG K?O D?O C ???NG (Jolley ra farmhers ho?c Stover's ho?c Life Savers) ho?c th?m ch c? ? bo c?ng c th? lm ???c - ?i?u quan tr?ng l b?n ph?i nu?t ?? trnh h?t h?ng gi?ng. Idabel C VITAMIN B?NG D?U - s? d?ng ch?t thay th? d?ng b?t. Hessie Diener d?u c khi b? ho.   Hy nh? ??n phng th nghi?m v phng ch?p x-quang ?? lm xt nghi?m - chng ti s? g?i cho b?n ?? cung c?p k?t qu? khi c k?t qu?Toula Moos lng ln l?ch ti khm sau 2 tu?n, g?i s?m h?n n?u c?n v?i t?t c? cc lo?i thu?c / ?ng ht / dung d?ch trong tay ?? chng ti c th? xc minh chnh xc nh?ng g b?n ?ang dng. ?i?u ny bao g?m  t?t c? cc lo?i thu?c t? t?t c? cc bc s? v t?i qu?y - VUI LNG tch chng thnh hai ti: ti b?n l?y t? ??ng, khng c v?n ?? g, v ti b?n dng ngay khi b?n c?m th?y c?n. - ?i?u ny th?c s? s? gip chng ti gip b?n dng thu?c hi?u qu? h?n.

## 2019-11-30 NOTE — Assessment & Plan Note (Addendum)
Onset ? 2019 while on ACEi / partially responds to prednisone -  Allergy profile 11/30/2019 >  Eos 0. /  IgE -  11/30/2019   Max rx for gerd/ 1st gen H1 blockers per guidelines  / trial off losartan     The most common causes of chronic cough in immunocompetent adults include the following: upper airway cough syndrome (UACS), previously referred to as postnasal drip syndrome (PNDS), which is caused by variety of rhinosinus conditions; (2) asthma; (3) GERD; (4) chronic bronchitis from cigarette smoking or other inhaled environmental irritants; (5) nonasthmatic eosinophilic bronchitis; and (6) bronchiectasis.   These conditions, singly or in combination, have accounted for up to 94% of the causes of chronic cough in prospective studies.   Other conditions have constituted no >6% of the causes in prospective studies These have included bronchogenic carcinoma, chronic interstitial pneumonia, sarcoidosis, left ventricular failure, ACEI-induced cough, and aspiration from a condition associated with pharyngeal dysfunction.    Chronic cough is often simultaneously caused by more than one condition. A single cause has been found from 38 to 82% of the time, multiple causes from 18 to 62%. Multiply caused cough has been the result of three diseases up to 42% of the time.       >>> most likely this is Upper airway cough syndrome (previously labeled PNDS),  is so named because it's frequently impossible to sort out how much is  CR/sinusitis with freq throat clearing (which can Arsema related to primary GERD)   vs  causing  secondary (" extra esophageal")  GERD from wide swings in gastric pressure that occur with throat clearing, often  promoting self use of mint and menthol lozenges that reduce the lower esophageal sphincter tone and exacerbate the problem further in a cyclical fashion.   These are the same pts (now being labeled as having "irritable larynx syndrome" by some cough centers) who not infrequently have a  history of having failed to tolerate ace inhibitors,  dry powder inhalers or biphosphonates or report having atypical/extraesophageal reflux symptoms that don't respond to standard doses of PPI  and are easily confused as having aecopd or asthma flares by even experienced allergists/ pulmonologists (myself included).    >>> rec stop losartan, fish oil and max rx with PPI/ gerd diet  And also added 6 days of Prednisone in case of component of Th-2 driven upper or lower airways inflammation (if cough responds short term only to relapse befor return while will on rx for uacs that would point to allergic rhinitis/ asthma or eos bronchitis)     Advised:  The standardized cough guidelines published in Chest by [place] in 2006 are still the best available and consist of a multiple step process (up to 12!) , not a single office visit,  and are intended  to address this problem logically,  with an alogrithm dependent on response to empiric treatment at  each progressive step  to determine a specific diagnosis with  minimal addtional testing needed. Therefore if adherence is an issue or can't Munachimso accurately verified,  it's very unlikely the standard evaluation and treatment will Hamdi successful here.    Furthermore, response to therapy (other than acute cough suppression, which should only Raeleigh used short term with avoidance of narcotic containing cough syrups if possible), can Roberto a gradual process for which the patient is not likely to  perceive immediate benefit.  Unlike going to an eye doctor where the best perscription is almost always the first  one and is immediately effective, this is almost never the case in the management of chronic cough syndromes. Therefore the patient needs to commit up front to consistently adhere to recommendations  for up to 6 weeks of therapy directed at the likely underlying problem(s) before the response can Danise reasonably evaluated.   >>> So req to return with all meds in hand  using a trust but verify approach to confirm accurate Medication  Reconciliation The principal here is that until we are certain that the  patients are doing what we've asked, it makes no sense to ask them to do more.

## 2019-12-01 LAB — RESPIRATORY ALLERGY PROFILE REGION II ~~LOC~~
Allergen, A. alternata, m6: <0.1 kU/L
Allergen, Cedar tree, t12: <0.1 kU/L
Allergen, Comm Silver Birch, t9: <0.1 kU/L
Allergen, Cottonwood, t14: <0.1 kU/L
Allergen, D pternoyssinus,d7: 0.14 kU/L — ABNORMAL HIGH
Allergen, Mouse Urine Protein, e78: <0.1 kU/L
Allergen, Mulberry, t76: <0.1 kU/L
Allergen, Oak,t7: <0.1 kU/L
Allergen, P. notatum, m1: <0.1 kU/L
Aspergillus fumigatus, m3: <0.1 kU/L
Bermuda Grass: <0.1 kU/L
Box Elder IgE: <0.1 kU/L
CLADOSPORIUM HERBARUM (M2) IGE: <0.1 kU/L
COMMON RAGWEED (SHORT) (W1) IGE: <0.1 kU/L
Cat Dander: <0.1 kU/L
Class: 0
Class: 0
Class: 0
Class: 0
Class: 0
Class: 0
Class: 0
Class: 0
Class: 0
Class: 0
Class: 0
Class: 0
Class: 0
Class: 0
Class: 0
Class: 0
Class: 0
Class: 0
Class: 0
Class: 0
Class: 0
Cockroach: 0.14 kU/L — ABNORMAL HIGH
D. farinae: 0.11 kU/L — ABNORMAL HIGH
Dog Dander: <0.1 kU/L
Elm IgE: <0.1 kU/L
IgE (Immunoglobulin E), Serum: 7 kU/L (ref <=114)
Johnson Grass: <0.1 kU/L
Pecan/Hickory Tree IgE: <0.1 kU/L
Rough Pigweed  IgE: <0.1 kU/L
Sheep Sorrel IgE: <0.1 kU/L
Timothy Grass: <0.1 kU/L

## 2019-12-01 LAB — INTERPRETATION:

## 2019-12-01 NOTE — Progress Notes (Signed)
Already called and LMTCB for the pt's daughter

## 2019-12-01 NOTE — Progress Notes (Signed)
LMTCB for pt's daughter Luciano Cutter

## 2019-12-07 NOTE — Progress Notes (Signed)
Spoke with the pt's daughter ok per DPR and notified of results.

## 2019-12-07 NOTE — Progress Notes (Signed)
Spoke with the pt's daughter ok per DPR and notified of results ok per [facility]

## 2019-12-15 ENCOUNTER — Ambulatory Visit (INDEPENDENT_AMBULATORY_CARE_PROVIDER_SITE_OTHER): Payer: Medicare Other | Admitting: Internal Medicine

## 2019-12-15 ENCOUNTER — Other Ambulatory Visit: Payer: Self-pay

## 2019-12-15 ENCOUNTER — Ambulatory Visit (INDEPENDENT_AMBULATORY_CARE_PROVIDER_SITE_OTHER): Payer: Medicare Other

## 2019-12-15 ENCOUNTER — Encounter: Payer: Self-pay | Admitting: Internal Medicine

## 2019-12-15 VITALS — BP 150/68 | HR 67 | Temp 97.7°F | Wt 110.0 lb

## 2019-12-15 DIAGNOSIS — R05 Cough: Secondary | ICD-10-CM

## 2019-12-15 DIAGNOSIS — R059 Cough, unspecified: Secondary | ICD-10-CM

## 2019-12-15 DIAGNOSIS — R058 Other specified cough: Secondary | ICD-10-CM

## 2019-12-15 DIAGNOSIS — I10 Essential (primary) hypertension: Secondary | ICD-10-CM

## 2019-12-15 DIAGNOSIS — R918 Other nonspecific abnormal finding of lung field: Secondary | ICD-10-CM | POA: Diagnosis not present

## 2019-12-15 NOTE — Progress Notes (Signed)
Danielle Richard, female    DOB: September 02, 1950,   MRN: [id number]   Brief patient profile:  69 yo vietnamese female never smoker with cough x 2019 made worse on ACEi stopped fall of 2019  And waxed and waned since better on prednisone for several weeks then recurred so referred to pulmonary clinic 11/30/2019 by Dr   Pearson Grippe    History of Present Illness  11/30/2019  Pulmonary/ 1st office eval/Danielle Richard  Chief Complaint  Patient presents with  . Pulmonary Consult    Referred by Dr Pearson Grippe.  Pt c/o cough for the past year- occ prod with yellow sputum. Cough seems worse at night.  Dyspnea:  Only if coughing  Cough: min  productive, usually all day long but esp late afternoon before supper and at hs but  Then quiets down and does not typically wake her up  Sleep: flat bed/ one pillow SABA use: not using  Overt HB/ chronic abd pain issues does not know GI doc's name  rec Prednisone 10 mg take  4 each am x 2 days,   2 each am x 2 days,  1 each am x 2 days and stop  Stop losartan, clariton, benadryl, allegra, fish oil  - call me if blood pressure goes back up rather than restarting losartan  For drainage / throat tickle try take CHLORPHENIRAMINE  4 mg Prilosec 40 mg Take 30- 60 min before your first and last meals of the day  GERD diet  Please schedule a follow up office visit in 2 weeks, call sooner if needed with all medications     12/15/2019  f/u ov/Danielle Richard re:  Cough x 2019 worse on ACEi /  Did not bring meds / daughter helping with them Chief Complaint  Patient presents with  . Follow-up    cxr repeated today. Cough is some better but has not resolved. Mainly occurs at night-prod with yellow sputum.   Dyspnea:  Fine  Cough: after supper  Sleeping: once asleep no resp symptoms p one h1 taken one hour before hs  SABA use: none  02: none   No obvious day to day or daytime variability or assoc excess/ purulent sputum or mucus plugs or hemoptysis or cp or chest tightness, subjective wheeze or  overt sinus symptoms.   Sleeping p h1  without nocturnal  or early am exacerbation  of respiratory  c/o's or need for noct saba. Also denies any obvious fluctuation of symptoms with weather or environmental changes or other aggravating or alleviating factors except as outlined above   No unusual exposure hx or h/o childhood pna/ asthma or knowledge of premature birth.  Current Allergies, Complete Past Medical History, Past Surgical History, Family History, and Social History were reviewed in [employer] record.  ROS  The following are not active complaints unless bolded Hoarseness, sore throat, dysphagia= globus sensation, dental problems, itching, sneezing,  nasal congestion or discharge of excess mucus or purulent secretions, ear ache,   fever, chills, sweats, unintended wt loss or wt gain, classically pleuritic or exertional cp,  orthopnea pnd or arm/hand swelling  or leg swelling, presyncope, palpitations, abdominal pain/bloating and cramping since on ppi , anorexia, nausea, vomiting, diarrhea  or change in bowel habits or change in bladder habits, change in stools or change in urine, dysuria, hematuria,  rash, arthralgias, visual complaints, headache, numbness, weakness or ataxia or problems with walking or coordination,  change in mood or  memory.  Current Meds  Medication Sig  . atorvastatin (LIPITOR) 40 MG tablet Take 1 tablet (40 mg total) by mouth daily.  . Blood Glucose Monitoring Suppl (TRUE METRIX METER) DEVI 1 each by Does not apply route 3 (three) times daily before meals.  . citalopram (CELEXA) 20 MG tablet Take 1 tablet (20 mg total) by mouth every morning.  . dicyclomine (BENTYL) 20 MG tablet Take 20 mg by mouth 4 (four) times daily -  before meals and at bedtime.   .     . glucose blood (TRUE METRIX BLOOD GLUCOSE TEST) test strip Use 3 times daily before meals  . latanoprost (XALATAN) 0.005 % ophthalmic solution PLACE ONE DROP INTO BOTH EYES AT  BEDTIME  . metFORMIN (GLUCOPHAGE) 500 MG tablet TAKE 1 TABLET (500 MG TOTAL) BY MOUTH 2 TIMES DAILY WITH A MEAL.  Marland Kitchen omeprazole (PRILOSEC) 40 MG capsule Take 1 capsule by mouth 2 (two) times daily.  Marland Kitchen PROLIA 60 MG/ML SOSY injection INJECT 60 MG Q 6 MONTHS  . sucralfate (CARAFATE) 1 g tablet Take 1 tablet by mouth. On an empty stomach three times daily before meals  . traZODone (DESYREL) 100 MG tablet Take 1 tablet (100 mg total) by mouth at bedtime.  . triamterene (DYRENIUM) 50 MG capsule Take 50 mg by mouth daily.  . TRUEPLUS LANCETS 28G MISC 1 each by Does not apply route 3 (three) times daily before meals.  Marland Kitchen VITAMIN D PO Take 1 tablet by mouth daily.            Past Medical History:  Diagnosis Date  . Aortic insufficiency    pt unaware of this  . Depression   . Diabetes mellitus, type II (HCC)   . Dizziness    chronic for 10 years and mild  . GERD (gastroesophageal reflux disease)   . H/O mammogram 2016   normal  . History of nuclear stress test 10/16/2017   negative for ischemia  . HLD (hyperlipidemia)   . Hx of colonoscopy 2015   repeat 5 years  . Hypertension   . Insomnia   . Personal history of colonic polyps   . PUD (peptic ulcer disease)          Objective:    amb asian female nad   Wt Readings from Last 3 Encounters:  12/15/19 110 lb (49.9 kg)  11/30/19 108 lb (49 kg)  07/13/19 108 lb (49 kg)     Vital signs reviewed - Note on arrival 02 sats  99% on RA   HEENT : pt wearing mask not removed for exam due to covid -19 concerns.    NECK :  without JVD/Nodes/TM/ nl carotid upstrokes bilaterally   LUNGS: no acc muscle use,  Nl contour chest which is clear to A and P bilaterally without cough on insp or exp maneuvers   CV:  RRR  no s3 or murmur or increase in P2, and no edema   ABD:  soft and nontender with nl inspiratory excursion in the supine position. No bruits or organomegaly appreciated, bowel sounds nl  MS:  Nl gait/ ext warm without  deformities, calf tenderness, cyanosis or clubbing No obvious joint restrictions   SKIN: warm and dry without lesions    NEURO:  alert, approp, nl sensorium with  no motor or cerebellar deficits apparent.       CXR PA and Lateral:   12/15/2019 :    I personally reviewed images and agree with radiology impression as follows:  Persistent lingular infiltrate.       Assessment

## 2019-12-15 NOTE — Patient Instructions (Addendum)
Try off prilosec and use Pepcid 20 mg one after breakfast and supper (over the counter)  For drainage / throat tickle try take CHLORPHENIRAMINE  4 mg  (Chlortab 4mg   at should Fayrene easiest to find in the green box)  take one every 4 hours as needed - available over the counter- may cause drowsiness so start with just a bedtime dose or two and see how you tolerate it before trying in daytime     Ok to resume losartan    We will call to schedule ct chest and sinus    We will need Dr [employer] last office note    Please schedule a follow up office visit in 2 weeks, call sooner if needed with all medications /inhalers/ solutions in hand so we can verify exactly what you are taking. This includes all medications from all doctors and over the counters - PLEASE separate them into two bags:  the ones you take automatically, no matter what, vs the ones you take just when you feel you need them "BAG #2 is UP TO YOU"  - this will really help Laurey Morale help you take your medications more effectively.    Th? prilosec v s? d?ng Pepcid 20 mg m?t l?n sau b?a sng v b?a t?i (khng k ??n)  ??i v?i tnh tr?ng ch?y n??c / nh?t ? c? h?ng, hy th? dng CHLORPHENIRAMINE 4 mg (Chlortab 4mg  t?i [facility] nn d? tm th?y nh?t trong h?p mu xanh l cy) u?ng m?t l?n m?i 4 gi? n?u c?n - c s?n khng k ??n - c th? gy bu?n ng?, v v?y hy b?t ??u ch? v?i li?u tr??c khi ?i ng? ho?c hai v xem b?n ch?u ??ng n nh? th? no tr??c khi th? vo ban ngy   Ok ?? ti?p t?c losartan   Chng ti s? g?i ?i?n ?? ??t l?ch ct ng?c v xoang   Chng ti s? c?n ghi ch v?n phng cu?i cng c?a Ti?n s? [place]   Vui lng ln l?ch ti khm sau 2 tu?n, g?i s?m h?n n?u c?n v?i t?t c? cc lo?i thu?c / ?ng ht / dung d?ch trong tay ?? chng ti c th? xc minh chnh xc nh?ng g b?n ?ang dng. ?i?u ny bao g?m t?t c? cc lo?i thu?c t? t?t c? cc bc s? v t?i qu?y - VUI LNG tch chng thnh hai ti: ti b?n l?y t?  ??ng, khng c v?n ?? g, v ti b?n dng ngay khi b?n c?m th?y c?n. - ?i?u ny th?c s? s? gip chng ti gip b?n dng thu?c hi?u qu? h?n.

## 2019-12-16 ENCOUNTER — Encounter: Payer: Self-pay | Admitting: Internal Medicine

## 2019-12-16 DIAGNOSIS — R918 Other nonspecific abnormal finding of lung field: Secondary | ICD-10-CM | POA: Insufficient documentation

## 2019-12-16 MED ORDER — FAMOTIDINE 20 MG PO TABS
ORAL_TABLET | ORAL | Status: DC
Start: 2019-12-16 — End: 2021-01-10

## 2019-12-16 NOTE — Assessment & Plan Note (Addendum)
Never smoker 1st noted 11/30/19 and new since 08/09/16  > no change 12/15/2019 > Chest CT ct rec   Discussed in detail all the  indications, usual  risks and alternatives  relative to the benefits with patient who agrees to proceed with w/u as outlined.      Return in 2 weeks with all meds in hand using a trust but verify approach to confirm accurate Medication  Reconciliation The principal here is that until we are certain that the  patients are doing what we've asked, it makes no sense to ask them to do more.         Each maintenance medication was reviewed in detail including emphasizing most importantly the difference between maintenance and prns and under what circumstances the prns are to [place] triggered using an action plan format where appropriate.  Total time for H and P, chart review, counseling,   and generating customized AVS in [name] using Environmental manager) unique to this office visit / charting = 20 min

## 2019-12-16 NOTE — Assessment & Plan Note (Signed)
Onset ? 2019 while on ACEi / partially responds to prednisone -  Allergy profile 11/30/2019 >  Eos 0.1 /  IgE  7  Rast pos dust/ cockroach -  11/30/2019   Max rx for gerd/ 1st gen H1 blockers per guidelines  / trial off losartan   > no change 12/15/2019 so rec restart   Not clear whether the lingular changes are related to the cough or she has two sep problems but will continue w/u with sinus/chest ct next and in meantime   Ok to change to pepcid bid pc as ppi may Danielle Richard causing bloating

## 2019-12-16 NOTE — Assessment & Plan Note (Signed)
Try off losartan due to chronic cough  11/30/2019 >>>  No change cough > resume 12/15/2019

## 2019-12-28 ENCOUNTER — Telehealth: Payer: Self-pay | Admitting: Internal Medicine

## 2019-12-28 NOTE — Telephone Encounter (Signed)
LMTCB for Danielle Richard x 1

## 2019-12-28 NOTE — Telephone Encounter (Signed)
[facility] states order is correct now. Do not need to call back. Alisha phone number is [phone].

## 2019-12-28 NOTE — Telephone Encounter (Signed)
Noted, thank you! Nothing further needed at this time- will close encounter.

## 2019-12-29 ENCOUNTER — Ambulatory Visit
Admission: RE | Admit: 2019-12-29 | Discharge: 2019-12-29 | Disposition: A | Discharge status: Home / Self Care | Payer: Medicare Other | Source: Ambulatory Visit | Attending: Internal Medicine | Admitting: Internal Medicine

## 2019-12-29 DIAGNOSIS — R059 Cough, unspecified: Secondary | ICD-10-CM

## 2019-12-29 DIAGNOSIS — J479 Bronchiectasis, uncomplicated: Secondary | ICD-10-CM | POA: Diagnosis not present

## 2019-12-29 DIAGNOSIS — J342 Deviated nasal septum: Secondary | ICD-10-CM | POA: Diagnosis not present

## 2019-12-30 NOTE — Progress Notes (Signed)
Letter sent to patient due to language barrier. Results of CT explained.

## 2020-01-11 ENCOUNTER — Other Ambulatory Visit: Payer: Self-pay

## 2020-01-11 ENCOUNTER — Encounter: Payer: Self-pay | Admitting: Internal Medicine

## 2020-01-11 ENCOUNTER — Ambulatory Visit (INDEPENDENT_AMBULATORY_CARE_PROVIDER_SITE_OTHER): Payer: Medicare Other | Admitting: Internal Medicine

## 2020-01-11 DIAGNOSIS — R05 Cough: Secondary | ICD-10-CM | POA: Diagnosis not present

## 2020-01-11 DIAGNOSIS — R918 Other nonspecific abnormal finding of lung field: Secondary | ICD-10-CM | POA: Diagnosis not present

## 2020-01-11 DIAGNOSIS — R058 Other specified cough: Secondary | ICD-10-CM

## 2020-01-11 MED ORDER — LEVOFLOXACIN 500 MG PO TABS: 500.0000 mg | ORAL_TABLET | Freq: Every day | ORAL | 1 refills | Status: DC

## 2020-01-11 NOTE — Patient Instructions (Addendum)
Take 2 chlortabs at bedtime  (maximum dose is up to 2 every 4 hours if coughing)  For cough > mucinex dm up to 1200 mg every 12 hours as needed   Levaquin 500 mg daily x 7 days and repeat x 7 more days unless side effect (nausea, aches in tendon)   I not better after that then call to schedule bronchoscopy as outpatient to sample the airways   Please schedule a follow up visit in 3 months but call sooner if needed

## 2020-01-11 NOTE — Progress Notes (Signed)
Danielle Richard, female    DOB: [birth year],   MRN: [id number]   Brief patient profile:  69 yo vietnamese female never smoker with cough x 2019 made worse on ACEi stopped fall of 2019  And waxed and waned since better on prednisone for several weeks then recurred so referred to pulmonary clinic 11/30/2019 by Dr   Jani Gravel    History of Present Illness  11/30/2019  Pulmonary/ 1st office eval/Danielle Richard  Chief Complaint  Patient presents with  . Pulmonary Consult    Referred by Dr Jani Gravel.  Pt c/o cough for the past year- occ prod with yellow sputum. Cough seems worse at night.  Dyspnea:  Only if coughing  Cough: min  productive, usually all day long but esp late afternoon before supper and at hs but  Then quiets down and does not typically wake her up  Sleep: flat bed/ one pillow SABA use: not using  Overt HB/ chronic abd pain issues does not know GI doc's name  rec Prednisone 10 mg take  4 each am x 2 days,   2 each am x 2 days,  1 each am x 2 days and stop  Stop losartan, clariton, benadryl, allegra, fish oil  - call me if blood pressure goes back up rather than restarting losartan  For drainage / throat tickle try take CHLORPHENIRAMINE  4 mg Prilosec 40 mg Take 30- 60 min before your first and last meals of the day  GERD diet  Please schedule a follow up office visit in 2 weeks, call sooner if needed with all medications     12/15/2019  f/u ov/Danielle Richard re:  Cough x 2019 worse on ACEi /  Did not bring meds / daughter helping with them Chief Complaint  Patient presents with  . Follow-up    cxr repeated today. Cough is some better but has not resolved. Mainly occurs at night-prod with yellow sputum.   Dyspnea:  Fine  Cough: after supper  Sleeping: once asleep no resp symptoms p one h1 taken one hour before hs  SABA use: none  02: none Sleeping p h1  without nocturnal  or early am exacerbation rec Try off prilosec and use Pepcid 20 mg one after breakfast and supper (over the counter) For  drainage / throat tickle try take CHLORPHENIRAMINE  4 mg    Ok to resume losartan  We will call to schedule ct chest  > c/w MAI  We will need Dr Encarnacion Slates last office note  Please schedule a follow up office visit in 2 weeks,   01/11/2020  f/u ov/Danielle Richard re:  Chronic cough Danielle Richard on hrct Chief Complaint  Patient presents with  . Follow-up    1 month f/u. Per daughter, her cough has improved since last visit.   Dyspnea:  Not limited by breathing from desired activities   Cough: still worse p supper despite ppi bid ac/ attributes to pnds and some better p h1 but rarely uses / min mucoid Sleeping: says always meant to say cough worse at hs  And always has been  SABA use: none     No obvious day to day or daytime variability or assoc excess/ purulent sputum or mucus plugs or hemoptysis or cp or chest tightness, subjective wheeze or overt sinus or hb symptoms.     Also denies any obvious fluctuation of symptoms with weather or environmental changes or other aggravating or alleviating factors except as outlined above   No unusual exposure  hx or h/o childhood pna/ asthma or knowledge of premature birth.  Current Allergies, Complete Past Medical History, Past Surgical History, Family History, and Social History were reviewed in [employer] record.  ROS  The following are not active complaints unless bolded Hoarseness, sore throat, dysphagia, dental problems, itching, sneezing,  nasal congestion or discharge of excess mucus or purulent secretions, ear ache,   fever, chills, sweats, unintended wt loss or wt gain, classically pleuritic or exertional cp,  orthopnea pnd or arm/hand swelling  or leg swelling, presyncope, palpitations, abdominal pain, anorexia, nausea, vomiting, diarrhea  or change in bowel habits or change in bladder habits, change in stools or change in urine, dysuria, hematuria,  rash, arthralgias, visual complaints, headache, numbness, weakness or ataxia or problems  with walking or coordination,  change in mood or  memory.        Current Meds  Medication Sig  . atorvastatin (LIPITOR) 40 MG tablet Take 1 tablet (40 mg total) by mouth daily.  . Blood Glucose Monitoring Suppl (TRUE METRIX METER) DEVI 1 each by Does not apply route 3 (three) times daily before meals.  . citalopram (CELEXA) 20 MG tablet Take 1 tablet (20 mg total) by mouth every morning.  . dicyclomine (BENTYL) 20 MG tablet Take 20 mg by mouth 4 (four) times daily -  before meals and at bedtime.   . famotidine (PEPCID) 20 MG tablet One after breakfast and supper  . fexofenadine (ALLEGRA) 180 MG tablet Take 180 mg by mouth as needed.   Marland Kitchen glucose blood (TRUE METRIX BLOOD GLUCOSE TEST) test strip Use 3 times daily before meals  . latanoprost (XALATAN) 0.005 % ophthalmic solution PLACE ONE DROP INTO BOTH EYES AT BEDTIME  . metFORMIN (GLUCOPHAGE) 500 MG tablet TAKE 1 TABLET (500 MG TOTAL) BY MOUTH 2 TIMES DAILY WITH A MEAL.  Marland Kitchen PROLIA 60 MG/ML SOSY injection INJECT 60 MG Q 6 MONTHS  . sucralfate (CARAFATE) 1 g tablet Take 1 tablet by mouth. On an empty stomach three times daily before meals  . traZODone (DESYREL) 100 MG tablet Take 1 tablet (100 mg total) by mouth at bedtime.  . triamterene (DYRENIUM) 50 MG capsule Take 50 mg by mouth daily.  . TRUEPLUS LANCETS 28G MISC 1 each by Does not apply route 3 (three) times daily before meals.  Marland Kitchen VITAMIN D PO Take 1 tablet by mouth daily.             Past Medical History:  Diagnosis Date  . Aortic insufficiency    pt unaware of this  . Depression   . Diabetes mellitus, type II (HCC)   . Dizziness    chronic for 10 years and mild  . GERD (gastroesophageal reflux disease)   . H/O mammogram 2016   normal  . History of nuclear stress test 10/16/2017   negative for ischemia  . HLD (hyperlipidemia)   . Hx of colonoscopy 2015   repeat 5 years  . Hypertension   . Insomnia   . Personal history of colonic polyps   . PUD (peptic ulcer disease)            Objective:     amb thin asian female nad    01/11/2020         108   12/15/19 110 lb (49.9 kg)  11/30/19 108 lb (49 kg)  07/13/19 108 lb (49 kg)    Vital signs reviewed  01/11/2020  - Note at rest 02 sats  98%  on RA   HEENT : pt wearing mask not removed for exam due to covid -19 concerns.    NECK :  without JVD/Nodes/TM/ nl carotid upstrokes bilaterally   LUNGS: no acc muscle use,  Nl contour chest which is clear to A and P bilaterally without cough on insp or exp maneuvers   CV:  RRR  no s3 or murmur or increase in P2, and no edema   ABD:  soft and nontender with nl inspiratory excursion in the supine position. No bruits or organomegaly appreciated, bowel sounds nl  MS:  Nl gait/ ext warm without deformities, calf tenderness, cyanosis or clubbing No obvious joint restrictions   SKIN: warm and dry without lesions    NEURO:  alert, approp, nl sensorium with  no motor or cerebellar deficits apparent.            Assessment

## 2020-01-13 ENCOUNTER — Encounter: Payer: Self-pay | Admitting: Internal Medicine

## 2020-01-13 NOTE — Assessment & Plan Note (Signed)
Onset ? 2019 while on ACEi / partially responds to prednisone -  Allergy profile 11/30/2019 >  Eos 0.1 /  IgE  7  Rast pos dust/ cockroach -  11/30/2019   Max rx for gerd/ 1st gen H1 blockers per guidelines  / trial off losartan   > no change 12/15/2019 so rec restart  - Sinus CT 12/29/19 :  1. Mucosal thickening throughout the ethmoid air cells bilaterally. Mild mucosal thickening at several other sites. No air-fluid level. No bony destruction or expansion. Ostiomeatal unit complexes are patent bilaterally. 2.  Rightward deviation of nasal septum.  No nares obstruction  May Albirda responding to rx for pnds so again rec For drainage / throat tickle try take CHLORPHENIRAMINE  4 mg  (Chlortab 4mg   at should [place] easiest to find in the green box)  take one every 4 hours as needed - available over the counter- may cause drowsiness so start with just a bedtime dose or two and see how you tolerate it before trying in daytime

## 2020-01-13 NOTE — Assessment & Plan Note (Signed)
Never smoker 1st noted 11/30/19 and new since 08/09/16  > no change 12/15/2019  - CT chest 12/29/2019 1. 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 01/11/2020 rec levaquin 500 x up to 2 week rx to see what effects this has on cough   Discussed in detail all the  indications, usual  risks and alternatives  relative to the benefits with patient/daughter serving as interpreter/ they agee to proceed with Rx as outlined and if no better consider fob with bal.         Each maintenance medication was reviewed in detail including emphasizing most importantly the difference between maintenance and prns and under what circumstances the prns are to [place] triggered using an action plan format where appropriate.  Total time for H and P, chart review, counseling,  and generating customized AVS unique to this office visit / charting = 20 min

## 2020-01-18 DIAGNOSIS — I10 Essential (primary) hypertension: Secondary | ICD-10-CM | POA: Diagnosis not present

## 2020-01-18 DIAGNOSIS — E119 Type 2 diabetes mellitus without complications: Secondary | ICD-10-CM | POA: Diagnosis not present

## 2020-01-22 ENCOUNTER — Telehealth: Payer: Self-pay | Admitting: Internal Medicine

## 2020-01-22 MED ORDER — CIPROFLOXACIN HCL 500 MG PO TABS
500.0000 mg | ORAL_TABLET | Freq: Two times a day (BID) | ORAL | 0 refills | Status: DC
Start: 2020-01-22 — End: 2020-05-04

## 2020-01-22 NOTE — Telephone Encounter (Signed)
If there was no difference in the cough then no reason to try any antibiotics and we'll likely need to schedule bronchoscopy.  If cough improved, then wait until it worsens and this time try cipro 500 mg bid x 10 days (doesn't usually cause aches but still can and stop if this happens again)

## 2020-01-22 NOTE — Telephone Encounter (Signed)
Spoke with patient's daughter. She stated that her mom told her that she stated taking Levaquin 500mg  on 6/7 but stopped taking the medication on 6/10-6/11 due to increased joint pain and bilateral leg pain. 02-22-1981 wasn't made aware of this until 6/13. She has not finished the antibiotic.   Since she has stopped the Levaquin, the joint and leg pain have completely stopped. Her coughing is almost gone. She denied any other symptoms.    She wants to know if MW would prescribe another abx for her.   Pharmacy is [facility] on [address]   MW, please advise. Thanks!

## 2020-01-22 NOTE — Telephone Encounter (Signed)
Spoke with patient's daughter. She verbalized understanding. She stated that due to it being the weekend she wishes to have the cipro called in just case she needs it this weekend. RX has been sent to [facility] on [address].   Nothing further needed at time of call.

## 2020-01-25 DIAGNOSIS — E785 Hyperlipidemia, unspecified: Secondary | ICD-10-CM | POA: Diagnosis not present

## 2020-01-25 DIAGNOSIS — E119 Type 2 diabetes mellitus without complications: Secondary | ICD-10-CM | POA: Diagnosis not present

## 2020-01-25 DIAGNOSIS — I10 Essential (primary) hypertension: Secondary | ICD-10-CM | POA: Diagnosis not present

## 2020-02-09 DIAGNOSIS — H401211 Low-tension glaucoma, right eye, mild stage: Secondary | ICD-10-CM | POA: Diagnosis not present

## 2020-02-09 DIAGNOSIS — H04123 Dry eye syndrome of bilateral lacrimal glands: Secondary | ICD-10-CM | POA: Diagnosis not present

## 2020-02-09 DIAGNOSIS — H401222 Low-tension glaucoma, left eye, moderate stage: Secondary | ICD-10-CM | POA: Diagnosis not present

## 2020-02-09 DIAGNOSIS — H40033 Anatomical narrow angle, bilateral: Secondary | ICD-10-CM | POA: Diagnosis not present

## 2020-02-18 ENCOUNTER — Other Ambulatory Visit: Payer: Self-pay | Admitting: Internal Medicine

## 2020-02-18 DIAGNOSIS — Z1231 Encounter for screening mammogram for malignant neoplasm of breast: Secondary | ICD-10-CM

## 2020-03-03 ENCOUNTER — Ambulatory Visit
Admission: RE | Admit: 2020-03-03 | Discharge: 2020-03-03 | Disposition: A | Discharge status: Home / Self Care | Payer: Medicare Other | Source: Ambulatory Visit | Attending: Internal Medicine | Admitting: Internal Medicine

## 2020-03-03 ENCOUNTER — Other Ambulatory Visit: Payer: Self-pay

## 2020-03-03 DIAGNOSIS — Z1231 Encounter for screening mammogram for malignant neoplasm of breast: Secondary | ICD-10-CM | POA: Diagnosis not present

## 2020-04-12 ENCOUNTER — Ambulatory Visit: Payer: Medicare Other | Admitting: Internal Medicine

## 2020-04-19 DIAGNOSIS — M810 Age-related osteoporosis without current pathological fracture: Secondary | ICD-10-CM | POA: Diagnosis not present

## 2020-04-20 ENCOUNTER — Ambulatory Visit: Payer: Medicare Other | Admitting: Internal Medicine

## 2020-05-04 ENCOUNTER — Other Ambulatory Visit: Payer: Self-pay

## 2020-05-04 ENCOUNTER — Ambulatory Visit (INDEPENDENT_AMBULATORY_CARE_PROVIDER_SITE_OTHER): Payer: Medicare Other | Admitting: Internal Medicine

## 2020-05-04 ENCOUNTER — Encounter: Payer: Self-pay | Admitting: Internal Medicine

## 2020-05-04 ENCOUNTER — Ambulatory Visit (INDEPENDENT_AMBULATORY_CARE_PROVIDER_SITE_OTHER): Payer: Medicare Other

## 2020-05-04 DIAGNOSIS — R918 Other nonspecific abnormal finding of lung field: Secondary | ICD-10-CM | POA: Diagnosis not present

## 2020-05-04 DIAGNOSIS — R058 Other specified cough: Secondary | ICD-10-CM

## 2020-05-04 DIAGNOSIS — J8489 Other specified interstitial pulmonary diseases: Secondary | ICD-10-CM | POA: Diagnosis not present

## 2020-05-04 DIAGNOSIS — R05 Cough: Secondary | ICD-10-CM

## 2020-05-04 NOTE — Progress Notes (Signed)
Danielle Richard, female    DOB: 1951-06-14,   MRN: [id number]   Brief patient profile:  69 yo vietnamese female never smoker with cough x 2019 made worse on ACEi stopped fall of 2019  And waxed and waned since better on prednisone for several weeks then recurred so referred to pulmonary clinic 11/30/2019 by Dr   Pearson Grippe    History of Present Illness  11/30/2019  Pulmonary/ 1st office eval/Deray Dawes  Chief Complaint  Patient presents with  . Pulmonary Consult    Referred by Dr Pearson Grippe.  Pt c/o cough for the past year- occ prod with yellow sputum. Cough seems worse at night.  Dyspnea:  Only if coughing  Cough: min  productive, usually all day long but esp late afternoon before supper and at hs but  Then quiets down and does not typically wake her up  Sleep: flat bed/ one pillow SABA use: not using  Overt HB/ chronic abd pain issues does not know GI doc's name  rec Prednisone 10 mg take  4 each am x 2 days,   2 each am x 2 days,  1 each am x 2 days and stop  Stop losartan, clariton, benadryl, allegra, fish oil  - call me if blood pressure goes back up rather than restarting losartan  For drainage / throat tickle try take CHLORPHENIRAMINE  4 mg Prilosec 40 mg Take 30- 60 min before your first and last meals of the day  GERD diet  Please schedule a follow up office visit in 2 weeks, call sooner if needed with all medications     12/15/2019  f/u ov/Kevon Tench re:  Cough x 2019 worse on ACEi /  Did not bring meds / daughter helping with them Chief Complaint  Patient presents with  . Follow-up    cxr repeated today. Cough is some better but has not resolved. Mainly occurs at night-prod with yellow sputum.   Dyspnea:  Fine  Cough: after supper  Sleeping: once asleep no resp symptoms p one h1 taken one hour before hs  SABA use: none  02: none Sleeping p h1  without nocturnal  or early am exacerbation rec Try off prilosec and use Pepcid 20 mg one after breakfast and supper (over the counter) For  drainage / throat tickle try take CHLORPHENIRAMINE  4 mg    Ok to resume losartan  We will call to schedule ct chest  > c/w MAI  We will need Dr Laurey Morale last office note  Please schedule a follow up office visit in 2 weeks,   01/11/2020  f/u ov/Veatrice Eckstein re:  Chronic cough [name] on hrct Chief Complaint  Patient presents with  . Follow-up    1 month f/u. Per daughter, her cough has improved since last visit.   Dyspnea:  Not limited by breathing from desired activities   Cough: still worse p supper despite ppi bid ac/ attributes to pnds and some better p h1 but rarely uses / min mucoid Sleeping: says always meant to say cough worse at hs  And always has been  SABA use: none  rec Take 2 chlortabs at bedtime  (maximum dose is up to 2 every 4 hours if coughing) For cough > mucinex dm up to 1200 mg every 12 hours as needed  Levaquin 500 mg daily x 7 days  Please schedule a follow up visit in 3 months but call sooner if needed    05/04/2020  f/u ov/Naveen Clardy re:  MAI  On HRCT/ no active symptoms  Chief Complaint  Patient presents with  . Follow-up  Dyspnea:  Not limited by breathing from desired activities   Cough: none  Sleeping: able to lie flat now 2 pillows  SABA use: none  02: none    No obvious day to day or daytime variability or assoc excess/ purulent sputum or mucus plugs or hemoptysis or cp or chest tightness, subjective wheeze or overt sinus or hb symptoms.   sleeping without nocturnal  or early am exacerbation  of respiratory  c/o's or need for noct saba. Also denies any obvious fluctuation of symptoms with weather or environmental changes or other aggravating or alleviating factors except as outlined above   No unusual exposure hx or h/o childhood pna/ asthma or knowledge of premature birth.  Current Allergies, Complete Past Medical History, Past Surgical History, Family History, and Social History were reviewed in [employer] record.  ROS  The following are  not active complaints unless bolded Hoarseness, sore throat, dysphagia, dental problems, itching, sneezing,  nasal congestion or discharge of excess mucus or purulent secretions, ear ache,   fever, chills, sweats, unintended wt loss or wt gain, classically pleuritic or exertional cp,  orthopnea pnd or arm/hand swelling  or leg swelling, presyncope, palpitations, abdominal pain, anorexia, nausea, vomiting, diarrhea  or change in bowel habits or change in bladder habits, change in stools or change in urine, dysuria, hematuria,  rash, arthralgias, visual complaints, headache, numbness, weakness or ataxia or problems with walking or coordination,  change in mood or  memory.        Current Meds  Medication Sig  . atorvastatin (LIPITOR) 40 MG tablet Take 1 tablet (40 mg total) by mouth daily.  . Blood Glucose Monitoring Suppl (TRUE METRIX METER) DEVI 1 each by Does not apply route 3 (three) times daily before meals.  . citalopram (CELEXA) 20 MG tablet Take 1 tablet (20 mg total) by mouth every morning.  . dicyclomine (BENTYL) 20 MG tablet Take 20 mg by mouth 4 (four) times daily -  before meals and at bedtime.   . famotidine (PEPCID) 20 MG tablet One after breakfast and supper  . fexofenadine (ALLEGRA) 180 MG tablet Take 180 mg by mouth as needed.   Marland Kitchen glucose blood (TRUE METRIX BLOOD GLUCOSE TEST) test strip Use 3 times daily before meals  . latanoprost (XALATAN) 0.005 % ophthalmic solution PLACE ONE DROP INTO BOTH EYES AT BEDTIME  . metFORMIN (GLUCOPHAGE) 500 MG tablet TAKE 1 TABLET (500 MG TOTAL) BY MOUTH 2 TIMES DAILY WITH A MEAL.  Marland Kitchen PROLIA 60 MG/ML SOSY injection INJECT 60 MG Q 6 MONTHS  . sucralfate (CARAFATE) 1 g tablet Take 1 tablet by mouth. On an empty stomach three times daily before meals  . traZODone (DESYREL) 100 MG tablet Take 1 tablet (100 mg total) by mouth at bedtime.  . triamterene (DYRENIUM) 50 MG capsule Take 50 mg by mouth daily.  . TRUEPLUS LANCETS 28G MISC 1 each by Does not apply  route 3 (three) times daily before meals.  Marland Kitchen VITAMIN D PO Take 1 tablet by mouth daily.  . [DISCONTINUED] ciprofloxacin (CIPRO) 500 MG tablet                     Past Medical History:  Diagnosis Date  . Aortic insufficiency    pt unaware of this  . Depression   . Diabetes mellitus, type II (HCC)   . Dizziness  chronic for 10 years and mild  . GERD (gastroesophageal reflux disease)   . H/O mammogram 2016   normal  . History of nuclear stress test 10/16/2017   negative for ischemia  . HLD (hyperlipidemia)   . Hx of colonoscopy 2015   repeat 5 years  . Hypertension   . Insomnia   . Personal history of colonic polyps   . PUD (peptic ulcer disease)          Objective:    amb  Vietnamese female nad    05/04/2020        108   01/11/2020         108   12/15/19 110 lb (49.9 kg)  11/30/19 108 lb (49 kg)  07/13/19 108 lb (49 kg)    Vital signs reviewed  05/04/2020  - Note at rest 02 sats  100% on RA       HEENT : pt wearing mask not removed for exam due to covid -19 concerns.    NECK :  without JVD/Nodes/TM/ nl carotid upstrokes bilaterally   LUNGS: no acc muscle use,  Nl contour chest which is clear to A and P bilaterally without cough on insp or exp maneuvers   CV:  RRR  no s3 or murmur or increase in P2, and no edema   ABD:  soft and nontender with nl inspiratory excursion in the supine position. No bruits or organomegaly appreciated, bowel sounds nl  MS:  Nl gait/ ext warm without deformities, calf tenderness, cyanosis or clubbing No obvious joint restrictions   SKIN: warm and dry without lesions    NEURO:  alert, approp, nl sensorium with  no motor or cerebellar deficits apparent.    CXR PA and Lateral:   05/04/2020 :    I personally reviewed images and   impression as follows:    Lingular infiltrates improved.            Assessment

## 2020-05-04 NOTE — Patient Instructions (Signed)
Please remember to go to the  x-ray department  for your tests - we will call you with the results when they are available     Please schedule a follow up visit in 3 months but call sooner if needed  

## 2020-05-05 ENCOUNTER — Encounter: Payer: Self-pay | Admitting: Internal Medicine

## 2020-05-05 NOTE — Assessment & Plan Note (Addendum)
Never smoker 1st noted 11/30/19 and new since 08/09/16  > no change 12/15/2019  - CT chest 12/29/2019 1. 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   F/u in 3 months with cxr/ sooner prn          Each maintenance medication was reviewed in detail including emphasizing most importantly the difference between maintenance and prns and under what circumstances the prns are to [place] triggered using an action plan format where appropriate.  Total time for H and P, chart review, counseling via daugher, RN, teaching device and generating customized AVS unique to this office visit / charting = 15 min

## 2020-05-05 NOTE — Assessment & Plan Note (Addendum)
Onset ? 2019 while on ACEi / partially responds to prednisone -  Allergy profile 11/30/2019 >  Eos 0.1 /  IgE  7  Rast pos dust/ cockroach -  11/30/2019   Max rx for gerd/ 1st gen H1 blockers per guidelines  / trial off losartan   > no change 12/15/2019 so rec restart  - Sinus CT 12/29/19 :  1. Mucosal thickening throughout the ethmoid air cells bilaterally. Mild mucosal thickening at several other sites. No air-fluid level. No bony destruction or expansion. Ostiomeatal unit complexes are patent bilaterally. 2.  Rightward deviation of nasal septum.  No nares obstruction  Controlled on 1st gen H1 blockers per guidelines  > no change recs

## 2020-05-06 NOTE — Progress Notes (Signed)
Called interpreter line and had on the line just in case, Clinical research associate spoke with patient's daughter, Johnn Hai per [facility] and went over xray results per Dr Sherene Sires. All questions answered and daughter expressed understanding. Nothing further needed at this time.

## 2020-07-26 DIAGNOSIS — Z0000 Encounter for general adult medical examination without abnormal findings: Secondary | ICD-10-CM | POA: Diagnosis not present

## 2020-07-26 DIAGNOSIS — E119 Type 2 diabetes mellitus without complications: Secondary | ICD-10-CM | POA: Diagnosis not present

## 2020-07-26 DIAGNOSIS — E785 Hyperlipidemia, unspecified: Secondary | ICD-10-CM | POA: Diagnosis not present

## 2020-08-09 ENCOUNTER — Encounter: Payer: Self-pay | Admitting: Internal Medicine

## 2020-08-09 ENCOUNTER — Ambulatory Visit (INDEPENDENT_AMBULATORY_CARE_PROVIDER_SITE_OTHER): Payer: Medicare Other

## 2020-08-09 ENCOUNTER — Ambulatory Visit (INDEPENDENT_AMBULATORY_CARE_PROVIDER_SITE_OTHER): Payer: Medicare Other | Admitting: Internal Medicine

## 2020-08-09 ENCOUNTER — Other Ambulatory Visit: Payer: Self-pay

## 2020-08-09 DIAGNOSIS — R918 Other nonspecific abnormal finding of lung field: Secondary | ICD-10-CM

## 2020-08-09 DIAGNOSIS — R058 Other specified cough: Secondary | ICD-10-CM | POA: Diagnosis not present

## 2020-08-09 NOTE — Progress Notes (Signed)
Danielle Richard, female    DOB: 1951-07-04  MRN: [id number]   Brief patient profile:  70 yo [place] female never smoker with cough x 2019 made worse on ACEi stopped fall of 2019  And waxed and waned since better on prednisone for several weeks then recurred so referred to pulmonary clinic 11/30/2019 by Dr   Pearson Grippe    History of Present Illness  11/30/2019  Pulmonary/ 1st office eval/Danielle Richard  Chief Complaint  Patient presents with  . Pulmonary Consult    Referred by Dr Pearson Grippe.  Pt c/o cough for the past year- occ prod with yellow sputum. Cough seems worse at night.  Dyspnea:  Only if coughing  Cough: min  productive, usually all day long but esp late afternoon before supper and at hs but  Then quiets down and does not typically wake her up  Sleep: flat bed/ one pillow SABA use: not using  Overt HB/ chronic abd pain issues does not know GI doc's name  rec Prednisone 10 mg take  4 each am x 2 days,   2 each am x 2 days,  1 each am x 2 days and stop  Stop losartan, clariton, benadryl, allegra, fish oil  - call me if blood pressure goes back up rather than restarting losartan  For drainage / throat tickle try take CHLORPHENIRAMINE  4 mg Prilosec 40 mg Take 30- 60 min before your first and last meals of the day  GERD diet  Please schedule a follow up office visit in 2 weeks, call sooner if needed with all medications     12/15/2019  f/u ov/Danielle Richard re:  Cough x 2019 worse on ACEi /  Did not bring meds / daughter helping with them Chief Complaint  Patient presents with  . Follow-up    cxr repeated today. Cough is some better but has not resolved. Mainly occurs at night-prod with yellow sputum.   Dyspnea:  Fine  Cough: after supper  Sleeping: once asleep no resp symptoms p one h1 taken one hour before hs  SABA use: none  02: none Sleeping p h1  without nocturnal  or early am exacerbation rec Try off prilosec and use Pepcid 20 mg one after breakfast and supper (over the counter) For  drainage / throat tickle try take CHLORPHENIRAMINE  4 mg    Ok to resume losartan  We will call to schedule ct chest  > c/w MAI  We will need Dr Laurey Morale last office note  Please schedule a follow up office visit in 2 weeks,   01/11/2020  f/u ov/Danielle Richard re:  Chronic cough [name] on hrct Chief Complaint  Patient presents with  . Follow-up    1 month f/u. Per daughter, her cough has improved since last visit.   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 Follow-up    Still has occ cough at hs- non prod.   Dyspnea:  Not limited by breathing from desired activities   Cough: minimal dry/ one chlor at hs and snot sleeping well (has not tried 2 at hs per recs) Sleeping: flat with pillows under head SABA use: none  02: none    No obvious day to day or daytime variability or assoc excess/ purulent sputum or mucus plugs or hemoptysis or cp or chest tightness, subjective wheeze or overt sinus or hb symptoms.    Also denies any obvious fluctuation of symptoms with weather or environmental changes or other aggravating or alleviating factors except as outlined above    No unusual exposure hx or h/o childhood pna/ asthma or knowledge of premature birth.  Current Allergies, Complete Past Medical History, Past Surgical History, Family History, and Social History were reviewed in [employer] record.  ROS  The following are not active complaints unless bolded Hoarseness, sore throat, dysphagia, dental problems, itching, sneezing,  nasal congestion or discharge of excess mucus or purulent secretions, ear ache,   fever, chills, sweats, unintended wt loss or wt gain, classically pleuritic or exertional cp,  orthopnea pnd or arm/hand swelling  or leg swelling, presyncope, palpitations, abdominal pain, anorexia, nausea, vomiting, diarrhea  or change in bowel habits or change in bladder habits, change in stools or change in urine, dysuria, hematuria,  rash, arthralgias, visual complaints, headache, numbness, weakness or ataxia or problems with walking or coordination,  change in mood or  memory.        Current Meds  Medication Sig  . atorvastatin (LIPITOR) 40 MG tablet Take 1 tablet (40 mg total) by mouth daily.  . Blood Glucose Monitoring Suppl (TRUE METRIX METER) DEVI 1 each by Does not apply route 3 (three) times daily before meals.  . citalopram (CELEXA) 20 MG tablet Take 1 tablet (20 mg total) by mouth every morning.  . dicyclomine (BENTYL) 20 MG tablet Take 20 mg by mouth 4 (four) times daily -  before meals and at bedtime.   . famotidine (PEPCID) 20 MG tablet One after breakfast and supper  . fexofenadine (ALLEGRA) 180 MG tablet Take 180 mg by mouth as needed.   Marland Kitchen glucose blood (TRUE METRIX BLOOD GLUCOSE TEST) test strip Use 3 times daily before meals  . latanoprost (XALATAN) 0.005 % ophthalmic solution PLACE ONE DROP INTO BOTH EYES AT BEDTIME  . metFORMIN (GLUCOPHAGE) 500 MG tablet TAKE 1 TABLET (500 MG TOTAL) BY MOUTH 2 TIMES DAILY WITH A MEAL.  Marland Kitchen PROLIA 60 MG/ML SOSY injection INJECT 60 MG Q 6 MONTHS  . sucralfate (CARAFATE) 1 g tablet  Take 1 tablet by mouth. On an empty stomach three times daily before meals  . traZODone (DESYREL) 100 MG tablet Take 1 tablet (100 mg total) by mouth at bedtime.  . triamterene (DYRENIUM) 50 MG capsule Take 50 mg by mouth daily.  . TRUEPLUS LANCETS 28G MISC 1 each by Does not apply route 3 (three) times daily before meals.  Marland Kitchen VITAMIN D PO Take 1 tablet  by mouth daily.            Past Medical History:  Diagnosis Date  . Aortic insufficiency    pt unaware of this  . Depression   . Diabetes mellitus, type II (HCC)   . Dizziness    chronic for 10 years and mild  . GERD (gastroesophageal reflux disease)   . H/O mammogram 2016   normal  . History of nuclear stress test 10/16/2017   negative for ischemia  . HLD (hyperlipidemia)   . Hx of colonoscopy 2015   repeat 5 years  . Hypertension   . Insomnia   . Personal history of colonic polyps   . PUD (peptic ulcer disease)          Objective:      08/09/2020          109  05/04/2020        108   01/11/2020         108   12/15/19 110 lb (49.9 kg)  11/30/19 108 lb (49 kg)  07/13/19 108 lb (49 kg)     Vital signs reviewed  08/09/2020  - Note at rest 02 sats  98% on RA   General appearance:    Thin pleasant amb vietnamese female nad   HEENT : pt wearing mask not removed for exam due to covid -19 concerns.    NECK :  without JVD/Nodes/TM/ nl carotid upstrokes bilaterally   LUNGS: no acc muscle use,  Nl contour chest which is clear to A and P bilaterally without cough on insp or exp maneuvers   CV:  RRR  no s3 or murmur or increase in P2, and no edema   ABD:  soft and nontender with nl inspiratory excursion in the supine position. No bruits or organomegaly appreciated, bowel sounds nl  MS:  Nl gait/ ext warm without deformities, calf tenderness, cyanosis or clubbing No obvious joint restrictions   SKIN: warm and dry without lesions    NEURO:  alert, approp, nl sensorium with  no motor or cerebellar deficits apparent.       CXR PA and Lateral:   08/09/2020 :    I personally reviewed images and agree with radiology impression as follows:   No acute abnormality noted My review: no progression of dz      Assessment

## 2020-08-09 NOTE — Patient Instructions (Signed)
Ok to take chlorpheniramine 4 mg x 1  Or  2 one hour before bed if trouble sleeping from cough or any reason.   Please remember to go to the  x-ray department  for your tests - we will call you with the results when they are available    Please schedule a follow up visit in 6  months but call sooner if needed

## 2020-08-10 ENCOUNTER — Encounter: Payer: Self-pay | Admitting: Internal Medicine

## 2020-08-10 NOTE — Assessment & Plan Note (Signed)
Onset ? 2019 while on ACEi / partially responds to prednisone -  Allergy profile 11/30/2019 >  Eos 0.1 /  IgE  7  Rast pos dust/ cockroach -  11/30/2019   Max rx for gerd/ 1st gen H1 blockers per guidelines  / trial off losartan   > no change 12/15/2019 so rec restart  - Sinus CT 12/29/19 :  1. Mucosal thickening throughout the ethmoid air cells bilaterally. Mild mucosal thickening at several other sites. No air-fluid level. No bony destruction or expansion. Ostiomeatal unit complexes are patent bilaterally. 2.  Rightward deviation of nasal septum.  No nares obstruction  Reviewed instructions for 1st gen H1 blockers per guidelines  At hs can try 2 plus pepcid           Each maintenance medication was reviewed in detail including emphasizing most importantly the difference between maintenance and prns and under what circumstances the prns are to [place] triggered using an action plan format where appropriate.  Total time for H and P, chart review, counseling thru daughter, RN   and generating customized AVS unique to this office visit / charting = 20 min

## 2020-08-10 NOTE — Assessment & Plan Note (Signed)
Never smoker 1st noted 11/30/19 and new since 08/09/16  > no change 12/15/2019  - CT chest 12/29/2019 1. 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 01/11/2020 rec levaquin 500 x  1 week>  mild R hand ?tendonitis so stopped p a week but much better clinically and radiographically 05/04/2020  - 08/09/2020 no progression of dz off abx since 01/2020   MAI  is an extremely common benign condition in the elderly and does not warrant aggressive eval/ rx at this point unless there is a clinical correlation suggesting unaddressed pulmonary infection (purulent sputum, night sweats, unintended wt loss, doe) or evolution of  obvious changes on plain cxr (as opposed to serial CT, which is way over sensitive to make clinical decisions re intervention and treatment in the elderly, who tend to tolerate both dx and treatment poorly) .  Clinically and radiographically doing well > f/u 6 months

## 2020-08-11 DIAGNOSIS — H401222 Low-tension glaucoma, left eye, moderate stage: Secondary | ICD-10-CM | POA: Diagnosis not present

## 2020-08-11 DIAGNOSIS — H2513 Age-related nuclear cataract, bilateral: Secondary | ICD-10-CM | POA: Diagnosis not present

## 2020-08-11 DIAGNOSIS — H40033 Anatomical narrow angle, bilateral: Secondary | ICD-10-CM | POA: Diagnosis not present

## 2020-08-11 DIAGNOSIS — H401211 Low-tension glaucoma, right eye, mild stage: Secondary | ICD-10-CM | POA: Diagnosis not present

## 2020-10-20 DIAGNOSIS — M810 Age-related osteoporosis without current pathological fracture: Secondary | ICD-10-CM | POA: Diagnosis not present

## 2020-12-13 ENCOUNTER — Encounter: Payer: Self-pay | Admitting: Internal Medicine

## 2020-12-13 ENCOUNTER — Other Ambulatory Visit: Payer: Self-pay

## 2020-12-13 ENCOUNTER — Ambulatory Visit (INDEPENDENT_AMBULATORY_CARE_PROVIDER_SITE_OTHER): Payer: Medicare Other | Admitting: Internal Medicine

## 2020-12-13 ENCOUNTER — Ambulatory Visit (INDEPENDENT_AMBULATORY_CARE_PROVIDER_SITE_OTHER): Payer: Medicare Other

## 2020-12-13 DIAGNOSIS — R058 Other specified cough: Secondary | ICD-10-CM

## 2020-12-13 DIAGNOSIS — R059 Cough, unspecified: Secondary | ICD-10-CM | POA: Diagnosis not present

## 2020-12-13 MED ORDER — PANTOPRAZOLE SODIUM 40 MG PO TBEC: 40.0000 mg | DELAYED_RELEASE_TABLET | Freq: Every day | ORAL | 2 refills | Status: DC

## 2020-12-13 MED ORDER — PREDNISONE 10 MG PO TABS: ORAL_TABLET | ORAL | 0 refills | Status: DC

## 2020-12-13 NOTE — Progress Notes (Signed)
Danielle Richard, female    DOB: [birth year]  MRN: [id number]   Brief patient profile:  70 yo [name] female never smoker with cough x 2019 made worse on ACEi stopped fall of 2019  And waxed and waned since better on prednisone for several weeks then recurred so referred to pulmonary clinic 11/30/2019 by Dr   Pearson Grippe    History of Present Illness  11/30/2019  Pulmonary/ 1st office eval/[name]  Chief Complaint  Patient presents with  . Pulmonary Consult    Referred by Dr Pearson Grippe.  Pt c/o cough for the past year- occ prod with yellow sputum. Cough seems worse at night.  Dyspnea:  Only if coughing  Cough: min  productive, usually all day long but esp late afternoon before supper and at hs but  Then quiets down and does not typically wake her up  Sleep: flat bed/ one pillow SABA use: not using  Overt HB/ chronic abd pain issues does not know GI doc's name  rec Prednisone 10 mg take  4 each am x 2 days,   2 each am x 2 days,  1 each am x 2 days and stop  Stop losartan, clariton, benadryl, allegra, fish oil  - call me if blood pressure goes back up rather than restarting losartan  For drainage / throat tickle try take CHLORPHENIRAMINE  4 mg Prilosec 40 mg Take 30- 60 min before your first and last meals of the day  GERD diet  Please schedule a follow up office visit in 2 weeks, call sooner if needed with all medications     12/15/2019  f/u ov/Danielle Richard re:  Cough x 2019 worse on ACEi /  Did not bring meds / daughter helping with them Chief Complaint  Patient presents with  . Follow-up    cxr repeated today. Cough is some better but has not resolved. Mainly occurs at night-prod with yellow sputum.   Dyspnea:  Fine  Cough: after supper  Sleeping: once asleep no resp symptoms p one h1 taken one hour before hs  SABA use: none  02: none Sleeping p h1  without nocturnal  or early am exacerbation rec Try off prilosec and use Pepcid 20 mg one after breakfast and supper (over the counter) For  drainage / throat tickle try take CHLORPHENIRAMINE  4 mg    Ok to resume losartan  We will call to schedule ct chest  > c/w MAI  We will need Dr Laurey Morale last office note  Please schedule a follow up office visit in 2 weeks,   01/11/2020  f/u ov/Danielle Richard re:  Chronic cough [name] on hrct Chief Complaint  Patient presents with  . Follow-up    1 month f/u. Per daughter, her cough has improved since last visit.   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 Follow-up  Still has occ cough at hs- non prod.   Dyspnea:  Not limited by breathing from desired activities   Cough: minimal dry/ one chlor at hs and not sleeping well (has not tried 2 at hs per recs) Sleeping: flat with pillows under head SABA use: none  02: none  rec Ok to take chlorpheniramine 4 mg x 1  Or  2 one hour before bed if trouble sleeping from cough or any reason.   12/13/2020  Acute  ov/Danielle Richard re:  MAI on HRCT / cough was much better until 2 weeks prior to OV  / not sure about meds, takes them on her own and did not bring them with her. Chief Complaint  Patient presents with  . Acute Visit    Dry cough started 2 weeks ago.   Dyspnea:  None Cough: dry / worse at hs / bed is flat with 2 pillows - just using h1 hs, not daytime, using allegra instead  Sleeping: poorly due to cough SABA use: none  02: none      No obvious day to day or daytime variability or assoc excess/ purulent sputum or mucus plugs or hemoptysis or cp or chest  tightness, subjective wheeze or overt sinus or hb symptoms.     Also denies any obvious fluctuation of symptoms with weather or environmental changes or other aggravating or alleviating factors except as outlined above   No unusual exposure hx or h/o childhood pna/ asthma or knowledge of premature birth.  Current Allergies, Complete Past Medical History, Past Surgical History, Family History, and Social History were reviewed in [employer] record.  ROS  The following are not active complaints unless bolded Hoarseness, sore throat"from coughing" , dysphagia, dental problems, itching, sneezing,  nasal congestion or discharge of excess mucus or purulent secretions, ear ache,   fever, chills, sweats, unintended wt loss or wt gain, classically pleuritic or exertional cp,  orthopnea pnd or arm/hand swelling  or leg swelling, presyncope, palpitations, abdominal pain, anorexia, nausea, vomiting, diarrhea  or change in bowel habits or change in bladder habits, change in stools or change in urine, dysuria, hematuria,  rash, arthralgias, visual complaints, headache, numbness, weakness or ataxia or problems with walking or coordination,  change in mood or  memory.        Current Meds  Medication Sig  . atorvastatin (LIPITOR) 40 MG tablet Take 1 tablet (40 mg total) by mouth daily.  . Blood Glucose Monitoring Suppl (TRUE METRIX METER) DEVI 1 each by Does not apply route 3 (three) times daily before meals.  . citalopram (CELEXA) 20 MG tablet Take 1 tablet (20 mg total) by mouth every morning.  . dicyclomine (BENTYL) 20 MG tablet Take 20 mg by mouth 4 (four) times daily -  before meals and at bedtime.   . famotidine (PEPCID) 20 MG tablet One after breakfast and supper  . fexofenadine (ALLEGRA) 180 MG tablet Take 180 mg by mouth as needed.   Marland Kitchen glucose blood (TRUE METRIX BLOOD GLUCOSE TEST) test strip Use 3 times daily before meals  . latanoprost (XALATAN) 0.005 % ophthalmic solution PLACE  ONE DROP INTO BOTH EYES AT BEDTIME  . metFORMIN (GLUCOPHAGE) 500 MG tablet TAKE 1 TABLET (500 MG TOTAL) BY MOUTH 2 TIMES DAILY WITH A MEAL.  Marland Kitchen PROLIA 60 MG/ML SOSY injection INJECT 60 MG Q 6 MONTHS  . sucralfate (CARAFATE) 1 g tablet Take 1 tablet by mouth. On an empty stomach three times daily before meals  .  traZODone (DESYREL) 100 MG tablet Take 1 tablet (100 mg total) by mouth at bedtime.  . triamterene (DYRENIUM) 50 MG capsule Take 50 mg by mouth daily.  . TRUEPLUS LANCETS 28G MISC 1 each by Does not apply route 3 (three) times daily before meals.  Marland Kitchen VITAMIN D PO Take 1 tablet by mouth daily.            Past Medical History:  Diagnosis Date  . Aortic insufficiency    pt unaware of this  . Depression   . Diabetes mellitus, type II (HCC)   . Dizziness    chronic for 10 years and mild  . GERD (gastroesophageal reflux disease)   . H/O mammogram 2016   normal  . History of nuclear stress test 10/16/2017   negative for ischemia  . HLD (hyperlipidemia)   . Hx of colonoscopy 2015   repeat 5 years  . Hypertension   . Insomnia   . Personal history of colonic polyps   . PUD (peptic ulcer disease)          Objective:     12/13/2020        107  08/09/2020          109  05/04/2020        108   01/11/2020         108   12/15/19 110 lb (49.9 kg)  11/30/19 108 lb (49 kg)  07/13/19 108 lb (49 kg)    Vital signs reviewed  12/13/2020  - Note at rest 02 sats  100% on RA   General appearance:    [place] asian female, freq throat clearing,no cough on insp/exp/ daughter serving as Nurse, learning disability    HEENT : pt wearing mask not removed for exam due to covid -19 concerns.    NECK :  without JVD/Nodes/TM/ nl carotid upstrokes bilaterally   LUNGS: no acc muscle use,  Nl contour chest which is clear to A and P bilaterally without cough on insp or exp maneuvers   CV:  RRR  no s3 or murmur or increase in P2, and no edema   ABD:  soft and nontender with nl inspiratory excursion in the supine  position. No bruits or organomegaly appreciated, bowel sounds nl  MS:  Nl gait/ ext warm without deformities, calf tenderness, cyanosis or clubbing No obvious joint restrictions   SKIN: warm and dry without lesions    NEURO:  alert, approp, nl sensorium with  no motor or cerebellar deficits apparent.    CXR PA and Lateral:   12/13/2020 :    I personally reviewed images / impression as follows:   Mild T kyphosis, no acute changes         Assessment

## 2020-12-13 NOTE — Patient Instructions (Addendum)
Switch to Pantoprazole (protonix) 40 mg Drink 30-60 minutes before the first meal of the day and Pepcid (famotidine) 20 mg after dinner until returning to the office - this is the best way to know if stomach acid contributes to your problem.   Stop the allegra now and To drain/feel the tickle in the throat, try taking CHLORPHENIRAMINE 4 mg (Chlortab 4mg  at [facility] should Porshe easiest to find in the green box) take up to one or two hours every 4 hours if needed    GERD (REFLUX)  is an extremely common cause of respiratory symptoms just like yours , many times with no obvious heartburn at all.    It can Lucrezia treated with medication, but also with lifestyle changes including elevation of the head of your bed (ideally with 6 -8inch blocks under the headboard of your bed),  Smoking cessation, avoidance of late meals, excessive alcohol, and avoid fatty foods, chocolate, peppermint, colas, red wine, and acidic juices such as orange juice.  NO MINT OR MENTHOL PRODUCTS SO NO COUGH DROPS  USE SUGARLESS CANDY INSTEAD (Jolley ranchers or Stover's or Life Savers) or even ice chips will also do - the key is to swallow to prevent all throat clearing. NO OIL BASED VITAMINS - use powdered substitutes.  Avoid fish oil when coughing.        Please schedule a re-visit at the office after 4 weeks with all medicines and hard candy      ??i sang Pantoprazole (protonix) 40 mg U?ng 30-60 pht tr??c b?a ?n ??u tin trong ngy v Pepcid (famotidine) 20 mg sau b?a ?n t?i cho ??n khi tr? l?i v?n phng - ?y l cch t?t nh?t ?? bi?t li?u axit d? dy c gp ph?n gy ra v?n ?? c?a b?n hay khng.  Hy d?ng thu?c gi?m ?au ngay by gi? v ?? thot n??c / c?m gic nh?t nh?t ? c? h?ng, hy th? dng CHLORPHENIRAMINE 4 mg (Chlortab 4mg  t?i [facility] nn d? tm th?y nh?t trong h?p mu xanh l cy) U?ng t?i ?a m?t hai ti?ng sau m?i 4 gi? n?u c?n  GERD (REFLUX) l m?t nguyn nhn c?c k? ph? bi?n gy ra cc tri?u ch?ng  v? ???ng h h?p gi?ng nh? c?a b?n, nhi?u l?n m khng c hi?n t??ng ? chua r rng.   N c th? ???c ?i?u tr? b?ng thu?c, nh?ng c?ng c th? thay ??i l?i s?ng bao g?m k cao ??u gi??ng c?a b?n (l t??ng l k cc kh?i 6-8 inch d??i ??u gi??ng c?a b?n), ng?ng ht thu?c, trnh ?n khuya, u?ng qu nhi?u r??u v trnh th?c ?n bo. , s c la, b?c h, cola, r??u vang ?? v n??c tri cy c tnh axit nh? n??c cam. KHNG C MINT HO?C CC S?N PH?M MENTHOL V V?Y [facility] QUA DROPS S? D?NG K?O D?O C ???NG (Jolley ra farmhers ho?c Stover's ho?c Life Savers) ho?c th?m ch ? bo c?ng c th? lm ???c - ?i?u quan tr?ng l b?n ph?i nu?t ?? trnh h?t h?ng gi?ng. KHNG C VITAMIN B?NG D?U - s? d?ng ch?t thay th? d?ng b?t. d?u c khi b? ho.   Hy nh? ??n phng ch?p x-quang ?? lm xt nghi?m - chng ti s? g?i cho b?n ?? cung c?p k?t qu? khi c k?t qu?  Vui lng ln l?ch ti khm t?i v?n phng sau 4 tu?n v?i t?t c? cc lo?i thu?c v k?o c?ng

## 2020-12-14 ENCOUNTER — Encounter: Payer: Self-pay | Admitting: Internal Medicine

## 2020-12-14 NOTE — Assessment & Plan Note (Signed)
Onset ? 2019 while on ACEi / partially responds to prednisone -  Allergy profile 11/30/2019 >  Eos 0.1 /  IgE  7  Rast pos dust/ cockroach -  11/30/2019   Max rx for gerd/ 1st gen H1 blockers per guidelines  / trial off losartan   > no change 12/15/2019 so rec restart  - Sinus CT 12/29/19 :  1. Mucosal thickening throughout the ethmoid air cells bilaterally. Mild mucosal thickening at several other sites. No air-fluid level.  2.  Rightward deviation of nasal septum.  No nares obstruction  Off max gerd rx >>> Recurrent Upper airway cough syndrome (previously labeled PNDS),  is so named because it's frequently impossible to sort out how much is  CR/sinusitis with freq throat clearing (which can Takela related to primary GERD)   vs  causing  secondary (" extra esophageal")  GERD from wide swings in gastric pressure that occur with throat clearing, often  promoting self use of mint and menthol lozenges that reduce the lower esophageal sphincter tone and exacerbate the problem further in a cyclical fashion.   These are the same pts (now being labeled as having "irritable larynx syndrome" by some cough centers) who not infrequently have a history of having failed to tolerate ace inhibitors,  dry powder inhalers or biphosphonates or report having atypical/extraesophageal reflux symptoms that don't respond to standard doses of PPI  and are easily confused as having aecopd or asthma flares by even experienced allergists/ pulmonologists (myself included).   rec  Max rx for GERD/ pnds with daytime 1st gen H1 blockers per guidelines  And f/u in 4 weeks with all meds in hand using a trust but verify approach to confirm accurate Medication  Reconciliation The principal here is that until we are certain that the  patients are doing what we've asked, it makes no sense to ask them to do more.           Each maintenance medication was reviewed in detail including emphasizing most importantly the difference between  maintenance and prns and under what circumstances the prns are to [place] triggered using an action plan format where appropriate.  Total time for H and P, chart review, counseling, and generating customized AVS (with vietnamese version thru Environmental manager)  unique to this office visit / same day charting = 43 min

## 2020-12-15 ENCOUNTER — Encounter: Payer: Self-pay | Admitting: *Deleted

## 2020-12-15 ENCOUNTER — Telehealth: Payer: Self-pay | Admitting: Internal Medicine

## 2020-12-15 NOTE — Telephone Encounter (Signed)
See MyChart message from today.

## 2020-12-15 NOTE — Telephone Encounter (Signed)
Patient's daughter attached pictures of the medications the patient has been taking at home.

## 2020-12-19 DIAGNOSIS — I10 Essential (primary) hypertension: Secondary | ICD-10-CM | POA: Diagnosis not present

## 2020-12-19 DIAGNOSIS — E785 Hyperlipidemia, unspecified: Secondary | ICD-10-CM | POA: Diagnosis not present

## 2020-12-19 DIAGNOSIS — Z0000 Encounter for general adult medical examination without abnormal findings: Secondary | ICD-10-CM | POA: Diagnosis not present

## 2020-12-19 DIAGNOSIS — R232 Flushing: Secondary | ICD-10-CM | POA: Diagnosis not present

## 2020-12-19 DIAGNOSIS — E119 Type 2 diabetes mellitus without complications: Secondary | ICD-10-CM | POA: Diagnosis not present

## 2020-12-19 DIAGNOSIS — R059 Cough, unspecified: Secondary | ICD-10-CM | POA: Diagnosis not present

## 2020-12-19 DIAGNOSIS — R143 Flatulence: Secondary | ICD-10-CM | POA: Diagnosis not present

## 2020-12-19 DIAGNOSIS — R140 Abdominal distension (gaseous): Secondary | ICD-10-CM | POA: Diagnosis not present

## 2020-12-26 DIAGNOSIS — I10 Essential (primary) hypertension: Secondary | ICD-10-CM | POA: Diagnosis not present

## 2020-12-26 DIAGNOSIS — E785 Hyperlipidemia, unspecified: Secondary | ICD-10-CM | POA: Diagnosis not present

## 2020-12-26 DIAGNOSIS — M25511 Pain in right shoulder: Secondary | ICD-10-CM | POA: Diagnosis not present

## 2020-12-26 DIAGNOSIS — E119 Type 2 diabetes mellitus without complications: Secondary | ICD-10-CM | POA: Diagnosis not present

## 2020-12-26 DIAGNOSIS — R059 Cough, unspecified: Secondary | ICD-10-CM | POA: Diagnosis not present

## 2021-01-02 DIAGNOSIS — R140 Abdominal distension (gaseous): Secondary | ICD-10-CM | POA: Diagnosis not present

## 2021-01-09 DIAGNOSIS — Z23 Encounter for immunization: Secondary | ICD-10-CM | POA: Diagnosis not present

## 2021-01-10 ENCOUNTER — Ambulatory Visit (INDEPENDENT_AMBULATORY_CARE_PROVIDER_SITE_OTHER): Payer: Medicare Other | Admitting: Internal Medicine

## 2021-01-10 ENCOUNTER — Encounter: Payer: Self-pay | Admitting: Internal Medicine

## 2021-01-10 ENCOUNTER — Other Ambulatory Visit: Payer: Self-pay

## 2021-01-10 DIAGNOSIS — R058 Other specified cough: Secondary | ICD-10-CM | POA: Diagnosis not present

## 2021-01-10 MED ORDER — GABAPENTIN 100 MG PO CAPS: 100.0000 mg | ORAL_CAPSULE | Freq: Four times a day (QID) | ORAL | 2 refills | Status: DC

## 2021-01-10 MED ORDER — PREDNISONE 10 MG PO TABS: ORAL_TABLET | ORAL | 0 refills | Status: DC

## 2021-01-10 MED ORDER — BENZONATATE 200 MG PO CAPS: 200.0000 mg | ORAL_CAPSULE | Freq: Three times a day (TID) | ORAL | 1 refills | Status: DC | PRN

## 2021-01-10 NOTE — Patient Instructions (Addendum)
Stop allegra >> For drainage/itching  / throat tickle try take CHLORPHENIRAMINE  4 mg  (Chlortab 4mg   at should [place] easiest to find in the green box)  take one every 4 hours as needed - available over the counter- may cause drowsiness so start with just a dose or two an hour before bedtime and see how you tolerate it before trying in daytime    Prednisone 10 mg take  4 each am x 2 days,   2 each am x 2 days,  1 each am x 2 days and stop    For cough > tessalon pearls 200 mg every 6 hours as needed   Gabapentin 100 mg four times daily starting with just one at bedtime and building up over several weeks  Stop fish oil and keep the candy handy   If not improving call for referral to Dr [employer] at [facility]    Please schedule a follow up visit in 3 months but call sooner if needed

## 2021-01-10 NOTE — Progress Notes (Signed)
Danielle Richard, female    DOB: 1951/05/28  MRN: [id number]   Brief patient profile:  71 yo [name] female never smoker with cough x 2019 made worse on ACEi stopped fall of 2019  And waxed and waned since better on prednisone for several weeks then recurred so referred to pulmonary clinic 11/30/2019 by Dr   Pearson Grippe    History of Present Illness  11/30/2019  Pulmonary/ 1st office eval/Danielle Richard  Chief Complaint  Patient presents with  . Pulmonary Consult    Referred by Dr Pearson Grippe.  Pt c/o cough for the past year- occ prod with yellow sputum. Cough seems worse at night.  Dyspnea:  Only if coughing  Cough: min  productive, usually all day long but esp late afternoon before supper and at hs but  Then quiets down and does not typically wake her up  Sleep: flat bed/ one pillow SABA use: not using  Overt HB/ chronic abd pain issues does not know GI doc's name  rec Prednisone 10 mg take  4 each am x 2 days,   2 each am x 2 days,  1 each am x 2 days and stop  Stop losartan, clariton, benadryl, allegra, fish oil  - call me if blood pressure goes back up rather than restarting losartan  For drainage / throat tickle try take CHLORPHENIRAMINE  4 mg Prilosec 40 mg Take 30- 60 min before your first and last meals of the day  GERD diet  Please schedule a follow up office visit in 2 weeks, call sooner if needed with all medications     12/15/2019  f/u ov/Danielle Richard re:  Cough x 2019 worse on ACEi /  Did not bring meds / daughter helping with them Chief Complaint  Patient presents with  . Follow-up    cxr repeated today. Cough is some better but has not resolved. Mainly occurs at night-prod with yellow sputum.   Dyspnea:  Fine  Cough: after supper  Sleeping: once asleep no resp symptoms p one h1 taken one hour before hs  SABA use: none  02: none Sleeping p h1  without nocturnal  or early am exacerbation rec Try off prilosec and use Pepcid 20 mg one after breakfast and supper (over the counter) For  drainage / throat tickle try take CHLORPHENIRAMINE  4 mg    Ok to resume losartan  We will call to schedule ct chest  > c/w MAI  We will need Dr Laurey Morale last office note  Please schedule a follow up office visit in 2 weeks,   01/11/2020  f/u ov/Danielle Richard re:  Chronic cough [name] on hrct Chief Complaint  Patient presents with  . Follow-up    1 month f/u. Per daughter, her cough has improved since last visit.   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 Follow-up  Still has occ cough at hs- non prod.   Dyspnea:  Not limited by breathing from desired activities   Cough: minimal dry/ one chlor at hs and not sleeping well (has not tried 2 at hs per recs) Sleeping: flat with pillows under head SABA use: none  02: none  rec Ok to take chlorpheniramine 4 mg x 1  Or  2 one hour before bed if trouble sleeping from cough or any reason.   12/13/2020  Acute  ov/Danielle Richard re:  MAI on HRCT / cough was much better until 2 weeks prior to OV  / not sure about meds, takes them on her own and did not bring them with her. Chief Complaint  Patient presents with  . Acute Visit    Dry cough started 2 weeks ago.   Dyspnea:  None Cough: dry / worse at hs / bed is flat with 2 pillows - just using h1 hs, not daytime, using allegra instead  Sleeping: poorly due to cough SABA use: none  02: none  rec Switch to Pantoprazole (protonix) 40 mg Drink 30-60 minutes before the first meal of the day and Pepcid (famotidine) 20 mg after  dinner    Stop the allegra now and To drain/feel the tickle in the throat, try taking CHLORPHENIRAMINE 4 mg   GERD diet.  Please schedule a re-visit at the office after 4 weeks with all medicines and hard candy   01/10/2021  f/u ov/Danielle Richard re:  MAI/ chronic cough on 1st gen H1 blockers per guidelines  For uacs and max ppi  Chief Complaint  Patient presents with  . Follow-up    Cough has improved some during the day, still bothers her at night and is non prod.    Dyspnea:  none Cough: dry raspy / better p h1 but only using 2 at hs and does not make her sleepy - still using allegra daytime Sleeping: bed blocks SABA use: none  02: none  Covid status:   vax x 4    No obvious day to day or daytime variability or assoc excess/ purulent sputum or mucus plugs or hemoptysis or cp or chest tightness, subjective wheeze or overt sinus or hb symptoms.    . Also denies any obvious fluctuation of symptoms with weather or environmental changes or other aggravating or alleviating factors except as outlined above   No unusual exposure hx or h/o childhood pna/ asthma or knowledge of premature birth.  Current Allergies, Complete Past Medical History, Past Surgical History, Family History, and Social History were reviewed in [employer] record.  ROS  The following are not active complaints unless bolded Hoarseness, sore throat, dysphagia, dental problems, itching, sneezing,  nasal congestion or discharge of excess mucus or purulent secretions, ear ache,   fever, chills, sweats, unintended wt loss or wt gain, classically pleuritic or exertional cp,  orthopnea pnd or arm/hand swelling  or leg swelling, presyncope, palpitations, abdominal pain, anorexia, nausea, vomiting, diarrhea  or change in bowel habits or change in bladder habits, change in stools or change in urine, dysuria, hematuria,  rash, arthralgias, visual complaints, headache, numbness, weakness or ataxia or problems with walking  or coordination,  change in mood or  memory.        Current Meds  Medication Sig  . atorvastatin (LIPITOR) 40 MG tablet Take 1 tablet (40 mg total) by mouth daily.  . Blood Glucose Monitoring Suppl (TRUE METRIX METER) DEVI 1 each by Does not apply route 3 (  three) times daily before meals.  . chlorpheniramine (CHLOR-TRIMETON) 4 MG tablet Take 4 mg by mouth every 4 (four) hours as needed for allergies.  . citalopram (CELEXA) 20 MG tablet Take 1 tablet (20 mg total) by mouth every morning.  . dicyclomine (BENTYL) 20 MG tablet Take 20 mg by mouth 4 (four) times daily -  before meals and at bedtime.   . fexofenadine (ALLEGRA) 180 MG tablet Take 180 mg by mouth as needed.   Marland Kitchen glucose blood (TRUE METRIX BLOOD GLUCOSE TEST) test strip Use 3 times daily before meals  . latanoprost (XALATAN) 0.005 % ophthalmic solution PLACE ONE DROP INTO BOTH EYES AT BEDTIME  . losartan (COZAAR) 25 MG tablet Take 25 mg by mouth daily.  . metFORMIN (GLUCOPHAGE) 500 MG tablet TAKE 1 TABLET (500 MG TOTAL) BY MOUTH 2 TIMES DAILY WITH A MEAL.  Marland Kitchen Omega-3 Fatty Acids (FISH OIL) 1000 MG CAPS Take 1 capsule by mouth daily.  Marland Kitchen omeprazole (PRILOSEC) 40 MG capsule Take 40 mg by mouth in the morning and at bedtime.  Marland Kitchen PROLIA 60 MG/ML SOSY injection INJECT 60 MG Q 6 MONTHS  . sucralfate (CARAFATE) 1 g tablet Take 1 tablet by mouth. On an empty stomach three times daily before meals  . traZODone (DESYREL) 100 MG tablet Take 1 tablet (100 mg total) by mouth at bedtime.  . triamterene (DYRENIUM) 50 MG capsule Take 50 mg by mouth daily.  . TRUEPLUS LANCETS 28G MISC 1 each by Does not apply route 3 (three) times daily before meals.  Marland Kitchen VITAMIN D PO Take 1 tablet by mouth daily.               Past Medical History:  Diagnosis Date  . Aortic insufficiency    pt unaware of this  . Depression   . Diabetes mellitus, type II (HCC)   . Dizziness    chronic for 10 years and mild  . GERD (gastroesophageal reflux disease)   . H/O  mammogram 2016   normal  . History of nuclear stress test 10/16/2017   negative for ischemia  . HLD (hyperlipidemia)   . Hx of colonoscopy 2015   repeat 5 years  . Hypertension   . Insomnia   . Personal history of colonic polyps   . PUD (peptic ulcer disease)          Objective:    01/10/2021          108  12/13/2020        107  08/09/2020          109  05/04/2020        108   01/11/2020         108   12/15/19 110 lb (49.9 kg)  11/30/19 108 lb (49 kg)  07/13/19 108 lb (49 kg)      Vital signs reviewed  01/10/2021  - Note at rest 02 sats  95% on RA   General appearance:    Thin asian female, mod hoarse, incessant throat clearing dry sounding    HEENT : pt wearing mask not removed for exam due to covid -19 concerns.    NECK :  without JVD/Nodes/TM/ nl carotid upstrokes bilaterally   LUNGS: no acc muscle use,  Nl contour chest which is clear to A and P bilaterally without cough on insp or exp maneuvers   CV:  RRR  no s3 or murmur or increase in P2, and no edema   ABD:  soft  and nontender with nl inspiratory excursion in the supine position. No bruits or organomegaly appreciated, bowel sounds nl  MS:  Nl gait/ ext warm without deformities, calf tenderness, cyanosis or clubbing No obvious joint restrictions   SKIN: warm and dry without lesions    NEURO:  alert, approp, nl sensorium with  no motor or cerebellar deficits apparent.               Assessment

## 2021-01-10 NOTE — Assessment & Plan Note (Addendum)
Onset ? 2019 while on ACEi / partially responds to prednisone -  Allergy profile 11/30/2019 >  Eos 0.1 /  IgE  7  Rast pos dust/ cockroach -  11/30/2019   Max rx for gerd/ 1st gen H1 blockers per guidelines  / trial off losartan   > no change 12/15/2019 so rec restart  - Sinus CT 12/29/19 :  1. Mucosal thickening throughout the ethmoid air cells bilaterally. Mild mucosal thickening at several other sites. No air-fluid level.  2.  Rightward deviation of nasal septum.  No nares obstruction - flare 11/2020 some better on h1 at hs but not using daytime - 01/10/2021 rec add gabapentin and increase daytime h1/ tessalon 200 prn and f/u with Dr Delford Field at [facility] if not improving   Of the three most common causes of  Sub-acute / recurrent or chronic cough, only one (GERD)  can actually contribute to/ trigger  the other two (asthma and post nasal drip syndrome)  and perpetuate the cylce of cough.  While not intuitively obvious, many patients with chronic low grade reflux do not cough until there is a primary insult that disturbs the protective epithelial barrier and exposes sensitive nerve endings.   This is typically viral but can due to PNDS and  either may apply here.   The point is that once this occurs, it is difficult to eliminate the cycle  using anything but a maximally effective acid suppression regimen at least in the short run, accompanied by an appropriate diet to address non acid GERD and control / eliminate the cough itself for at least 3 days with tessalon and add low dose gabapentin up to 100 mg qid to control longerm plus eliminate pnds with 1st gen H1 blockers per guidelines  >>> also so added 6 day taper off  Prednisone starting at 40 mg per day in case of component of Th-2 driven upper or lower airways inflammation (if cough responds short term only to relapse before return while will on full rx for uacs (as above), then  that would point to allergic rhinitis/ asthma or eos bronchitis as  alternative dx) .         Each maintenance medication was reviewed in detail including emphasizing most importantly the difference between maintenance and prns and under what circumstances the prns are to [place] triggered using an action plan format where appropriate.  Total time for H and P, chart review, counseling with daughter RN serving as Nurse, learning disability,  and generating customized AVS unique to this office visit / same day charting = 32 min

## 2021-02-07 ENCOUNTER — Ambulatory Visit: Payer: Medicare Other | Admitting: Internal Medicine

## 2021-02-14 DIAGNOSIS — H401211 Low-tension glaucoma, right eye, mild stage: Secondary | ICD-10-CM | POA: Diagnosis not present

## 2021-02-14 DIAGNOSIS — H401222 Low-tension glaucoma, left eye, moderate stage: Secondary | ICD-10-CM | POA: Diagnosis not present

## 2021-02-14 DIAGNOSIS — H40033 Anatomical narrow angle, bilateral: Secondary | ICD-10-CM | POA: Diagnosis not present

## 2021-04-04 ENCOUNTER — Other Ambulatory Visit: Payer: Self-pay | Admitting: Internal Medicine

## 2021-04-04 DIAGNOSIS — R058 Other specified cough: Secondary | ICD-10-CM

## 2021-04-12 ENCOUNTER — Ambulatory Visit: Payer: Medicare Other | Admitting: Internal Medicine

## 2021-04-27 DIAGNOSIS — M810 Age-related osteoporosis without current pathological fracture: Secondary | ICD-10-CM | POA: Diagnosis not present

## 2021-08-01 DIAGNOSIS — E785 Hyperlipidemia, unspecified: Secondary | ICD-10-CM | POA: Diagnosis not present

## 2021-08-01 DIAGNOSIS — E119 Type 2 diabetes mellitus without complications: Secondary | ICD-10-CM | POA: Diagnosis not present

## 2021-11-28 IMAGING — CT CT CHEST W/O CM
2 of 4 series · 11 of 36 positions shown, 13 images · non-contrast
Comparison: 12/15/2019 chest radiograph.

CLINICAL DATA: Chronic cough since 0409, intermittently productive,
worse at night.

EXAM:
CT CHEST WITHOUT CONTRAST
TECHNIQUE: Multidetector CT imaging of the chest was performed following the
standard protocol without IV contrast.

[Series 2: chest 2.00 br40 s3 · axial · 0.52mm/px · z∈[-937,-681]mm · 8 of 152 slices shown, 10 images (1 of 2)]
[im 12/152  mediastinal]
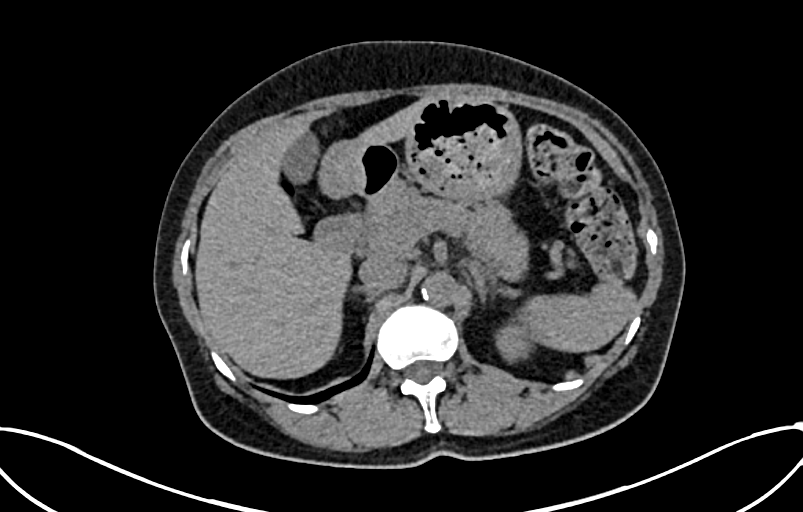
[im 12/152  lung]
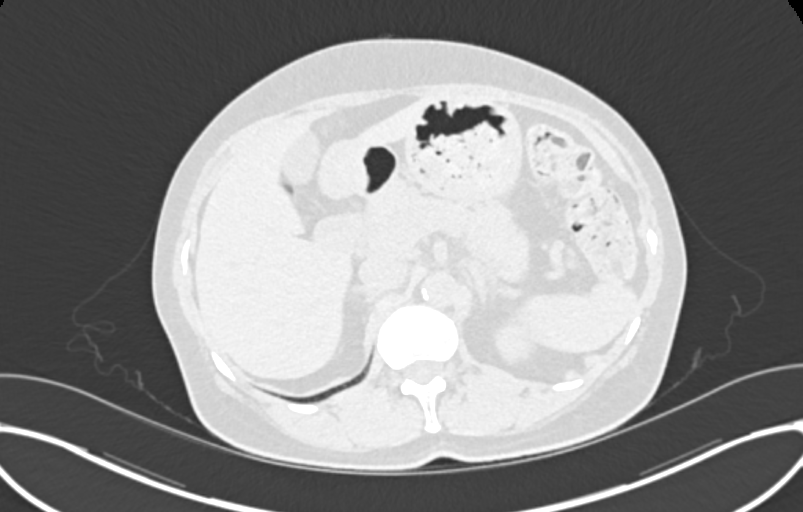
[im 35/152  lung]
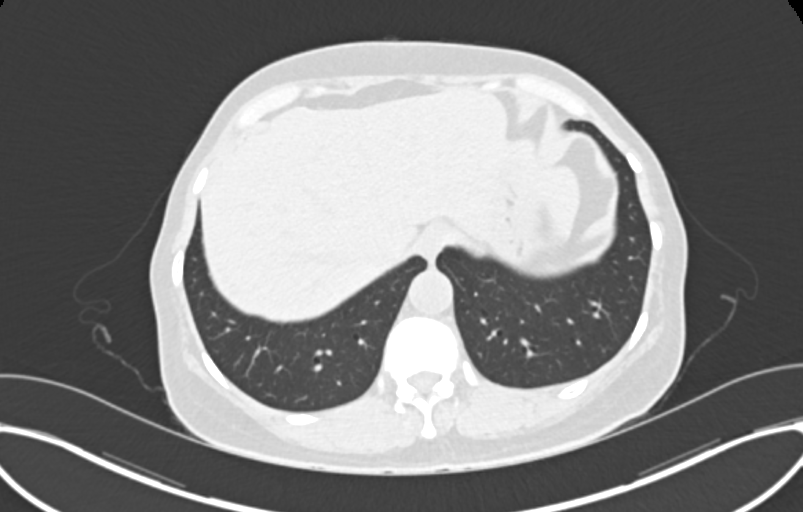
[im 47/152  lung]
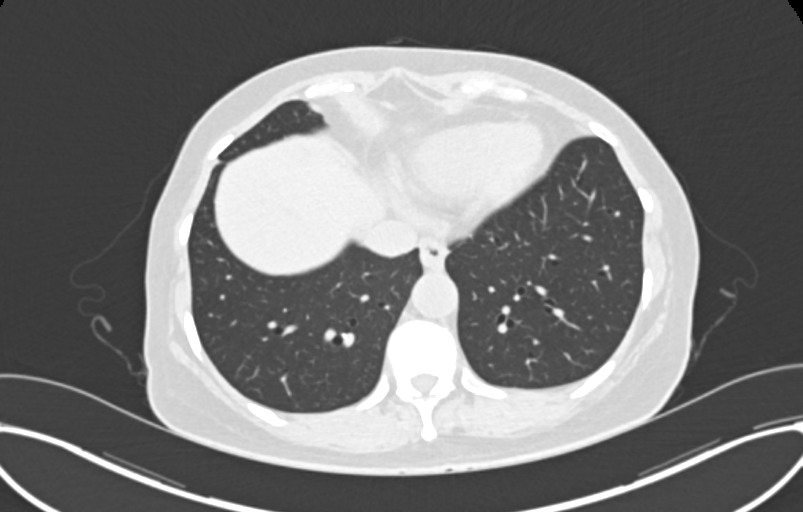
[im 70/152  lung]
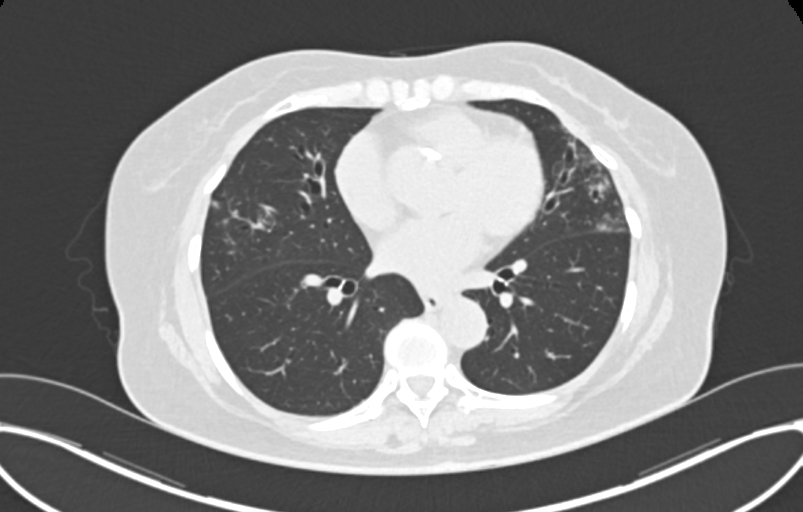
[im 82/152  mediastinal]
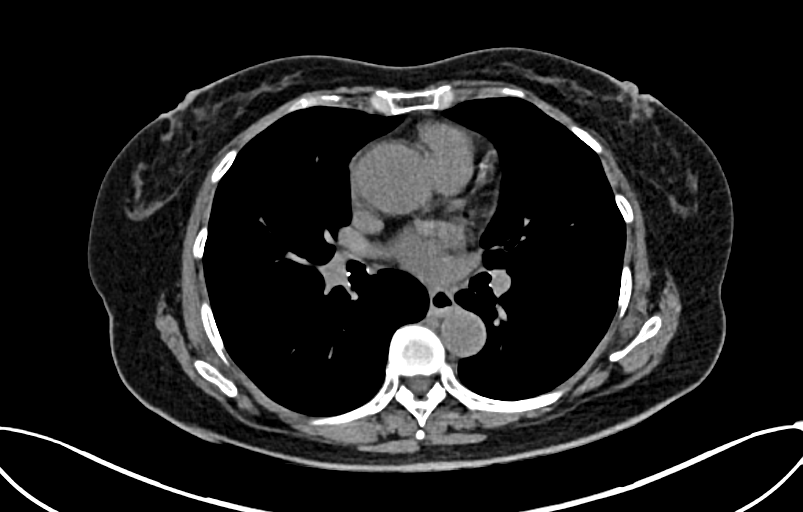
[im 82/152  lung]
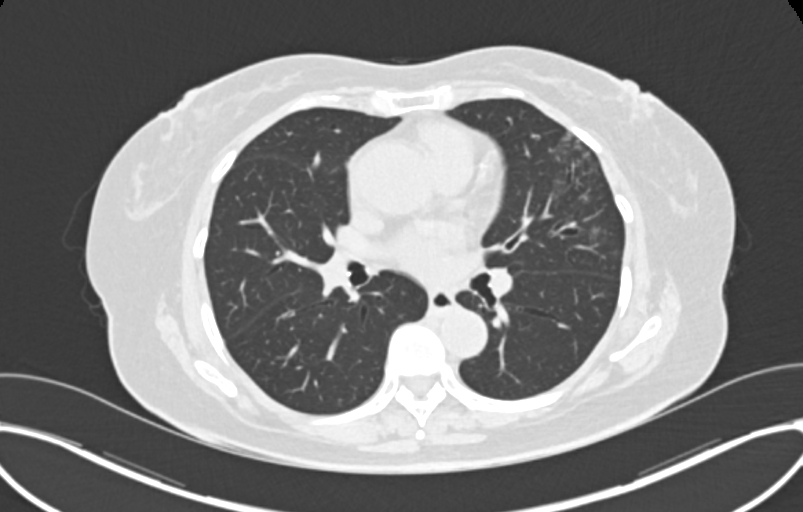
[im 105/152  lung]
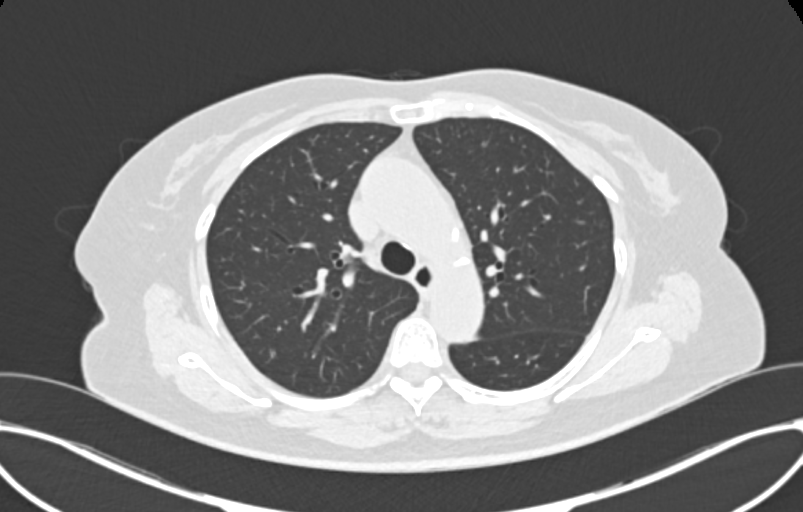
[im 117/152  lung]
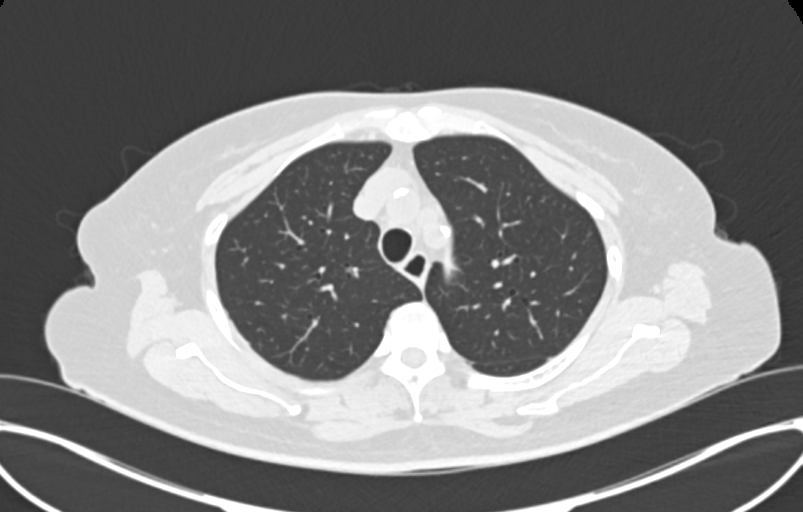
[im 140/152  lung]
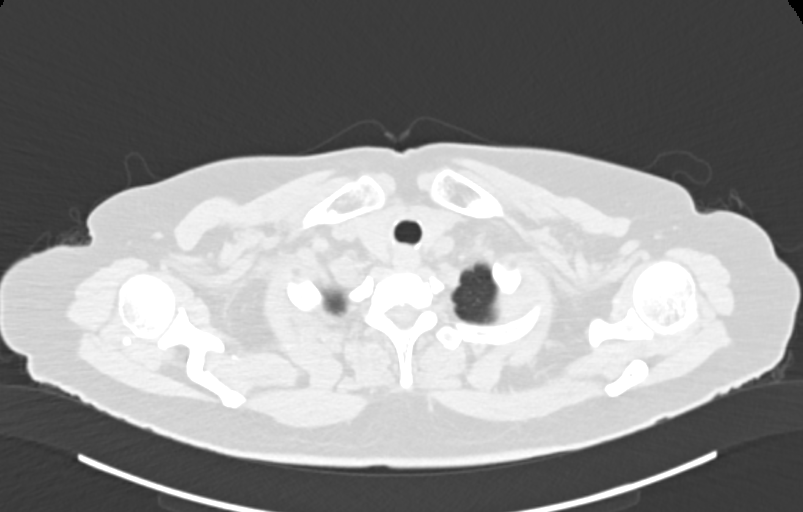

[Series 4: chest 2.00 br40 s3 · coronal · 0.60mm/px · 3 of 133 slices shown (2 of 2)]
[im 27/133  lung]
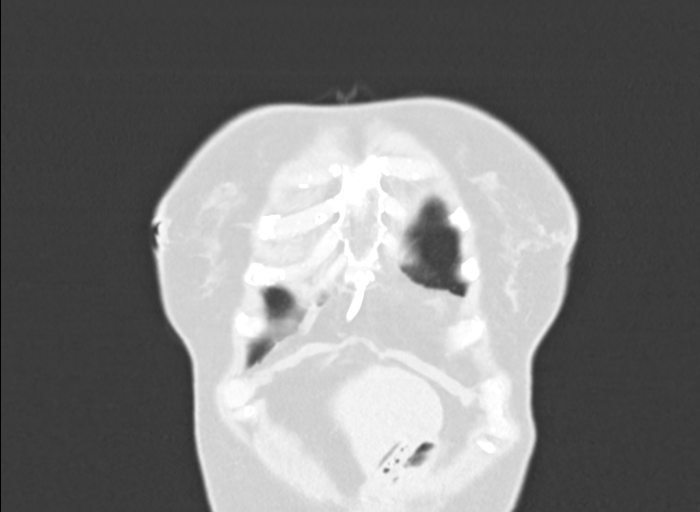
[im 53/133  lung]
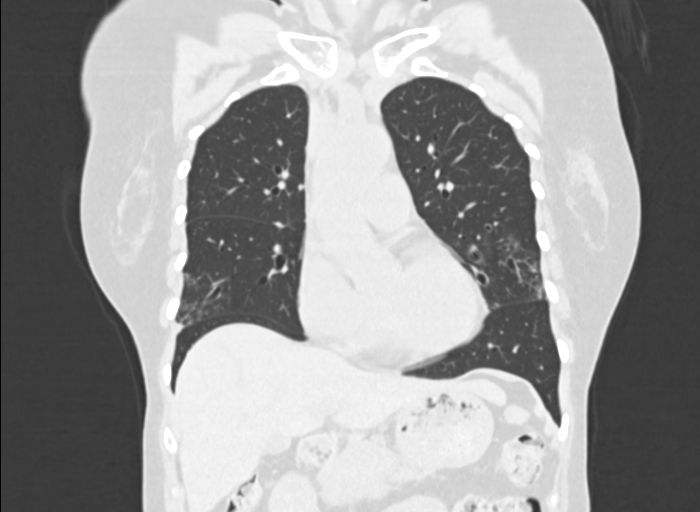
[im 80/133  lung]
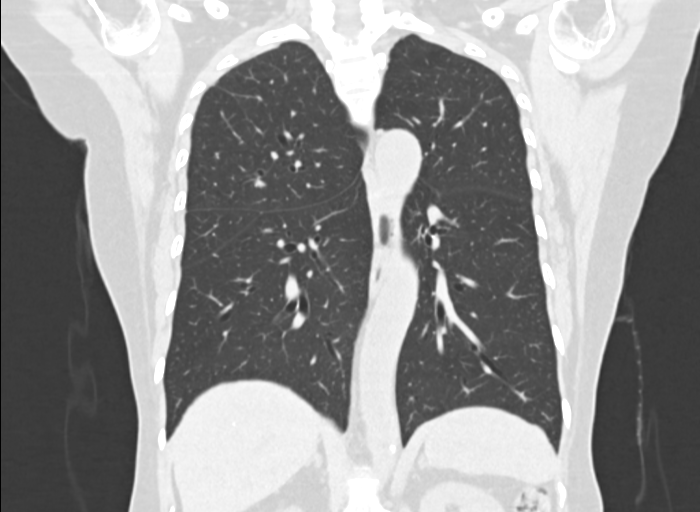

[11 of 36 positions shown; findings below may reference images not displayed]

FINDINGS: Cardiovascular: Normal heart size. No significant pericardial
effusion/thickening. Left anterior descending coronary
atherosclerosis. Atherosclerotic nonaneurysmal thoracic aorta.
Normal caliber pulmonary arteries.

Mediastinum/Nodes: No discrete thyroid nodules. Unremarkable
esophagus. No pathologically enlarged axillary, mediastinal or hilar
lymph nodes, noting limited sensitivity for the detection of hilar
adenopathy on this noncontrast study.

Lungs/Pleura: No pneumothorax. No pleural effusion. There are patchy
regions of moderate cylindrical and varicoid bronchiectasis in both
lungs, most prominent in the right middle lobe and lingula, with
associated moderate patchy tree-in-bud opacities and scattered
bandlike subpleural regions of consolidation with associated volume
loss and distortion. No lung masses or significant pulmonary
nodules.

Upper abdomen: Granulomatous right liver calcification.

Musculoskeletal:  No aggressive appearing focal osseous lesions.
IMPRESSION: 1. Spectrum of findings compatible with chronic infectious
bronchiolitis due to atypical mycobacterial infection (ELIRZA)
including moderate bronchiectasis and tree-in-bud opacities,
predominantly in the mid lungs.
2. One vessel coronary atherosclerosis.
3.  Aortic Atherosclerosis ([id number]-VEO.O).

## 2022-06-06 DIAGNOSIS — I10 Essential (primary) hypertension: Secondary | ICD-10-CM | POA: Diagnosis not present

## 2022-06-06 DIAGNOSIS — M810 Age-related osteoporosis without current pathological fracture: Secondary | ICD-10-CM | POA: Diagnosis not present

## 2022-06-13 DIAGNOSIS — Z23 Encounter for immunization: Secondary | ICD-10-CM | POA: Diagnosis not present

## 2022-06-13 DIAGNOSIS — I10 Essential (primary) hypertension: Secondary | ICD-10-CM | POA: Diagnosis not present

## 2022-06-20 DIAGNOSIS — I10 Essential (primary) hypertension: Secondary | ICD-10-CM | POA: Diagnosis not present

## 2022-08-21 DIAGNOSIS — N1831 Chronic kidney disease, stage 3a: Secondary | ICD-10-CM | POA: Diagnosis not present

## 2022-08-21 DIAGNOSIS — E785 Hyperlipidemia, unspecified: Secondary | ICD-10-CM | POA: Diagnosis not present

## 2022-08-21 DIAGNOSIS — I10 Essential (primary) hypertension: Secondary | ICD-10-CM | POA: Diagnosis not present

## 2022-08-21 DIAGNOSIS — E1122 Type 2 diabetes mellitus with diabetic chronic kidney disease: Secondary | ICD-10-CM | POA: Diagnosis not present

## 2022-08-21 DIAGNOSIS — M810 Age-related osteoporosis without current pathological fracture: Secondary | ICD-10-CM | POA: Diagnosis not present

## 2022-08-28 DIAGNOSIS — N1831 Chronic kidney disease, stage 3a: Secondary | ICD-10-CM | POA: Diagnosis not present

## 2022-08-28 DIAGNOSIS — E785 Hyperlipidemia, unspecified: Secondary | ICD-10-CM | POA: Diagnosis not present

## 2022-08-28 DIAGNOSIS — I10 Essential (primary) hypertension: Secondary | ICD-10-CM | POA: Diagnosis not present

## 2022-08-28 DIAGNOSIS — Z0000 Encounter for general adult medical examination without abnormal findings: Secondary | ICD-10-CM | POA: Diagnosis not present

## 2022-08-28 DIAGNOSIS — E1122 Type 2 diabetes mellitus with diabetic chronic kidney disease: Secondary | ICD-10-CM | POA: Diagnosis not present

## 2022-11-21 DIAGNOSIS — H401222 Low-tension glaucoma, left eye, moderate stage: Secondary | ICD-10-CM | POA: Diagnosis not present

## 2022-12-10 DIAGNOSIS — M810 Age-related osteoporosis without current pathological fracture: Secondary | ICD-10-CM | POA: Diagnosis not present

## 2022-12-24 DIAGNOSIS — H401211 Low-tension glaucoma, right eye, mild stage: Secondary | ICD-10-CM | POA: Diagnosis not present

## 2022-12-24 DIAGNOSIS — H25813 Combined forms of age-related cataract, bilateral: Secondary | ICD-10-CM | POA: Diagnosis not present

## 2022-12-24 DIAGNOSIS — H40033 Anatomical narrow angle, bilateral: Secondary | ICD-10-CM | POA: Diagnosis not present

## 2022-12-24 DIAGNOSIS — H401222 Low-tension glaucoma, left eye, moderate stage: Secondary | ICD-10-CM | POA: Diagnosis not present

## 2022-12-25 DIAGNOSIS — R0683 Snoring: Secondary | ICD-10-CM | POA: Diagnosis not present

## 2022-12-25 DIAGNOSIS — R0602 Shortness of breath: Secondary | ICD-10-CM | POA: Diagnosis not present

## 2022-12-25 DIAGNOSIS — R5383 Other fatigue: Secondary | ICD-10-CM | POA: Diagnosis not present

## 2022-12-25 DIAGNOSIS — R030 Elevated blood-pressure reading, without diagnosis of hypertension: Secondary | ICD-10-CM | POA: Diagnosis not present

## 2023-01-21 ENCOUNTER — Ambulatory Visit: Payer: 59 | Admitting: Cardiology

## 2023-01-21 ENCOUNTER — Encounter: Payer: Self-pay | Admitting: Cardiology

## 2023-01-21 VITALS — BP 131/75 | HR 64 | Ht 61.0 in | Wt 111.2 lb

## 2023-01-21 DIAGNOSIS — I351 Nonrheumatic aortic (valve) insufficiency: Secondary | ICD-10-CM

## 2023-01-21 DIAGNOSIS — R072 Precordial pain: Secondary | ICD-10-CM | POA: Diagnosis not present

## 2023-01-21 DIAGNOSIS — E782 Mixed hyperlipidemia: Secondary | ICD-10-CM | POA: Diagnosis not present

## 2023-01-21 DIAGNOSIS — I10 Essential (primary) hypertension: Secondary | ICD-10-CM | POA: Diagnosis not present

## 2023-01-21 DIAGNOSIS — H401211 Low-tension glaucoma, right eye, mild stage: Secondary | ICD-10-CM | POA: Diagnosis not present

## 2023-01-21 DIAGNOSIS — R0609 Other forms of dyspnea: Secondary | ICD-10-CM | POA: Diagnosis not present

## 2023-01-21 DIAGNOSIS — I77819 Aortic ectasia, unspecified site: Secondary | ICD-10-CM

## 2023-01-21 DIAGNOSIS — H401222 Low-tension glaucoma, left eye, moderate stage: Secondary | ICD-10-CM | POA: Diagnosis not present

## 2023-01-21 DIAGNOSIS — E119 Type 2 diabetes mellitus without complications: Secondary | ICD-10-CM

## 2023-01-21 MED ORDER — AMLODIPINE BESYLATE 2.5 MG PO TABS
2.5000 mg | ORAL_TABLET | Freq: Every day | ORAL | 3 refills | Status: DC
Start: 2023-01-21 — End: 2023-03-28

## 2023-01-21 NOTE — Progress Notes (Signed)
ID:  Danielle Richard, DOB Nov 22, 1950, MRN [id number]  PCP:  Linus Galas, NP  Cardiologist:  Tessa Lerner, DO, [facility] (established care 01/21/23) Former Cardiology Providers: Dr. Eldridge Dace, Jacolyn Reedy PA-C  REASON FOR CONSULT: Dyspnea on exertion and labile blood pressures  REQUESTING PHYSICIAN:  Linus Galas, NP [address] [place],  [place] [address]  Chief Complaint  Patient presents with   Dyspnea on exertion   New Patient (Initial Visit)    HPI  Danielle Richard is a 72 y.o. [name] female who presents to the clinic for evaluation of chest pain, dyspnea, blood pressure at the request of Linus Galas, NP. Her past medical history and cardiovascular risk factors include: Hypertension, hyperlipidemia, non-insulin-dependent diabetes mellitus type 2, GERD, peptic ulcer disease, glaucoma, Aortic atherosclerosis, dilated ascending aorta, postmenopausal female, advanced age.  Patient is accompanied by her daughter who provides majority of present illness and also serves as a Nurse, learning disability between [place] and [name]  Precordial pain: Ongoing for the last 1 year. Couple times a month. Duration: 5 minutes. Not brought on by effort related activities, does not resolve with rest. Usually self-limiting. Nonradiating. Intensity 5 out of 10.   Better with massaging. Over the last 1 year no significant change in intensity, frequency, and/or duration.  Dyspnea on exertion chronic and stable.  Home blood pressure log reviewed.  AM BP ranges between 135-151 mmHg and p.m. blood pressures are 130-140 mmHg.  FUNCTIONAL STATUS: Gardening  No structured exercise program or daily routine.   CARDIAC DATABASE: EKG: January 21, 2023: Sinus bradycardia, 58 bpm, without underlying ischemia or injury pattern.  Echocardiogram: 04/2019: LVEF 55 to 60%. Moderate LVH. Mild dilatation of the ascending aorta 36 mm. Right ventricular size and function normal. Diastolic function-impaired  relaxation. Moderate AR. Estimated RAP 3 mmHg No pulmonary hypertension, RVSP 22 mmHg  Stress Testing: 10/2017 Nuclear stress EF: 68%. The left ventricular ejection fraction is hyperdynamic (>65%). The study is normal. This is a low risk study. There is no evidence of ischemia. Normal left ventricular systolic function.`  Heart Catheterization: None  Carotid duplex: 04/2019 Right Carotid: There is no evidence of stenosis in the right ICA.   Left Carotid: There is no evidence of stenosis in the left ICA.   Vertebrals:  Bilateral vertebral arteries demonstrate antegrade flow.  Subclavians: Normal flow hemodynamics were seen in bilateral subclavian arteries.    RADIOLOGY: CT chest without contrast May 2021: 1. 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 2. One vessel coronary atherosclerosis. 3.  Aortic Atherosclerosis (ICD10-I70.0).  ~~Proximal ascending aorta 39 mm.   ALLERGIES: Allergies  Allergen Reactions   Atorvastatin Calcium     Other Reaction(s): myalgia with generic   Hydrochlorothiazide     Other Reaction(s): dizziness   Levaquin [Levofloxacin] Other (See Comments)    Tendon pain   Lisinopril Cough    Other Reaction(s): cough   Methocarbamol     Other Reaction(s): Unknown   Aspirin Rash    MEDICATION LIST PRIOR TO VISIT: Current Meds  Medication Sig   amLODipine (NORVASC) 2.5 MG tablet Take 1 tablet (2.5 mg total) by mouth daily at 10 pm.   atorvastatin (LIPITOR) 40 MG tablet Take 1 tablet (40 mg total) by mouth daily.   Blood Glucose Monitoring Suppl (TRUE METRIX METER) DEVI 1 each by Does not apply route 3 (three) times daily before meals.   chlorpheniramine (  CHLOR-TRIMETON) 4 MG tablet Take 4 mg by mouth every 4 (four) hours as needed for allergies.   citalopram (CELEXA) 20 MG tablet  Take 1 tablet (20 mg total) by mouth every morning.   dicyclomine (BENTYL) 10 MG capsule Take 10 mg by mouth 4 (four) times daily -  before meals and at bedtime.   glucose blood (TRUE METRIX BLOOD GLUCOSE TEST) test strip Use 3 times daily before meals   latanoprost (XALATAN) 0.005 % ophthalmic solution PLACE ONE DROP INTO BOTH EYES AT BEDTIME   losartan (COZAAR) 50 MG tablet Take 50 mg by mouth daily.   metFORMIN (GLUCOPHAGE) 500 MG tablet TAKE 1 TABLET (500 MG TOTAL) BY MOUTH 2 TIMES DAILY WITH A MEAL.   omeprazole (PRILOSEC) 40 MG capsule Take 40 mg by mouth in the morning and at bedtime.   PROLIA 60 MG/ML SOSY injection INJECT 60 MG Q 6 MONTHS   sucralfate (CARAFATE) 1 g tablet Take 1 tablet by mouth. On an empty stomach three times daily before meals   traZODone (DESYREL) 100 MG tablet Take 1 tablet (100 mg total) by mouth at bedtime.   triamterene (DYRENIUM) 50 MG capsule Take 50 mg by mouth daily.   TRUEPLUS LANCETS 28G MISC 1 each by Does not apply route 3 (three) times daily before meals.     PAST MEDICAL HISTORY: Past Medical History:  Diagnosis Date   Aortic insufficiency    pt unaware of this   Depression    Diabetes mellitus, type II (HCC)    Dizziness    chronic for 10 years and mild   GERD (gastroesophageal reflux disease)    H/O mammogram 2016   normal   History of nuclear stress test 10/16/2017   negative for ischemia   HLD (hyperlipidemia)    Hx of colonoscopy 2015   repeat 5 years   Hypertension    Insomnia    Personal history of colonic polyps    PUD (peptic ulcer disease)     PAST SURGICAL HISTORY: Past Surgical History:  Procedure Laterality Date   COLONOSCOPY WITH PROPOFOL N/A 09/29/2013   Procedure: COLONOSCOPY WITH PROPOFOL;  Surgeon: Charolett Bumpers, MD;  Location: [facility];  Service: Endoscopy;  Laterality: N/A;   ESOPHAGOGASTRODUODENOSCOPY (EGD) WITH PROPOFOL N/A 09/29/2013   Procedure: ESOPHAGOGASTRODUODENOSCOPY (EGD) WITH PROPOFOL;  Surgeon:  Charolett Bumpers, MD;  Location: [facility];  Service: Endoscopy;  Laterality: N/A;   NO PAST SURGERIES      FAMILY HISTORY: The patient family history includes Breast cancer (age of onset: 64) in her sister; CAD in her mother; Cancer in her father; Diabetes type II in her brother and sister; Heart disease in her father; High Cholesterol in her brother and sister; Hypertension in her sister.  SOCIAL HISTORY:  The patient  reports that she has never smoked. She has never used smokeless tobacco. She reports that she does not currently use alcohol after a past usage of about 2.0 standard drinks of alcohol per week. She reports that she does not use drugs.  REVIEW OF SYSTEMS: Review of Systems  Cardiovascular:  Positive for chest pain and dyspnea on exertion. Negative for claudication, irregular heartbeat, leg swelling, near-syncope, orthopnea, palpitations, paroxysmal nocturnal dyspnea and syncope.  Respiratory:  Negative for shortness of breath.   Hematologic/Lymphatic: Negative for bleeding problem.  Musculoskeletal:  Negative for muscle cramps and myalgias.  Neurological:  Negative for dizziness and light-headedness.    PHYSICAL EXAM:    01/21/2023    2:49  PM 01/10/2021   10:09 AM 12/13/2020   10:14 AM  Vitals with BMI  Height 5\' 1"  5\' 1"  5\' 1"   Weight 111 lbs 3 oz 108 lbs 107 lbs  BMI 21.02 20.42 20.23  Systolic 131 112 161  Diastolic 75 60 70  Pulse 64 83 82    Physical Exam  Constitutional: No distress.  Age appropriate, hemodynamically stable.   Neck: No JVD present.  Cardiovascular: Normal rate, regular rhythm, S1 normal, S2 normal, intact distal pulses and normal pulses. Exam reveals no gallop, no S3 and no S4.  No murmur heard. Pulmonary/Chest: Effort normal and breath sounds normal. No stridor. She has no wheezes. She has no rales.  Abdominal: Soft. Bowel sounds are normal. She exhibits no distension. There is no abdominal tenderness.  Musculoskeletal:        General:  No edema.     Cervical back: Neck supple.  Neurological: She is alert and oriented to person, place, and time. She has intact cranial nerves (2-12).  Skin: Skin is warm and moist.   LABORATORY DATA: External Labs: Collected: August 21, 2022. AST 18, ALT 24, alkaline phosphatase 41. BUN 15, creatinine 0.98. Sodium 143, potassium 4.2, chloride 105, bicarb 30. Hemoglobin 11.9, hematocrit 35.2%. Hemoglobin A1c 6.6. TSH 1.35. Total cholesterol 140, triglycerides 111, HDL 57, LDL calculated 61, non-HDL 83  IMPRESSION:    ICD-10-CM   1. Dyspnea on exertion  R06.09 PCV ECHOCARDIOGRAM COMPLETE    PCV MYOCARDIAL PERFUSION WO LEXISCAN    2. Precordial pain  R07.2 PCV ECHOCARDIOGRAM COMPLETE    PCV MYOCARDIAL PERFUSION WO LEXISCAN    3. Aortic dilatation (HCC)  I77.819 CT CHEST WO CONTRAST    4. Essential hypertension  I10 EKG 12-Lead    amLODipine (NORVASC) 2.5 MG tablet    5. Nonrheumatic aortic valve insufficiency  I35.1     6. Mixed hyperlipidemia  E78.2     7. Non-insulin dependent type 2 diabetes mellitus (HCC)  E11.9        RECOMMENDATIONS: Danielle Richard is a 72 y.o. [name] female whose past medical history and cardiac risk factors include: Hypertension, hyperlipidemia, non-insulin-dependent diabetes mellitus type 2, GERD, peptic ulcer disease, glaucoma, Aortic atherosclerosis, dilated ascending aorta, postmenopausal female, advanced age.  Dyspnea on exertion Precordial pain Precordial discomfort predominantly noncardiac. However has multiple cardiovascular risk factors including hypertension, hyperlipidemia, diabetes. Recommend exercise nuclear stress test to evaluate for reversible ischemia and functional capacity. EKG nonischemic  Aortic dilatation (HCC) CT of the chest without contrast from May 2021 notes ascending aorta dilatation to Akisha 39 mm. Reemphasized the importance of blood pressure management. No family history of aortic syndromes. CT chest without  contrast to reevaluate aortic dimensions  Essential hypertension Office blood pressures are well-controlled. Family still concerned with regards to labile blood pressures. Has not done well with hydrochlorothiazide in the past due to dizziness. Currently takes triamterene in the morning and losartan twice daily. I have asked her to take triamterene and losartan in the morning and low-dose amlodipine and losartan at night. Will continue to check blood pressures daily. Eventually would like to transition her twice daily losartan to daily. In the interim, I have asked her to discuss with PCP the transition from triamterene to SGLT2 inhibitors given the history of diabetes  Nonrheumatic aortic valve insufficiency Prior echo noted severity of AI to [place] moderate.   Prior echo independently reviewed, LVEF is preserved and the severity of AI is mild. Will repeat echocardiogram to reevaluate LVEF and  valvular heart disease  Mixed hyperlipidemia Currently on atorvastatin.   She denies myalgia or other side effects. Most recent lipids dated January 2024, independently reviewed as noted above. Currently managed by primary care provider.  Non-insulin dependent type 2 diabetes mellitus (HCC) Reemphasized importance of glycemic control. Most recent hemoglobin A1c well-controlled. Currently on ARB, statin therapy. Considering transitioning from triamterene to SGLT2 inhibitors.  Data Reviewed: I have independently reviewed external notes provided by the referring provider as part of this office visit.   I have independently reviewed results of EKG, labs as part of medical decision making. I have ordered the following tests:  Orders Placed This Encounter  Procedures   CT CHEST WO CONTRAST    Wt: 111 No spinal stimulator / No body injector/ No glucose or heart monitor No special Needs Ins: [employer] MCD s/w Felicia @ [place] [facility] [phone]  epic order/LM Pt aware of the $75 no show  fee  pt will have some one with her to Interpreter    Standing Status:   Future    Standing Expiration Date:   01/21/2024    Order Specific Question:   Preferred imaging location?    Answer:   [id number]   PCV MYOCARDIAL PERFUSION WO LEXISCAN    Standing Status:   Future    Standing Expiration Date:   01/21/2024   EKG 12-Lead   PCV ECHOCARDIOGRAM COMPLETE    Standing Status:   Future    Standing Expiration Date:   01/21/2024   I have made medications changes at today's encounter as noted above. History of present illness was obtained by both patient and daughter at today's office visit and she helps translate between [name] and [place].   FINAL MEDICATION LIST END OF ENCOUNTER: Meds ordered this encounter  Medications   amLODipine (NORVASC) 2.5 MG tablet    Sig: Take 1 tablet (2.5 mg total) by mouth daily at 10 pm.    Dispense:  180 tablet    Refill:  3    Medications Discontinued During This Encounter  Medication Reason   dicyclomine (BENTYL) 20 MG tablet    losartan (COZAAR) 25 MG tablet    benzonatate (TESSALON) 200 MG capsule    gabapentin (NEURONTIN) 100 MG capsule    predniSONE (DELTASONE) 10 MG tablet    VITAMIN D PO      Current Outpatient Medications:    amLODipine (NORVASC) 2.5 MG tablet, Take 1 tablet (2.5 mg total) by mouth daily at 10 pm., Disp: 180 tablet, Rfl: 3   atorvastatin (LIPITOR) 40 MG tablet, Take 1 tablet (40 mg total) by mouth daily., Disp: 90 tablet, Rfl: 3   Blood Glucose Monitoring Suppl (TRUE METRIX METER) DEVI, 1 each by Does not apply route 3 (three) times daily before meals., Disp: 1 Device, Rfl: 0   chlorpheniramine (CHLOR-TRIMETON) 4 MG tablet, Take 4 mg by mouth every 4 (four) hours as needed for allergies., Disp: , Rfl:    citalopram (CELEXA) 20 MG tablet, Take 1 tablet (20 mg total) by mouth every morning., Disp: 60 tablet, Rfl: 3   dicyclomine (BENTYL) 10 MG capsule, Take 10 mg by mouth 4 (four) times daily -  before meals and  at bedtime., Disp: , Rfl:    glucose blood (TRUE METRIX BLOOD GLUCOSE TEST) test strip, Use 3 times daily before meals, Disp: 60 each, Rfl: 12   latanoprost (XALATAN) 0.005 % ophthalmic solution, PLACE ONE DROP INTO BOTH EYES AT BEDTIME, Disp: , Rfl: 4  losartan (COZAAR) 50 MG tablet, Take 50 mg by mouth daily., Disp: , Rfl:    metFORMIN (GLUCOPHAGE) 500 MG tablet, TAKE 1 TABLET (500 MG TOTAL) BY MOUTH 2 TIMES DAILY WITH A MEAL., Disp: 60 tablet, Rfl: 5   omeprazole (PRILOSEC) 40 MG capsule, Take 40 mg by mouth in the morning and at bedtime., Disp: , Rfl:    PROLIA 60 MG/ML SOSY injection, INJECT 60 MG Q 6 MONTHS, Disp: , Rfl: 0   sucralfate (CARAFATE) 1 g tablet, Take 1 tablet by mouth. On an empty stomach three times daily before meals, Disp: , Rfl: 4   traZODone (DESYREL) 100 MG tablet, Take 1 tablet (100 mg total) by mouth at bedtime., Disp: 30 tablet, Rfl: 5   triamterene (DYRENIUM) 50 MG capsule, Take 50 mg by mouth daily., Disp: , Rfl:    TRUEPLUS LANCETS 28G MISC, 1 each by Does not apply route 3 (three) times daily before meals., Disp: 100 each, Rfl: 12  Orders Placed This Encounter  Procedures   CT CHEST WO CONTRAST   PCV MYOCARDIAL PERFUSION WO LEXISCAN   EKG 12-Lead   PCV ECHOCARDIOGRAM COMPLETE    There are no Patient Instructions on file for this visit.   --Continue cardiac medications as reconciled in final medication list. --Return in about 6 weeks (around 03/04/2023) for Reevaluation of, Chest pain, Dyspnea, Review test results. or sooner if needed. --Continue follow-up with your primary care physician regarding the management of your other chronic comorbid conditions.  Patient's questions and concerns were addressed to her satisfaction. She voices understanding of the instructions provided during this encounter.   This note was created using a voice recognition software as a result there may [place] grammatical errors inadvertently enclosed that do not reflect the nature of  this encounter. Every attempt is made to correct such errors.  Tessa Lerner, [place], [facility]  Pager:  [phone] Office: [phone]

## 2023-01-30 DIAGNOSIS — I10 Essential (primary) hypertension: Secondary | ICD-10-CM | POA: Diagnosis not present

## 2023-01-30 DIAGNOSIS — H9191 Unspecified hearing loss, right ear: Secondary | ICD-10-CM | POA: Diagnosis not present

## 2023-01-30 DIAGNOSIS — E119 Type 2 diabetes mellitus without complications: Secondary | ICD-10-CM | POA: Diagnosis not present

## 2023-01-30 DIAGNOSIS — R42 Dizziness and giddiness: Secondary | ICD-10-CM | POA: Diagnosis not present

## 2023-02-04 ENCOUNTER — Ambulatory Visit: Payer: 59

## 2023-02-04 DIAGNOSIS — R0609 Other forms of dyspnea: Secondary | ICD-10-CM

## 2023-02-04 DIAGNOSIS — R072 Precordial pain: Secondary | ICD-10-CM | POA: Diagnosis not present

## 2023-02-11 NOTE — Progress Notes (Signed)
Spoke to patient daughter about stress test results. She mention her father Danielle Richard DOB: [birth year]1946 creatinine levels was high at 2.01 and has a doctor appointment with his primary Wednesday to have blood work done

## 2023-02-15 ENCOUNTER — Other Ambulatory Visit: Payer: Self-pay | Admitting: Otolaryngology

## 2023-02-15 DIAGNOSIS — H9311 Tinnitus, right ear: Secondary | ICD-10-CM | POA: Diagnosis not present

## 2023-02-15 DIAGNOSIS — R42 Dizziness and giddiness: Secondary | ICD-10-CM | POA: Diagnosis not present

## 2023-02-15 DIAGNOSIS — H9041 Sensorineural hearing loss, unilateral, right ear, with unrestricted hearing on the contralateral side: Secondary | ICD-10-CM | POA: Diagnosis not present

## 2023-02-15 DIAGNOSIS — H9120 Sudden idiopathic hearing loss, unspecified ear: Secondary | ICD-10-CM

## 2023-02-19 ENCOUNTER — Ambulatory Visit
Admission: RE | Admit: 2023-02-19 | Discharge: 2023-02-19 | Disposition: A | Discharge status: Home / Self Care | Payer: 59 | Source: Ambulatory Visit | Attending: Cardiology | Admitting: Cardiology

## 2023-02-19 ENCOUNTER — Other Ambulatory Visit: Payer: 59

## 2023-02-19 DIAGNOSIS — I700 Atherosclerosis of aorta: Secondary | ICD-10-CM | POA: Diagnosis not present

## 2023-02-19 DIAGNOSIS — I10 Essential (primary) hypertension: Secondary | ICD-10-CM | POA: Diagnosis not present

## 2023-02-19 DIAGNOSIS — I2510 Atherosclerotic heart disease of native coronary artery without angina pectoris: Secondary | ICD-10-CM | POA: Diagnosis not present

## 2023-02-19 DIAGNOSIS — J9809 Other diseases of bronchus, not elsewhere classified: Secondary | ICD-10-CM | POA: Diagnosis not present

## 2023-02-19 DIAGNOSIS — E1122 Type 2 diabetes mellitus with diabetic chronic kidney disease: Secondary | ICD-10-CM | POA: Diagnosis not present

## 2023-02-19 DIAGNOSIS — I77810 Thoracic aortic ectasia: Secondary | ICD-10-CM | POA: Diagnosis not present

## 2023-02-19 DIAGNOSIS — E785 Hyperlipidemia, unspecified: Secondary | ICD-10-CM | POA: Diagnosis not present

## 2023-02-19 DIAGNOSIS — N1831 Chronic kidney disease, stage 3a: Secondary | ICD-10-CM | POA: Diagnosis not present

## 2023-02-19 DIAGNOSIS — I77819 Aortic ectasia, unspecified site: Secondary | ICD-10-CM

## 2023-02-20 ENCOUNTER — Other Ambulatory Visit: Payer: 59

## 2023-02-26 DIAGNOSIS — I10 Essential (primary) hypertension: Secondary | ICD-10-CM | POA: Diagnosis not present

## 2023-02-26 DIAGNOSIS — E119 Type 2 diabetes mellitus without complications: Secondary | ICD-10-CM | POA: Diagnosis not present

## 2023-02-26 DIAGNOSIS — N1831 Chronic kidney disease, stage 3a: Secondary | ICD-10-CM | POA: Diagnosis not present

## 2023-02-26 DIAGNOSIS — H9191 Unspecified hearing loss, right ear: Secondary | ICD-10-CM | POA: Diagnosis not present

## 2023-03-05 ENCOUNTER — Ambulatory Visit: Payer: 59 | Admitting: Cardiology

## 2023-03-08 ENCOUNTER — Ambulatory Visit: Payer: 59 | Admitting: Cardiology

## 2023-03-12 ENCOUNTER — Ambulatory Visit
Admission: RE | Admit: 2023-03-12 | Discharge: 2023-03-12 | Disposition: A | Discharge status: Home / Self Care | Payer: 59 | Source: Ambulatory Visit | Attending: Otolaryngology | Admitting: Otolaryngology

## 2023-03-12 DIAGNOSIS — H9120 Sudden idiopathic hearing loss, unspecified ear: Secondary | ICD-10-CM

## 2023-03-12 DIAGNOSIS — H9311 Tinnitus, right ear: Secondary | ICD-10-CM | POA: Diagnosis not present

## 2023-03-12 DIAGNOSIS — H9121 Sudden idiopathic hearing loss, right ear: Secondary | ICD-10-CM | POA: Diagnosis not present

## 2023-03-12 MED ORDER — GADOPICLENOL 0.5 MMOL/ML IV SOLN
6.0000 mL | Freq: Once | INTRAVENOUS | Status: AC | PRN
  Administered 2023-03-12: 6 mL via INTRAVENOUS

## 2023-03-12 NOTE — Progress Notes (Signed)
LMAM to follow up with her PCP

## 2023-03-13 ENCOUNTER — Ambulatory Visit: Payer: 59

## 2023-03-13 DIAGNOSIS — R0609 Other forms of dyspnea: Secondary | ICD-10-CM

## 2023-03-13 DIAGNOSIS — R072 Precordial pain: Secondary | ICD-10-CM | POA: Diagnosis not present

## 2023-03-15 DIAGNOSIS — R42 Dizziness and giddiness: Secondary | ICD-10-CM | POA: Diagnosis not present

## 2023-03-15 DIAGNOSIS — H9311 Tinnitus, right ear: Secondary | ICD-10-CM | POA: Diagnosis not present

## 2023-03-15 DIAGNOSIS — H9120 Sudden idiopathic hearing loss, unspecified ear: Secondary | ICD-10-CM | POA: Diagnosis not present

## 2023-03-15 DIAGNOSIS — H90A21 Sensorineural hearing loss, unilateral, right ear, with restricted hearing on the contralateral side: Secondary | ICD-10-CM | POA: Diagnosis not present

## 2023-03-15 DIAGNOSIS — H9121 Sudden idiopathic hearing loss, right ear: Secondary | ICD-10-CM | POA: Diagnosis not present

## 2023-03-20 DIAGNOSIS — H9120 Sudden idiopathic hearing loss, unspecified ear: Secondary | ICD-10-CM | POA: Diagnosis not present

## 2023-03-20 DIAGNOSIS — H9121 Sudden idiopathic hearing loss, right ear: Secondary | ICD-10-CM | POA: Diagnosis not present

## 2023-03-20 DIAGNOSIS — H9311 Tinnitus, right ear: Secondary | ICD-10-CM | POA: Diagnosis not present

## 2023-03-26 ENCOUNTER — Ambulatory Visit: Payer: 59 | Admitting: Cardiology

## 2023-03-28 ENCOUNTER — Ambulatory Visit: Payer: 59 | Admitting: Cardiology

## 2023-03-28 ENCOUNTER — Encounter: Payer: Self-pay | Admitting: Cardiology

## 2023-03-28 VITALS — BP 152/75 | HR 65 | Resp 16 | Ht 61.0 in | Wt 106.0 lb

## 2023-03-28 DIAGNOSIS — I10 Essential (primary) hypertension: Secondary | ICD-10-CM | POA: Diagnosis not present

## 2023-03-28 DIAGNOSIS — I351 Nonrheumatic aortic (valve) insufficiency: Secondary | ICD-10-CM

## 2023-03-28 DIAGNOSIS — H9121 Sudden idiopathic hearing loss, right ear: Secondary | ICD-10-CM | POA: Diagnosis not present

## 2023-03-28 DIAGNOSIS — H9311 Tinnitus, right ear: Secondary | ICD-10-CM | POA: Diagnosis not present

## 2023-03-28 DIAGNOSIS — I77819 Aortic ectasia, unspecified site: Secondary | ICD-10-CM

## 2023-03-28 DIAGNOSIS — E119 Type 2 diabetes mellitus without complications: Secondary | ICD-10-CM

## 2023-03-28 DIAGNOSIS — E785 Hyperlipidemia, unspecified: Secondary | ICD-10-CM | POA: Diagnosis not present

## 2023-03-28 DIAGNOSIS — R0609 Other forms of dyspnea: Secondary | ICD-10-CM | POA: Diagnosis not present

## 2023-03-28 MED ORDER — AMLODIPINE BESYLATE 5 MG PO TABS
5.0000 mg | ORAL_TABLET | Freq: Every day | ORAL | 1 refills | Status: AC
Start: 2023-03-28 — End: 2023-11-20

## 2023-03-28 MED ORDER — ATORVASTATIN CALCIUM 40 MG PO TABS
40.0000 mg | ORAL_TABLET | Freq: Every day | ORAL | 1 refills | Status: AC
Start: 2023-03-28 — End: 2023-11-20

## 2023-03-28 MED ORDER — LOSARTAN POTASSIUM 100 MG PO TABS
100.0000 mg | ORAL_TABLET | Freq: Every morning | ORAL | 0 refills | Status: AC
Start: 2023-03-28 — End: 2023-09-24

## 2023-03-28 MED ORDER — FARXIGA 5 MG PO TABS
5.0000 mg | ORAL_TABLET | Freq: Every morning | ORAL | 0 refills | Status: AC
Start: 2023-03-28 — End: 2023-06-26

## 2023-03-28 NOTE — Progress Notes (Signed)
ID:  Danielle Richard, DOB 14-Apr-1951, MRN [id number]  PCP:  Linus Galas, NP  Cardiologist:  Tessa Lerner, DO, [facility] (established care 03/28/23) Former Cardiology Providers: Dr. Eldridge Dace, Jacolyn Reedy PA-C  Date: 03/28/23 Last Office Visit: 01/21/2023  Chief Complaint  Patient presents with   Follow-up    Reevaluation of chest pain and dyspnea. Review test results    HPI  Danielle Richard is a 72 y.o. [name] female whose past medical history and cardiovascular risk factors include: Hypertension, hyperlipidemia, non-insulin-dependent diabetes mellitus type 2, GERD, peptic ulcer disease, glaucoma, Aortic atherosclerosis, dilated ascending aorta, postmenopausal female, advanced age.  Patient is accompanied by her daughter who provides majority of present illness and also serves as a Nurse, learning disability between [place] and [name]  Patient presents today for follow-up to reevaluate chest pain and shortness of breath and discuss test results.  Since last office visit patient states that both chest pain and shortness of breath have improved significantly.  She did undergo echo and stress test results reviewed with the patient and her daughter in great detail and noted below for further reference.  Since last office visit patient has discontinued triamterene and has been started on Farxiga by PCP.  Her home blood pressures used to range between 130-151 mmHg.  On current medical regimen her home blood pressures are very well-controlled.  Her blood pressure log is scanned under the media section.  In the morning she takes losartan 100 mg p.o. daily and Farxiga 5 mg p.o. daily and in the evening she takes amlodipine 2.5 mg p.o. daily  FUNCTIONAL STATUS: Gardening  No structured exercise program or daily routine.   CARDIAC DATABASE: EKG: January 21, 2023: Sinus bradycardia, 58 bpm, without underlying ischemia or injury pattern.  Echocardiogram: 04/2019: LVEF 55 to 60%. Moderate LVH. Mild dilatation  of the ascending aorta 36 mm. Right ventricular size and function normal. Diastolic function-impaired relaxation. Moderate AR. Estimated RAP 3 mmHg No pulmonary hypertension, RVSP 22 mmHg  03/13/2023: Normal LV systolic function with visual EF 60-65%. Left ventricle cavity is normal in size. Normal left ventricular wall thickness. Normal global wall motion. Normal diastolic filling pattern, normal LAP. Native trileaflet aortic valve. No evidence of aortic stenosis. Mild to moderate aortic regurgitation, eccentric jet, along anterior mitral valve leaflet. Mild tricuspid regurgitation. No evidence of pulmonary hypertension. The aortic root is normal. Proximal ascending aorta dilated, 40 mm. Compared to 04/2019: Ascending aorta reported to [place] 36mm now 40mm, moderate AR is now mild/moderate, otherwise no significant change.    Stress Testing: Lexiscan Tetrofosmin stress test 02/04/2023: 1 Day Rest/Stress protocol.  Stress EKG is non-diagnostic, as this is pharmacological stress test using Lexiscan. Normal myocardial perfusion without convincing evidence of reversible ischemia or prior infarct. Calculated LVEF 69%, left ventricular size normal, no obvious regional wall motion abnormalities. Prior study 10/2017 reported as normal myocardial perfusion, gated LVEF 68%. Low risk study.    Heart Catheterization: None  Carotid duplex: 04/2019 Right Carotid: There is no evidence of stenosis in the right ICA.   Left Carotid: There is no evidence of stenosis in the left ICA.   Vertebrals:  Bilateral vertebral arteries demonstrate antegrade flow.  Subclavians: Normal flow hemodynamics were seen in bilateral subclavian arteries.    RADIOLOGY: CT chest without contrast May 2021: 1. 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 2.  One vessel coronary atherosclerosis. 3.  Aortic Atherosclerosis (  ICD10-I70.0).  ~~Proximal ascending aorta 39 mm.  CT chest w/o contrast:  02/19/2023 1. 3 mm endobronchial nodule or focal mucous plug in a right upper lobe bronchus. Since this is endobronchial, a follow-up chest CT without contrast is recommended in 6 months. 2. Borderline dilatation of the ascending thoracic aorta with a diameter of 4.0 cm. Recommend annual imaging followup by CTA or MRA.This recommendation follows 2010 ACCF/AHA/AATS/ACR/ASA/SCA/SCAI/SIR/STS/SVM Guidelines for the Diagnosis and Management of Patients with Thoracic Aortic Disease. Circulation. 2010; 121: X914-N829. Aortic aneurysm NOS (ICD10-I71.9) 3. Borderline cardiomegaly. 4. Calcific coronary artery and aortic atherosclerosis.    ALLERGIES: Allergies  Allergen Reactions   Atorvastatin Calcium     Other Reaction(s): myalgia with generic   Hydrochlorothiazide     Other Reaction(s): dizziness   Levaquin [Levofloxacin] Other (See Comments)    Tendon pain   Lisinopril Cough    Other Reaction(s): cough   Methocarbamol     Other Reaction(s): Unknown   Aspirin Rash    MEDICATION LIST PRIOR TO VISIT: Current Meds  Medication Sig   Blood Glucose Monitoring Suppl (TRUE METRIX METER) DEVI 1 each by Does not apply route 3 (three) times daily before meals.   chlorpheniramine (CHLOR-TRIMETON) 4 MG tablet Take 4 mg by mouth every 4 (four) hours as needed for allergies.   citalopram (CELEXA) 20 MG tablet Take 1 tablet (20 mg total) by mouth every morning.   dicyclomine (BENTYL) 10 MG capsule Take 10 mg by mouth 4 (four) times daily -  before meals and at bedtime.   glucose blood (TRUE METRIX BLOOD GLUCOSE TEST) test strip Use 3 times daily before meals   latanoprost (XALATAN) 0.005 % ophthalmic solution PLACE ONE DROP INTO BOTH EYES AT BEDTIME   metFORMIN (GLUCOPHAGE) 500 MG tablet TAKE 1 TABLET (500 MG TOTAL) BY MOUTH 2 TIMES DAILY WITH A MEAL.   omeprazole  (PRILOSEC) 40 MG capsule Take 40 mg by mouth in the morning and at bedtime.   PROLIA 60 MG/ML SOSY injection INJECT 60 MG Q 6 MONTHS   sucralfate (CARAFATE) 1 g tablet Take 1 tablet by mouth. On an empty stomach three times daily before meals   traZODone (DESYREL) 100 MG tablet Take 1 tablet (100 mg total) by mouth at bedtime.   TRUEPLUS LANCETS 28G MISC 1 each by Does not apply route 3 (three) times daily before meals.   [DISCONTINUED] amLODipine (NORVASC) 2.5 MG tablet Take 1 tablet (2.5 mg total) by mouth daily at 10 pm. (Patient taking differently: Take 5 mg by mouth daily at 10 pm.)   [DISCONTINUED] amLODipine (NORVASC) 5 MG tablet Take 5 mg by mouth daily.   [DISCONTINUED] atorvastatin (LIPITOR) 40 MG tablet Take 1 tablet (40 mg total) by mouth daily.   [DISCONTINUED] FARXIGA 5 MG TABS tablet Take 1 tablet by mouth daily.   [DISCONTINUED] losartan (COZAAR) 100 MG tablet Take 100 mg by mouth every morning.     PAST MEDICAL HISTORY: Past Medical History:  Diagnosis Date   Aortic insufficiency    pt unaware of this   Depression    Diabetes mellitus, type II (HCC)    Dizziness    chronic for 10 years and mild   GERD (gastroesophageal reflux disease)    H/O mammogram 2016   normal   History of nuclear stress test 10/16/2017   negative for ischemia   HLD (hyperlipidemia)    Hx of colonoscopy 2015   repeat 5 years   Hypertension    Insomnia  Personal history of colonic polyps    PUD (peptic ulcer disease)     PAST SURGICAL HISTORY: Past Surgical History:  Procedure Laterality Date   COLONOSCOPY WITH PROPOFOL N/A 09/29/2013   Procedure: COLONOSCOPY WITH PROPOFOL;  Surgeon: Charolett Bumpers, MD;  Location: [facility];  Service: Endoscopy;  Laterality: N/A;   ESOPHAGOGASTRODUODENOSCOPY (EGD) WITH PROPOFOL N/A 09/29/2013   Procedure: ESOPHAGOGASTRODUODENOSCOPY (EGD) WITH PROPOFOL;  Surgeon: Charolett Bumpers, MD;  Location: [facility];  Service: Endoscopy;  Laterality: N/A;    NO PAST SURGERIES      FAMILY HISTORY: The patient family history includes Breast cancer (age of onset: 60) in her sister; CAD in her mother; Cancer in her father; Diabetes type II in her brother and sister; Heart disease in her father; High Cholesterol in her brother and sister; Hypertension in her sister.  SOCIAL HISTORY:  The patient  reports that she has never smoked. She has never used smokeless tobacco. She reports that she does not currently use alcohol after a past usage of about 2.0 standard drinks of alcohol per week. She reports that she does not use drugs.  REVIEW OF SYSTEMS: Review of Systems  Cardiovascular:  Negative for chest pain, claudication, dyspnea on exertion, irregular heartbeat, leg swelling, near-syncope, orthopnea, palpitations, paroxysmal nocturnal dyspnea and syncope.  Respiratory:  Negative for shortness of breath.   Hematologic/Lymphatic: Negative for bleeding problem.  Musculoskeletal:  Negative for muscle cramps and myalgias.  Neurological:  Negative for dizziness and light-headedness.    PHYSICAL EXAM:    03/28/2023    3:34 PM 01/21/2023    2:49 PM 01/10/2021   10:09 AM  Vitals with BMI  Height 5\' 1"  5\' 1"  5\' 1"   Weight 106 lbs 111 lbs 3 oz 108 lbs  BMI 20.04 21.02 20.42  Systolic 152 131 409  Diastolic 75 75 60  Pulse 65 64 83    Physical Exam  Constitutional: No distress.  Age appropriate, hemodynamically stable.   Neck: No JVD present.  Cardiovascular: Normal rate, regular rhythm, S1 normal, S2 normal, intact distal pulses and normal pulses. Exam reveals no gallop, no S3 and no S4.  No murmur heard. Pulmonary/Chest: Effort normal and breath sounds normal. No stridor. She has no wheezes. She has no rales.  Abdominal: Soft. Bowel sounds are normal. She exhibits no distension. There is no abdominal tenderness.  Musculoskeletal:        General: No edema.     Cervical back: Neck supple.  Neurological: She is alert and oriented to person, place,  and time. She has intact cranial nerves (2-12).  Skin: Skin is warm and moist.   LABORATORY DATA: External Labs: Collected: August 21, 2022. AST 18, ALT 24, alkaline phosphatase 41. BUN 15, creatinine 0.98. Sodium 143, potassium 4.2, chloride 105, bicarb 30. Hemoglobin 11.9, hematocrit 35.2%. Hemoglobin A1c 6.6. TSH 1.35. Total cholesterol 140, triglycerides 111, HDL 57, LDL calculated 61, non-HDL 83  External lab:  Collected: February 20, 2023. Total cholesterol 133, triglycerides 132, HDL 58, LDL calculated 52. A1c 6.2 BUN 15, creatinine 1.02. eGFR 59. Sodium 143, potassium 4.7, chloride 103, bicarb 24. AST 24, ALT 20, alkaline phosphatase 44  IMPRESSION:    ICD-10-CM   1. Dyspnea on exertion  R06.09     2. Essential hypertension  I10 losartan (COZAAR) 100 MG tablet    FARXIGA 5 MG TABS tablet    amLODipine (NORVASC) 5 MG tablet    3. Aortic dilatation (HCC)  I77.819     4.  Nonrheumatic aortic valve insufficiency  I35.1     5. Non-insulin dependent type 2 diabetes mellitus (HCC)  E11.9     6. Dyslipidemia  E78.5 atorvastatin (LIPITOR) 40 MG tablet        RECOMMENDATIONS: Danielle Richard is a 72 y.o. [name] female whose past medical history and cardiac risk factors include: Hypertension, hyperlipidemia, non-insulin-dependent diabetes mellitus type 2, GERD, peptic ulcer disease, glaucoma, Aortic atherosclerosis, dilated ascending aorta, postmenopausal female, advanced age.  Dyspnea on exertion Chronic and stable. Echo 03/13/2023: LVEF 60 to 65%, normal diastolic function, see report for additional details. MPI July 2024: Low risk study Precordial discomfort is predominantly noncardiac and likely secondary to heartburn/GI related.  Daughter states that she did have a history of gastric ulcer in the past.  I have asked her to have her reevaluated by gastroenterology for now.  Essential hypertension Office blood pressures are not at goal. All blood pressures are very  well-controlled. Medications reconciled. Refills provided. Reemphasized importance of low-salt diet. If additional antihypertensive medications are needed consider up titration of [place].  Aortic dilatation (HCC) Nonrheumatic aortic valve insufficiency CT chest May 2021: Ascending thoracic aorta 39 mm. CT chest July 2024: Ascending thoracic aorta 40 mm Please good correlation between CT of the chest and echocardiography and therefore would recommend following up with echocardiogram for longitudinal surveillance.  However, if there is a clinically significant delta change in the aortic dimensions CT of the chest would Danielle Richard further warranted. Most recent echocardiogram notes severity of aortic to Danielle Richard mild to moderate. Currently asymptomatic.  Non-insulin dependent type 2 diabetes mellitus (HCC) Reemphasized importance of glycemic control. Currently managed by primary care provider.  Dyslipidemia Outside labs independently reviewed. Statin medications refilled. Recommend a goal LDL <70 mg/dL.  Her daughter was available during today's encounter to translate between [name] and [place].  She also provided collateral history as part of today's encounter.  FINAL MEDICATION LIST END OF ENCOUNTER: Meds ordered this encounter  Medications   losartan (COZAAR) 100 MG tablet    Sig: Take 1 tablet (100 mg total) by mouth every morning.    Dispense:  180 tablet    Refill:  0   FARXIGA 5 MG TABS tablet    Sig: Take 1 tablet (5 mg total) by mouth every morning.    Dispense:  90 tablet    Refill:  0   amLODipine (NORVASC) 5 MG tablet    Sig: Take 1 tablet (5 mg total) by mouth daily at 10 pm.    Dispense:  90 tablet    Refill:  1   atorvastatin (LIPITOR) 40 MG tablet    Sig: Take 1 tablet (40 mg total) by mouth daily.    Dispense:  90 tablet    Refill:  1    Medications Discontinued During This Encounter  Medication Reason   triamterene (DYRENIUM) 50 MG capsule    amLODipine (NORVASC)  2.5 MG tablet    atorvastatin (LIPITOR) 40 MG tablet Reorder   losartan (COZAAR) 100 MG tablet Reorder   FARXIGA 5 MG TABS tablet Reorder   amLODipine (NORVASC) 5 MG tablet Reorder     Current Outpatient Medications:    Blood Glucose Monitoring Suppl (TRUE METRIX METER) DEVI, 1 each by Does not apply route 3 (three) times daily before meals., Disp: 1 Device, Rfl: 0   chlorpheniramine (CHLOR-TRIMETON) 4 MG tablet, Take 4 mg by mouth every 4 (four) hours as needed for allergies., Disp: , Rfl:    citalopram (CELEXA) 20  MG tablet, Take 1 tablet (20 mg total) by mouth every morning., Disp: 60 tablet, Rfl: 3   dicyclomine (BENTYL) 10 MG capsule, Take 10 mg by mouth 4 (four) times daily -  before meals and at bedtime., Disp: , Rfl:    glucose blood (TRUE METRIX BLOOD GLUCOSE TEST) test strip, Use 3 times daily before meals, Disp: 60 each, Rfl: 12   latanoprost (XALATAN) 0.005 % ophthalmic solution, PLACE ONE DROP INTO BOTH EYES AT BEDTIME, Disp: , Rfl: 4   metFORMIN (GLUCOPHAGE) 500 MG tablet, TAKE 1 TABLET (500 MG TOTAL) BY MOUTH 2 TIMES DAILY WITH A MEAL., Disp: 60 tablet, Rfl: 5   omeprazole (PRILOSEC) 40 MG capsule, Take 40 mg by mouth in the morning and at bedtime., Disp: , Rfl:    PROLIA 60 MG/ML SOSY injection, INJECT 60 MG Q 6 MONTHS, Disp: , Rfl: 0   sucralfate (CARAFATE) 1 g tablet, Take 1 tablet by mouth. On an empty stomach three times daily before meals, Disp: , Rfl: 4   traZODone (DESYREL) 100 MG tablet, Take 1 tablet (100 mg total) by mouth at bedtime., Disp: 30 tablet, Rfl: 5   TRUEPLUS LANCETS 28G MISC, 1 each by Does not apply route 3 (three) times daily before meals., Disp: 100 each, Rfl: 12   amLODipine (NORVASC) 5 MG tablet, Take 1 tablet (5 mg total) by mouth daily at 10 pm., Disp: 90 tablet, Rfl: 1   atorvastatin (LIPITOR) 40 MG tablet, Take 1 tablet (40 mg total) by mouth daily., Disp: 90 tablet, Rfl: 1   FARXIGA 5 MG TABS tablet, Take 1 tablet (5 mg total) by mouth every  morning., Disp: 90 tablet, Rfl: 0   losartan (COZAAR) 100 MG tablet, Take 1 tablet (100 mg total) by mouth every morning., Disp: 180 tablet, Rfl: 0  No orders of the defined types were placed in this encounter.   There are no Patient Instructions on file for this visit.   --Continue cardiac medications as reconciled in final medication list. --Return in about 6 months (around 09/28/2023) for Follow up blood pressure, aortic regurgitation, ascending aorta dilatation. or sooner if needed. --Continue follow-up with your primary care physician regarding the management of your other chronic comorbid conditions.  Patient's questions and concerns were addressed to her satisfaction. She voices understanding of the instructions provided during this encounter.   This note was created using a voice recognition software as a result there may Perel grammatical errors inadvertently enclosed that do not reflect the nature of this encounter. Every attempt is made to correct such errors.  Tessa Lerner, [place], [facility]  Pager:  [phone] Office: [phone]

## 2023-03-29 DIAGNOSIS — H9121 Sudden idiopathic hearing loss, right ear: Secondary | ICD-10-CM | POA: Diagnosis not present

## 2023-03-29 DIAGNOSIS — H9311 Tinnitus, right ear: Secondary | ICD-10-CM | POA: Diagnosis not present

## 2023-04-09 ENCOUNTER — Telehealth: Payer: Self-pay

## 2023-04-09 NOTE — Telephone Encounter (Signed)
Daughter is calling wanting to know if referral has been received. Initial referral was incorrect so the appointment was cancelled. [phone]

## 2023-04-10 ENCOUNTER — Institutional Professional Consult (permissible substitution): Payer: 59 | Admitting: Pulmonary Disease

## 2023-04-11 NOTE — Telephone Encounter (Signed)
Referral has been received. Nothing further needed.

## 2023-04-12 DIAGNOSIS — H9121 Sudden idiopathic hearing loss, right ear: Secondary | ICD-10-CM | POA: Diagnosis not present

## 2023-04-12 DIAGNOSIS — H9311 Tinnitus, right ear: Secondary | ICD-10-CM | POA: Diagnosis not present

## 2023-04-12 DIAGNOSIS — R42 Dizziness and giddiness: Secondary | ICD-10-CM | POA: Diagnosis not present

## 2023-04-17 NOTE — Progress Notes (Unsigned)
Synopsis: Referred in Sept 2024 for lung nodule by Linus Galas, NP  Subjective:   PATIENT ID: Danielle Richard GENDER: female DOB: 01/02/1951, MRN: [id number]  Chief Complaint  Patient presents with   Consult    Consult on Pulmonary nodule.    This is a 72 year old female, past medical history of hyperlipidemia, hypertension, diabetes type 2.  Patient was referred for evaluation of abnormal CT chest found to have a right upper lobe 3 mm nodule which appears to have a partial endobronchial component.  From respiratory standpoint patient is fine she does have cough at nighttime but she has multiple reflux symptoms and takes several medications to help with this.  History is obtained from the patient's both daughters which helped translate as she speaks [name]  However she is able to complete her activities of daily living.  No prior malignancies and no family history of lung cancer.    Past Medical History:  Diagnosis Date   Aortic insufficiency    pt unaware of this   Depression    Diabetes mellitus, type II (HCC)    Dizziness    chronic for 10 years and mild   GERD (gastroesophageal reflux disease)    H/O mammogram 2016   normal   History of nuclear stress test 10/16/2017   negative for ischemia   HLD (hyperlipidemia)    Hx of colonoscopy 2015   repeat 5 years   Hypertension    Insomnia    Personal history of colonic polyps    PUD (peptic ulcer disease)      Family History  Problem Relation Age of Onset   CAD Mother    Heart disease Father    Cancer Father    Hypertension Sister    High Cholesterol Sister    Diabetes type II Sister    Breast cancer Sister 109   High Cholesterol Brother    Diabetes type II Brother      Past Surgical History:  Procedure Laterality Date   COLONOSCOPY WITH PROPOFOL N/A 09/29/2013   Procedure: COLONOSCOPY WITH PROPOFOL;  Surgeon: Charolett Bumpers, MD;  Location: [facility];  Service: Endoscopy;  Laterality: N/A;    ESOPHAGOGASTRODUODENOSCOPY (EGD) WITH PROPOFOL N/A 09/29/2013   Procedure: ESOPHAGOGASTRODUODENOSCOPY (EGD) WITH PROPOFOL;  Surgeon: Charolett Bumpers, MD;  Location: [facility];  Service: Endoscopy;  Laterality: N/A;   NO PAST SURGERIES      Social History   Socioeconomic History   Marital status: Married    Spouse name: Not on file   Number of children: 3   Years of education: Not on file   Highest education level: High school graduate  Occupational History   Not on file  Tobacco Use   Smoking status: Never   Smokeless tobacco: Never  Vaping Use   Vaping status: Never Used  Substance and Sexual Activity   Alcohol use: Not Currently    Alcohol/week: 2.0 standard drinks of alcohol    Types: 1 Glasses of wine, 1 Cans of beer per week    Comment: socially   Drug use: No   Sexual activity: Not on file  Other Topics Concern   Not on file  Social History Narrative   Lives with husband and child   Right handed   Social Determinants of Health   Financial Resource Strain: Not on file  Food Insecurity: Low Risk  (04/12/2023)   Received from [facility]   Hunger Vital Sign    Worried About Running Out of  Food in the Last Year: Never true    Ran Out of Food in the Last Year: Never true  Transportation Needs: No Transportation Needs (04/12/2023)   Received from [facility]    In the past 12 months, has lack of reliable transportation kept you from medical appointments, meetings, work or from getting things needed for daily living? : No  Physical Activity: Not on file  Stress: Not on file  Social Connections: Not on file  Intimate Partner Violence: Not on file     Allergies  Allergen Reactions   Atorvastatin Calcium     Other Reaction(s): myalgia with generic   Hydrochlorothiazide     Other Reaction(s): dizziness   Levaquin [Levofloxacin] Other (See Comments)    Tendon pain   Lisinopril Cough    Other Reaction(s): cough   Methocarbamol     Other  Reaction(s): Unknown   Aspirin Rash     Outpatient Medications Prior to Visit  Medication Sig Dispense Refill   amLODipine (NORVASC) 5 MG tablet Take 1 tablet (5 mg total) by mouth daily at 10 pm. 90 tablet 1   atorvastatin (LIPITOR) 40 MG tablet Take 1 tablet (40 mg total) by mouth daily. 90 tablet 1   Blood Glucose Monitoring Suppl (TRUE METRIX METER) DEVI 1 each by Does not apply route 3 (three) times daily before meals. 1 Device 0   chlorpheniramine (CHLOR-TRIMETON) 4 MG tablet Take 4 mg by mouth every 4 (four) hours as needed for allergies.     citalopram (CELEXA) 20 MG tablet Take 1 tablet (20 mg total) by mouth every morning. 60 tablet 3   dicyclomine (BENTYL) 10 MG capsule Take 10 mg by mouth 4 (four) times daily -  before meals and at bedtime.     FARXIGA 5 MG TABS tablet Take 1 tablet (5 mg total) by mouth every morning. 90 tablet 0   glucose blood (TRUE METRIX BLOOD GLUCOSE TEST) test strip Use 3 times daily before meals 60 each 12   latanoprost (XALATAN) 0.005 % ophthalmic solution PLACE ONE DROP INTO BOTH EYES AT BEDTIME  4   losartan (COZAAR) 100 MG tablet Take 1 tablet (100 mg total) by mouth every morning. 180 tablet 0   metFORMIN (GLUCOPHAGE) 500 MG tablet TAKE 1 TABLET (500 MG TOTAL) BY MOUTH 2 TIMES DAILY WITH A MEAL. 60 tablet 5   omeprazole (PRILOSEC) 40 MG capsule Take 40 mg by mouth in the morning and at bedtime.     PROLIA 60 MG/ML SOSY injection INJECT 60 MG Q 6 MONTHS  0   sucralfate (CARAFATE) 1 g tablet Take 1 tablet by mouth. On an empty stomach three times daily before meals  4   traZODone (DESYREL) 100 MG tablet Take 1 tablet (100 mg total) by mouth at bedtime. 30 tablet 5   TRUEPLUS LANCETS 28G MISC 1 each by Does not apply route 3 (three) times daily before meals. 100 each 12   No facility-administered medications prior to visit.    Review of Systems  Constitutional:  Negative for chills, fever, malaise/fatigue and weight loss.  HENT:  Negative for hearing  loss, sore throat and tinnitus.   Eyes:  Negative for blurred vision and double vision.  Respiratory:  Negative for cough, hemoptysis, sputum production, shortness of breath, wheezing and stridor.   Cardiovascular:  Negative for chest pain, palpitations, orthopnea, leg swelling and PND.  Gastrointestinal:  Negative for abdominal pain, constipation, diarrhea, heartburn, nausea and vomiting.  Genitourinary:  Negative for dysuria, hematuria and urgency.  Musculoskeletal:  Negative for joint pain and myalgias.  Skin:  Negative for itching and rash.  Neurological:  Negative for dizziness, tingling, weakness and headaches.  Endo/Heme/Allergies:  Negative for environmental allergies. Does not bruise/bleed easily.  Psychiatric/Behavioral:  Negative for depression. The patient is not nervous/anxious and does not have insomnia.   All other systems reviewed and are negative.    Objective:  Physical Exam Vitals reviewed.  Constitutional:      General: She is not in acute distress.    Appearance: She is well-developed.  HENT:     Head: Normocephalic and atraumatic.  Eyes:     General: No scleral icterus.    Conjunctiva/sclera: Conjunctivae normal.     Pupils: Pupils are equal, round, and reactive to light.  Neck:     Vascular: No JVD.     Trachea: No tracheal deviation.  Cardiovascular:     Rate and Rhythm: Normal rate and regular rhythm.     Heart sounds: Normal heart sounds. No murmur heard. Pulmonary:     Effort: Pulmonary effort is normal. No tachypnea, accessory muscle usage or respiratory distress.     Breath sounds: No stridor. No wheezing, rhonchi or rales.  Abdominal:     General: There is no distension.     Palpations: Abdomen is soft.     Tenderness: There is no abdominal tenderness.  Musculoskeletal:        General: No tenderness.     Cervical back: Neck supple.  Lymphadenopathy:     Cervical: No cervical adenopathy.  Skin:    General: Skin is warm and dry.     Capillary  Refill: Capillary refill takes less than 2 seconds.     Findings: No rash.  Neurological:     Mental Status: She is alert and oriented to person, place, and time.  Psychiatric:        Behavior: Behavior normal.      Vitals:   04/18/23 0856  BP: 120/60  Pulse: 69  SpO2: 98%  Weight: 107 lb (48.5 kg)  Height: 5\' 1"  (1.549 m)   98% on RA BMI Readings from Last 3 Encounters:  04/18/23 20.22 kg/m  03/28/23 20.03 kg/m  01/21/23 21.01 kg/m   Wt Readings from Last 3 Encounters:  04/18/23 107 lb (48.5 kg)  03/28/23 106 lb (48.1 kg)  01/21/23 111 lb 3.2 oz (50.4 kg)     CBC    Component Value Date/Time   WBC 4.9 11/30/2019 1003   RBC 4.10 11/30/2019 1003   HGB 12.7 11/30/2019 1003   HCT 37.3 11/30/2019 1003   PLT 313.0 11/30/2019 1003   MCV 91.0 11/30/2019 1003   MCH 30.3 09/09/2013 1036   MCHC 34.2 11/30/2019 1003   RDW 13.8 11/30/2019 1003   LYMPHSABS 1.1 11/30/2019 1003   MONOABS 0.5 11/30/2019 1003   EOSABS 0.1 11/30/2019 1003   BASOSABS 0.0 11/30/2019 1003     Chest Imaging:  CT scan of the chest July 2024: 3 mm endobronchial nodule possibly a mucous focus in the right upper lobe.  Otherwise no other significant nodules.  Borderline aortic dilatation. The patient's images have been independently reviewed by me.    Pulmonary Functions Testing Results:    Latest Ref Rng & Units 10/16/2017    9:57 AM  PFT Results  FVC-Pre L 2.44   FVC-Predicted Pre % 88   Pre FEV1/FVC % % 88   FEV1-Pre L 2.15   FEV1-Predicted Pre %  102   DLCO uncorrected ml/min/mmHg 17.87   DLCO UNC% % 88   DLVA Predicted % 115   TLC L 5.67   TLC % Predicted % 123   RV % Predicted % 176     FeNO:   Pathology:   Echocardiogram:   Heart Catheterization:     Assessment & Plan:     ICD-10-CM   1. Lung nodule  R91.1 CT Chest Wo Contrast    2. Nonsmoker  Z78.9       Discussion:  This is a 72 year old female, found to have a 3 mm right upper lobe endobronchial nodule.   Lifelong non-smoker no history of malignancies.  Would Tandra considered low risk.  Plan: Repeat noncontrast CT in 6 months to see if it clears. If it clears she will likely need no additional follow-up. I do think is reasonable to follow-up the nodule as she is patient female which carries a 1% risk of the development of lung cancer related to non-smokers.  Repeat CT in 6 months and follow-up with APP after that.   Current Outpatient Medications:    amLODipine (NORVASC) 5 MG tablet, Take 1 tablet (5 mg total) by mouth daily at 10 pm., Disp: 90 tablet, Rfl: 1   atorvastatin (LIPITOR) 40 MG tablet, Take 1 tablet (40 mg total) by mouth daily., Disp: 90 tablet, Rfl: 1   Blood Glucose Monitoring Suppl (TRUE METRIX METER) DEVI, 1 each by Does not apply route 3 (three) times daily before meals., Disp: 1 Device, Rfl: 0   chlorpheniramine (CHLOR-TRIMETON) 4 MG tablet, Take 4 mg by mouth every 4 (four) hours as needed for allergies., Disp: , Rfl:    citalopram (CELEXA) 20 MG tablet, Take 1 tablet (20 mg total) by mouth every morning., Disp: 60 tablet, Rfl: 3   dicyclomine (BENTYL) 10 MG capsule, Take 10 mg by mouth 4 (four) times daily -  before meals and at bedtime., Disp: , Rfl:    FARXIGA 5 MG TABS tablet, Take 1 tablet (5 mg total) by mouth every morning., Disp: 90 tablet, Rfl: 0   glucose blood (TRUE METRIX BLOOD GLUCOSE TEST) test strip, Use 3 times daily before meals, Disp: 60 each, Rfl: 12   latanoprost (XALATAN) 0.005 % ophthalmic solution, PLACE ONE DROP INTO BOTH EYES AT BEDTIME, Disp: , Rfl: 4   losartan (COZAAR) 100 MG tablet, Take 1 tablet (100 mg total) by mouth every morning., Disp: 180 tablet, Rfl: 0   metFORMIN (GLUCOPHAGE) 500 MG tablet, TAKE 1 TABLET (500 MG TOTAL) BY MOUTH 2 TIMES DAILY WITH A MEAL., Disp: 60 tablet, Rfl: 5   omeprazole (PRILOSEC) 40 MG capsule, Take 40 mg by mouth in the morning and at bedtime., Disp: , Rfl:    PROLIA 60 MG/ML SOSY injection, INJECT 60 MG Q 6 MONTHS,  Disp: , Rfl: 0   sucralfate (CARAFATE) 1 g tablet, Take 1 tablet by mouth. On an empty stomach three times daily before meals, Disp: , Rfl: 4   traZODone (DESYREL) 100 MG tablet, Take 1 tablet (100 mg total) by mouth at bedtime., Disp: 30 tablet, Rfl: 5   TRUEPLUS LANCETS 28G MISC, 1 each by Does not apply route 3 (three) times daily before meals., Disp: 100 each, Rfl: 12   Josephine Igo, DO [facility] 04/18/2023 10:05 AM

## 2023-04-18 ENCOUNTER — Ambulatory Visit (INDEPENDENT_AMBULATORY_CARE_PROVIDER_SITE_OTHER): Payer: 59 | Admitting: Pulmonary Disease

## 2023-04-18 ENCOUNTER — Encounter: Payer: Self-pay | Admitting: Pulmonary Disease

## 2023-04-18 VITALS — BP 120/60 | HR 69 | Ht 61.0 in | Wt 107.0 lb

## 2023-04-18 DIAGNOSIS — Z789 Other specified health status: Secondary | ICD-10-CM

## 2023-04-18 DIAGNOSIS — R911 Solitary pulmonary nodule: Secondary | ICD-10-CM

## 2023-04-18 NOTE — Patient Instructions (Signed)
Thank you for visiting Dr. Tonia Brooms at [facility]. Today we recommend the following: Orders Placed This Encounter  Procedures   CT Chest Wo Contrast   Return in about 6 months (around 10/16/2023) for after CT Chest, with APP.    Please do your part to reduce the spread of COVID-19.

## 2023-05-15 DIAGNOSIS — R42 Dizziness and giddiness: Secondary | ICD-10-CM | POA: Diagnosis not present

## 2023-05-15 DIAGNOSIS — H9311 Tinnitus, right ear: Secondary | ICD-10-CM | POA: Diagnosis not present

## 2023-05-15 DIAGNOSIS — H90A21 Sensorineural hearing loss, unilateral, right ear, with restricted hearing on the contralateral side: Secondary | ICD-10-CM | POA: Diagnosis not present

## 2023-06-10 ENCOUNTER — Encounter: Payer: Self-pay | Admitting: Emergency Medicine

## 2023-06-14 DIAGNOSIS — M810 Age-related osteoporosis without current pathological fracture: Secondary | ICD-10-CM | POA: Diagnosis not present

## 2023-07-03 DIAGNOSIS — G4730 Sleep apnea, unspecified: Secondary | ICD-10-CM | POA: Diagnosis not present

## 2023-07-17 ENCOUNTER — Telehealth: Payer: Self-pay | Admitting: Pulmonary Disease

## 2023-07-17 NOTE — Telephone Encounter (Signed)
Patient would like next CT Scan to [place] scheduled at [facility] in the morning on a Tuesday or Wednesday. Please call to schedule [phone]

## 2023-08-06 DIAGNOSIS — H401212 Low-tension glaucoma, right eye, moderate stage: Secondary | ICD-10-CM | POA: Diagnosis not present

## 2023-08-06 DIAGNOSIS — H401223 Low-tension glaucoma, left eye, severe stage: Secondary | ICD-10-CM | POA: Diagnosis not present

## 2023-08-15 DIAGNOSIS — G4733 Obstructive sleep apnea (adult) (pediatric): Secondary | ICD-10-CM | POA: Diagnosis not present

## 2023-08-16 DIAGNOSIS — G4733 Obstructive sleep apnea (adult) (pediatric): Secondary | ICD-10-CM | POA: Diagnosis not present

## 2023-09-26 ENCOUNTER — Ambulatory Visit: Payer: Self-pay | Admitting: Cardiology

## 2023-10-08 ENCOUNTER — Ambulatory Visit (HOSPITAL_COMMUNITY): Payer: 59

## 2023-10-09 ENCOUNTER — Ambulatory Visit: Payer: 59 | Admitting: Pulmonary Disease

## 2023-10-14 ENCOUNTER — Ambulatory Visit: Payer: 59 | Admitting: Pulmonary Disease

## 2023-10-15 ENCOUNTER — Ambulatory Visit (HOSPITAL_COMMUNITY): Payer: 59

## 2023-10-16 ENCOUNTER — Ambulatory Visit: Payer: 59 | Admitting: Cardiology

## 2023-10-30 ENCOUNTER — Ambulatory Visit (HOSPITAL_COMMUNITY)
Admission: RE | Admit: 2023-10-30 | Discharge: 2023-10-30 | Disposition: A | Discharge status: Home / Self Care | Payer: 59 | Source: Ambulatory Visit | Attending: Pulmonary Disease | Admitting: Pulmonary Disease

## 2023-10-30 DIAGNOSIS — I700 Atherosclerosis of aorta: Secondary | ICD-10-CM | POA: Diagnosis not present

## 2023-10-30 DIAGNOSIS — E119 Type 2 diabetes mellitus without complications: Secondary | ICD-10-CM | POA: Diagnosis not present

## 2023-10-30 DIAGNOSIS — E785 Hyperlipidemia, unspecified: Secondary | ICD-10-CM | POA: Diagnosis not present

## 2023-10-30 DIAGNOSIS — M810 Age-related osteoporosis without current pathological fracture: Secondary | ICD-10-CM | POA: Diagnosis not present

## 2023-10-30 DIAGNOSIS — R911 Solitary pulmonary nodule: Secondary | ICD-10-CM | POA: Insufficient documentation

## 2023-11-06 ENCOUNTER — Encounter: Payer: Self-pay | Admitting: Emergency Medicine

## 2023-11-06 ENCOUNTER — Ambulatory Visit: Payer: 59 | Admitting: Emergency Medicine

## 2023-11-06 VITALS — BP 163/79 | HR 61 | Ht 61.0 in | Wt 107.4 lb

## 2023-11-06 DIAGNOSIS — L239 Allergic contact dermatitis, unspecified cause: Secondary | ICD-10-CM | POA: Diagnosis not present

## 2023-11-06 DIAGNOSIS — R911 Solitary pulmonary nodule: Secondary | ICD-10-CM | POA: Diagnosis not present

## 2023-11-06 DIAGNOSIS — G4733 Obstructive sleep apnea (adult) (pediatric): Secondary | ICD-10-CM | POA: Diagnosis not present

## 2023-11-06 DIAGNOSIS — E559 Vitamin D deficiency, unspecified: Secondary | ICD-10-CM | POA: Diagnosis not present

## 2023-11-06 DIAGNOSIS — Z0000 Encounter for general adult medical examination without abnormal findings: Secondary | ICD-10-CM | POA: Diagnosis not present

## 2023-11-06 NOTE — Patient Instructions (Addendum)
 We reviewed your CT scan of the chest today.  The small pulmonary nodule that had been seen on your previous scan has resolved, consistent with a transient mucous plugging.  This is good news.  You do not need any further scans. Follow-up with Dr. Delton Coombes for any changes in your breathing or any other respiratory problems.   Hm nay chng ti ? xem l?i phim ch?p CT ng?c c?a b?n. U ph?i nh? ? ???c nhn th?y trong l?n ch?p tr??c c?a b?n ? bi?n m?t, ph h?p v?i tnh tr?ng t?c ngh?n nim m?c t?m th?i. ?y l tin t?t. B?n khng c?n ch?p thm b?t k? phim ch?p no n?a. Hy theo di v?i Bc s? Hyrum Shaneyfelt ?? bi?t b?t k? thay ??i no v? h?i th? ho?c b?t k? v?n ?? h h?p no khc.

## 2023-11-06 NOTE — Assessment & Plan Note (Signed)
 Small right upper lobe endobronchial pulmonary nodule that is resolved on repeat imaging 10/30/2023.  Reassured her about this.  She is a low risk patient, does not need any follow-up scans.  She can follow-up as needed

## 2023-11-06 NOTE — Progress Notes (Signed)
 Subjective:    Patient ID: Danielle Richard, female    DOB: 03/31/1951, 73 y.o.   MRN: [id number]  HPI 73 year old [name] woman, never smoker, who has been seen by Dr. Tonia Brooms in our office for an abnormal CT scan of the chest that showed a 3 mm right upper lobe nodule with a questionable endobronchial component.  No history of malignancy or family history of malignancy. She feels well. No SOB, able to exert. Minimal cough at night.   CT chest performed 10/30/2023 reviewed by me showed that the 3 mm endobronchial nodule has resolved consistent with mucous plugging.  No suspicious or indeterminate nodules identified.   Review of Systems As per HPI  Past Medical History:  Diagnosis Date   Aortic insufficiency    pt unaware of this   Depression    Diabetes mellitus, type II (HCC)    Dizziness    chronic for 10 years and mild   GERD (gastroesophageal reflux disease)    H/O mammogram 2016   normal   History of nuclear stress test 10/16/2017   negative for ischemia   HLD (hyperlipidemia)    Hx of colonoscopy 2015   repeat 5 years   Hypertension    Insomnia    Personal history of colonic polyps    PUD (peptic ulcer disease)      Family History  Problem Relation Age of Onset   CAD Mother    Heart disease Father    Cancer Father    Hypertension Sister    High Cholesterol Sister    Diabetes type II Sister    Breast cancer Sister 43   High Cholesterol Brother    Diabetes type II Brother      Social History   Socioeconomic History   Marital status: Married    Spouse name: Not on file   Number of children: 3   Years of education: Not on file   Highest education level: High school graduate  Occupational History   Not on file  Tobacco Use   Smoking status: Never   Smokeless tobacco: Never  Vaping Use   Vaping status: Never Used  Substance and Sexual Activity   Alcohol use: Not Currently    Alcohol/week: 2.0 standard drinks of alcohol    Types: 1 Glasses of wine, 1  Cans of beer per week    Comment: socially   Drug use: No   Sexual activity: Not on file  Other Topics Concern   Not on file  Social History Narrative   Lives with husband and child   Right handed   Social Drivers of Health   Financial Resource Strain: Not on file  Food Insecurity: Low Risk  (04/12/2023)   Received from [facility]   Hunger Vital Sign    Worried About Running Out of Food in the Last Year: Never true    Ran Out of Food in the Last Year: Never true  Transportation Needs: No Transportation Needs (04/12/2023)   Received from [facility]    In the past 12 months, has lack of reliable transportation kept you from medical appointments, meetings, work or from getting things needed for daily living? : No  Physical Activity: Not on file  Stress: Not on file  Social Connections: Not on file  Intimate Partner Violence: Not on file     Allergies  Allergen Reactions   Atorvastatin Calcium     Other Reaction(s): myalgia with generic   Hydrochlorothiazide  Other Reaction(s): dizziness   Levaquin [Levofloxacin] Other (See Comments)    Tendon pain   Lisinopril Cough    Other Reaction(s): cough   Methocarbamol     Other Reaction(s): Unknown   Aspirin Rash     Outpatient Medications Prior to Visit  Medication Sig Dispense Refill   Blood Glucose Monitoring Suppl (TRUE METRIX METER) DEVI 1 each by Does not apply route 3 (three) times daily before meals. 1 Device 0   chlorpheniramine (CHLOR-TRIMETON) 4 MG tablet Take 4 mg by mouth every 4 (four) hours as needed for allergies.     citalopram (CELEXA) 20 MG tablet Take 1 tablet (20 mg total) by mouth every morning. 60 tablet 3   dicyclomine (BENTYL) 10 MG capsule Take 10 mg by mouth 4 (four) times daily -  before meals and at bedtime.     glucose blood (TRUE METRIX BLOOD GLUCOSE TEST) test strip Use 3 times daily before meals 60 each 12   latanoprost (XALATAN) 0.005 % ophthalmic solution PLACE ONE DROP  INTO BOTH EYES AT BEDTIME  4   metFORMIN (GLUCOPHAGE) 500 MG tablet TAKE 1 TABLET (500 MG TOTAL) BY MOUTH 2 TIMES DAILY WITH A MEAL. 60 tablet 5   omeprazole (PRILOSEC) 40 MG capsule Take 40 mg by mouth in the morning and at bedtime.     PROLIA 60 MG/ML SOSY injection INJECT 60 MG Q 6 MONTHS  0   sucralfate (CARAFATE) 1 g tablet Take 1 tablet by mouth. On an empty stomach three times daily before meals  4   traZODone (DESYREL) 100 MG tablet Take 1 tablet (100 mg total) by mouth at bedtime. 30 tablet 5   TRUEPLUS LANCETS 28G MISC 1 each by Does not apply route 3 (three) times daily before meals. 100 each 12   amLODipine (NORVASC) 5 MG tablet Take 1 tablet (5 mg total) by mouth daily at 10 pm. 90 tablet 1   atorvastatin (LIPITOR) 40 MG tablet Take 1 tablet (40 mg total) by mouth daily. 90 tablet 1   lisinopril (ZESTRIL) 10 MG tablet Take 10 mg by mouth daily. (Patient not taking: Reported on 11/06/2023)     losartan (COZAAR) 100 MG tablet Take 1 tablet (100 mg total) by mouth every morning. 180 tablet 0   No facility-administered medications prior to visit.        Objective:   Physical Exam Vitals:   11/06/23 1543 11/06/23 1544  BP: (!) 158/73 (!) 163/79  Pulse: 61   SpO2: 98%   Weight: 107 lb 6.4 oz (48.7 kg)   Height: 5\' 1"  (1.549 m)     Gen: Pleasant, well-nourished, in no distress,  normal affect  ENT: No lesions,  mouth clear,  oropharynx clear, no postnasal drip  Neck: No JVD, no stridor  Lungs: No use of accessory muscles, no crackles or wheezing on normal respiration, no wheeze on forced expiration  Cardiovascular: RRR, heart sounds normal, no murmur or gallops, no peripheral edema  Musculoskeletal: No deformities, no cyanosis or clubbing  Neuro: alert, awake, non focal  Skin: Warm, no lesions or rash      Assessment & Plan:   Pulmonary nodule Small right upper lobe endobronchial pulmonary nodule that is resolved on repeat imaging 10/30/2023.  Reassured her about  this.  She is a low risk patient, does not need any follow-up scans.  She can follow-up as needed   Levy Pupa, MD, PhD 11/06/2023, 4:02 PM [facility] [phone] or if  no answer before 7:00PM call [phone] For any issues after 7:00PM please call eLink [phone]

## 2023-11-13 DIAGNOSIS — H401223 Low-tension glaucoma, left eye, severe stage: Secondary | ICD-10-CM | POA: Diagnosis not present

## 2023-11-13 DIAGNOSIS — H401212 Low-tension glaucoma, right eye, moderate stage: Secondary | ICD-10-CM | POA: Diagnosis not present

## 2023-11-19 ENCOUNTER — Telehealth: Payer: Self-pay

## 2023-11-19 DIAGNOSIS — K219 Gastro-esophageal reflux disease without esophagitis: Secondary | ICD-10-CM | POA: Diagnosis not present

## 2023-11-19 DIAGNOSIS — Z1211 Encounter for screening for malignant neoplasm of colon: Secondary | ICD-10-CM | POA: Diagnosis not present

## 2023-11-19 DIAGNOSIS — I77819 Aortic ectasia, unspecified site: Secondary | ICD-10-CM | POA: Diagnosis not present

## 2023-11-19 DIAGNOSIS — I351 Nonrheumatic aortic (valve) insufficiency: Secondary | ICD-10-CM | POA: Diagnosis not present

## 2023-11-19 NOTE — Telephone Encounter (Signed)
   Pre-operative Risk Assessment    Patient Name: Danielle Richard  DOB: Sep 01, 1950 MRN: [id number]   Date of last office visit: 03/28/23 Date of next office visit: 11/20/23   Request for Surgical Clearance    Procedure:   Colonoscopy/endoscopy   Date of Surgery:  Clearance 12/18/23                                 Surgeon:  Dr. Genell Ken Surgeon's Group or Practice Name:  [facility]  Phone number:  [phone] Fax number:  [phone]   Type of Clearance Requested:   - Medical    Type of Anesthesia:   propofol    Additional requests/questions:    Danielle Richard   11/19/2023, 4:14 PM

## 2023-11-19 NOTE — Telephone Encounter (Signed)
   Name: Danielle Richard  DOB: [birth year]  MRN: [id number]  Primary Cardiologist: Avery Bodo, MD  Chart reviewed as part of pre-operative protocol coverage. The patient has an upcoming visit scheduled with Dr. Albert Huff on 11/20/2023 at which time clearance can [name] addressed in case there are any issues that would impact surgical recommendations.  Colonoscopy/endoscopy is not scheduled until 12/18/2023 as below. I added preop FYI to appointment note so that provider is aware to address at time of outpatient visit.  Per office protocol the cardiology provider should forward their finalized clearance decision and recommendations regarding antiplatelet therapy to the requesting party below.    I will route this message as FYI to requesting party and remove this message from the preop box as separate preop APP input not needed at this time.   Please call with any questions.  [name], NP  11/19/2023, 4:43 PM

## 2023-11-20 ENCOUNTER — Encounter: Payer: Self-pay | Admitting: Cardiology

## 2023-11-20 ENCOUNTER — Ambulatory Visit: Attending: Cardiology | Admitting: Cardiology

## 2023-11-20 VITALS — BP 128/69 | HR 67 | Resp 16 | Ht 61.0 in | Wt 103.0 lb

## 2023-11-20 DIAGNOSIS — E119 Type 2 diabetes mellitus without complications: Secondary | ICD-10-CM | POA: Diagnosis not present

## 2023-11-20 DIAGNOSIS — I10 Essential (primary) hypertension: Secondary | ICD-10-CM | POA: Diagnosis not present

## 2023-11-20 DIAGNOSIS — I351 Nonrheumatic aortic (valve) insufficiency: Secondary | ICD-10-CM | POA: Diagnosis not present

## 2023-11-20 DIAGNOSIS — I77819 Aortic ectasia, unspecified site: Secondary | ICD-10-CM | POA: Diagnosis not present

## 2023-11-20 DIAGNOSIS — E785 Hyperlipidemia, unspecified: Secondary | ICD-10-CM

## 2023-11-20 DIAGNOSIS — Z01810 Encounter for preprocedural cardiovascular examination: Secondary | ICD-10-CM

## 2023-11-20 NOTE — Progress Notes (Signed)
 Cardiology Office Note:  .    ID:  Danielle Richard, DOB 1950-08-27, MRN [id number] PCP:  Yvonnie Heritage, NP  Former Cardiology Providers: Dr. Jacquelynn Matter, Theotis Flake PA-C  [facility] Providers Cardiologist:  Olinda Bertrand, DO , [facility] (established care  03/28/23 ) Electrophysiologist:  None  Click to update primary MD,subspecialty MD or APP then REFRESH:1}    Chief Complaint  Patient presents with   Aortic dilatation   Nonrheumatic aortic valve insufficiency   Follow-up   Pre-op Exam    History of Present Illness: .   Danielle Richard is a 73 y.o. [name]  female whose past medical history and cardiovascular risk factors includes: Hypertension, hyperlipidemia, non-insulin-dependent diabetes mellitus type 2, GERD, peptic ulcer disease, glaucoma, Aortic atherosclerosis, dilated ascending aorta, postmenopausal female, advanced age.   Patient predominately speaks [name] and he is here accompanied by her daughter and Adult nurse who provides services between [place] and [name]  She presents today for 8-month follow-up visit.  He is planning to undergo colonoscopy and EGD with her gastroenterology and is requesting cardiovascular evaluation prior to the procedure.  She denies anginal chest pain or heart failure symptoms.  No known significant valvular heart disease, serum creatinine levels close to 1 mg/dL as of July 2024, has recently undergone echo and stress test which were overall favorable.  Overall functional capacity remains relatively stable.  She walks at least 10 to 15 minutes/day, and enjoys gardening.  Prior to establishing care patient's blood pressures used to Danielle Richard ranging between 130-155 mmHg with up titration of medical therapy her blood pressures now at home are usually around 120-130 mmHg.  Ambulatory blood pressure log reviewed.  Review of Systems: .   Review of Systems  Cardiovascular:  Negative for chest pain, claudication, irregular heartbeat, leg  swelling, near-syncope, orthopnea, palpitations, paroxysmal nocturnal dyspnea and syncope.  Respiratory:  Negative for shortness of breath.   Hematologic/Lymphatic: Negative for bleeding problem.    Studies Reviewed:   EKG: EKG Interpretation Date/Time:  Wednesday November 20 2023 15:33:52 EDT Ventricular Rate:  64 PR Interval:  146 QRS Duration:  78 QT Interval:  454 QTC Calculation: 468 R Axis:   2  Text Interpretation: Normal sinus rhythm Minimal voltage criteria for LVH, may Danielle Richard normal variant ( R in aVL ) When compared with ECG of 11-Jul-2016 10:50, ST no longer elevated in Inferior leads Nonspecific T wave abnormality no longer evident in Lateral leads Confirmed by Olinda Bertrand [phone]) on 11/20/2023 4:23:29 PM  Echocardiogram: 04/2019: LVEF 55 to 60%. Moderate LVH. Mild dilatation of the ascending aorta 36 mm. Right ventricular size and function normal. Diastolic function-impaired relaxation. Moderate AR. Estimated RAP 3 mmHg No pulmonary hypertension, RVSP 22 mmHg   03/13/2023: Normal LV systolic function with visual EF 60-65%. Left ventricle cavity is normal in size. Normal left ventricular wall thickness. Normal global wall motion. Normal diastolic filling pattern, normal LAP. Native trileaflet aortic valve. No evidence of aortic stenosis. Mild to moderate aortic regurgitation, eccentric jet, along anterior mitral valve leaflet. Mild tricuspid regurgitation. No evidence of pulmonary hypertension. The aortic root is normal. Proximal ascending aorta dilated, 40 mm. Compared to 04/2019: Ascending aorta reported to Danielle Richard 36mm now 40mm, moderate AR is now mild/moderate, otherwise no significant change.      Stress Testing: Lexiscan  Tetrofosmin  stress test 02/04/2023: Low risk study.    Carotid duplex: 04/2019 Right Carotid: There is no evidence of stenosis in the right ICA.    Left Carotid: There is no  evidence of stenosis in the left ICA.    Vertebrals:  Bilateral  vertebral arteries demonstrate antegrade flow.  Subclavians: Normal flow hemodynamics were seen in bilateral subclavian arteries.   RADIOLOGY:  CT chest w/o contrast:  02/19/2023 1. 3 mm endobronchial nodule or focal mucous plug in a right upper lobe bronchus. Since this is endobronchial, a follow-up chest CT without contrast is recommended in 6 months. 2. Borderline dilatation of the ascending thoracic aorta with a diameter of 4.0 cm. Recommend annual imaging followup by CTA or MRA.This recommendation follows 2010 ACCF/AHA/AATS/ACR/ASA/SCA/SCAI/SIR/STS/SVM Guidelines for the Diagnosis and Management of Patients with Thoracic Aortic Disease. Circulation. 2010; 121: X324-M010. Aortic aneurysm NOS (ICD10-I71.9) 3. Borderline cardiomegaly. 4. Calcific coronary artery and aortic atherosclerosis.   CT chest without contrast: March 2025 Per radiology report no thoracic aortic aneurysm. Aortic atherosclerosis. Mild CAC. See report for additional details     Risk Assessment/Calculations:   NA   Labs:    Results Component Value Reference Range Notes  Lipid Panel Reviewed date:02/20/2023 09:12:41 AM Interpretation: Performing UVO:ZDGUYQI [place], [address] [place], [place] [id number], Phone - [phone], Director - MDNagendra Notes/Report:  Cholesterol, Total 133 100-199 mg/dL    Triglycerides 518 8-416 mg/dL    HDL Cholesterol 58 >60 mg/dL    VLDL Cholesterol Cal 23 5-40 mg/dL    LDL Chol Calc (NIH) 52 0-99 mg/dL    Microalbumin/Creatinine, Random Urine Sample Reviewed date:02/21/2023 07:27:25 AM Interpretation: Performing YTK:ZSWFUXN [place], [address], [place], [place] [address], Phone - [phone], Director - MDNagendra Notes/Report:  Creatinine, Urine 37.1 Not Estab. mg/dL    Albumin, Urine 5.1 Not Estab. ug/mL    Alb/Creat Ratio 14 0-29 mg/g creat Normal: 0 - 29  Moderately increased: 30 - 300  Severely increased: >300   CBC With Platelet And  Differential Reviewed date:02/20/2023 09:12:41 AM Interpretation: Performing SEG:BTDVVOH [place], [address], [place], [place] [id number], Phone - [phone], Director - MDNagendra Notes/Report:  WBC 5.5 3.4-10.8 x10E3/uL    RBC 3.95 3.77-5.28 x10E6/uL    Hemoglobin 12.0 11.1-15.9 g/dL    Hematocrit 35.0 09.3-81.8 %    MCV 92 79-97 fL    MCH 30.4 26.6-33.0 pg    MCHC 33.1 31.5-35.7 g/dL    RDW 29.9 37.1-69.6 %    Platelets 348 150-450 x10E3/uL    Neutrophils 53 Not Estab. %    Lymphs 35 Not Estab. %    Monocytes 9 Not Estab. %    Eos 3 Not Estab. %    Basos 0 Not Estab. %    Neutrophils (Absolute) 2.8 1.4-7.0 x10E3/uL    Lymphs (Absolute) 1.9 0.7-3.1 x10E3/uL    Monocytes(Absolute) 0.5 0.1-0.9 x10E3/uL    Eos (Absolute) 0.2 0.0-0.4 x10E3/uL    Baso (Absolute) 0.0 0.0-0.2 x10E3/uL    Immature Granulocytes 0 Not Estab. %    Immature Grans (Abs) 0.0 0.0-0.1 x10E3/uL    Comprehensive Metabolic Panel (CMP) Reviewed VELF:81/08/7508 09:12:41 AM Interpretation: Performing CHE:NIDPOEU , [address], [place], [place] [id number], Phone - [phone], Director - MDNagendra Notes/Report:  Glucose 88 70-99 mg/dL    BUN 15 9-50 mg/dL    Creatinine 9.32 6.71-2.45 mg/dL    eGFR 59 >80 DX/IPJ/8.25    BUN/Creatinine Ratio 15 12-28    Sodium 143 134-144 mmol/L    Potassium 4.7 3.5-5.2 mmol/L    Chloride 103 96-106 mmol/L    Carbon Dioxide, Total 24 20-29 mmol/L    Calcium  9.6 8.7-10.3 mg/dL    Protein, Total 6.6 0.5-3.9 g/dL    Albumin 4.6  3.8-4.8 g/dL    Globulin, Total 2.0 1.5-4.5 g/dL    Bilirubin, Total 0.3 0.0-1.2 mg/dL    Alkaline Phosphatase 44 44-121 IU/L    AST (SGOT) 24 0-40 IU/L    ALT (SGPT) 20 0-32 IU/L    Hemoglobin A1C Reviewed date:02/20/2023 09:12:41 AM Interpretation: Performing VWU:JWJXBJY [place], [address] [place], [place] [id number], Phone - [phone], Director - MDNagendra Notes/Report:  Hemoglobin A1c 6.2 4.8-5.6 %     Physical Exam:     Today's Vitals   11/20/23 1535  BP: 128/69  Pulse: 67  Resp: 16  SpO2: 97%  Weight: 103 lb (46.7 kg)  Height: 5\' 1"  (1.549 m)   Body mass index is 19.46 kg/m. Wt Readings from Last 3 Encounters:  11/20/23 103 lb (46.7 kg)  11/06/23 107 lb 6.4 oz (48.7 kg)  04/18/23 107 lb (48.5 kg)    Physical Exam  Constitutional: No distress.  hemodynamically stable  Neck: No JVD present.  Cardiovascular: Normal rate, regular rhythm, S1 normal and S2 normal. Exam reveals no gallop, no S3 and no S4.  Murmur heard. High-pitched blowing decrescendo diastolic murmur is present with a grade of 3/4 at the upper right sternal border radiating to the apex. Pulses:      Radial pulses are 2+ on the right side and 2+ on the left side.       Dorsalis pedis pulses are 2+ on the right side and 2+ on the left side.       Posterior tibial pulses are 2+ on the right side and 2+ on the left side.  Pulmonary/Chest: Effort normal and breath sounds normal. No stridor. She has no wheezes. She has no rales.  Musculoskeletal:        General: No edema.     Cervical back: Neck supple.  Skin: Skin is warm.     Impression & Recommendation(s):  Impression:   ICD-10-CM   1. Preprocedural cardiovascular examination  Z01.810 EKG 12-Lead    2. Aortic dilatation (HCC)  I77.819     3. Nonrheumatic aortic valve insufficiency  I35.1 ECHOCARDIOGRAM COMPLETE    4. Essential hypertension  I10     5. Non-insulin dependent type 2 diabetes mellitus (HCC)  E11.9     6. Dyslipidemia  E78.5        Recommendation(s):  Preprocedural cardiovascular examination Patient is being considered for endoscopy as well as colonoscopy with Dr. Feliberto Hopping Tentatively scheduled for May 2025. No anginal chest pain or heart failure symptoms. EKG nonischemic According to the Revised Cardiac Risk Index (RCRI), her Perioperative Risk of Major Cardiac Event is (%): 0.4 Her Functional Capacity in METs is: 5.62 according to the Duke Activity  Status Index (DASI). Patient is overall acceptable candidate for upcoming noncardiac procedures.  No additional workup is warranted at this time as patient is overall compensated from a cardiovascular standpoint  Aortic dilatation (HCC) CT of the chest without contrast in July 2024 documented borderline dilatation of the ascending aorta at 40 mm. Initial plan was to follow-up with her annual screening in July 2025. Patient had a CT of the chest without contrast in March 2025 which documented no aneurysmal dilatation of the ascending aorta, please see radiology report. Will hold off on additional testing at this time. Reemphasized the importance of blood pressure management  Nonrheumatic aortic valve insufficiency Currently asymptomatic. Last echo August 2024 noted mild to moderate aortic regurgitation. Will repeat an echocardiogram in April 2026 prior to next office visit  Essential hypertension Office blood  pressures are very well-controlled. Home blood pressures have improved. Ambulatory blood pressure log reviewed. Reemphasized importance of low-salt diet.  Non-insulin dependent type 2 diabetes mellitus (HCC) Reemphasized importance of glycemic control Currently on ARB, statin therapy Follows with PCP.  Dyslipidemia Continue Lipitor 40 mg p.o. nightly Most recent lipid profile from July 2024 independently reviewed which noted LDL at 52 mg/dL which is very well-controlled Patient does not endorse myalgias. Continue current medical therapy  As part of today's office visit plan of care discussed with the patient with the assistance of a professional interpretation services, to convey the recommendations for the upcoming endoscopy spoke to both of her daughters as well over the phone, reviewed the CT scan results including the images as mentioned above from March 2025, prior labs from Care Everywhere July 2024 reviewed and mentioned above, additional diagnostic testing ordered,  prescription drug management, and coordination of care.  Orders Placed:  Orders Placed This Encounter  Procedures   EKG 12-Lead   ECHOCARDIOGRAM COMPLETE    Standing Status:   Future    Expected Date:   11/19/2024    Expiration Date:   11/19/2024    Where should this test Patrizia performed:   [facility])    Does the patient weigh less than or greater than 250 lbs?:   Patient weighs less than 250 lbs    Perflutren DEFINITY (image enhancing agent) should Antavia administered unless hypersensitivity or allergy exist:   Administer Perflutren    Reason for exam-Echo:   Other-Full Diagnosis List    Full ICD-10/Reason for Exam:   Aortic regurgitation [161096]     Final Medication List:   No orders of the defined types were placed in this encounter.   Medications Discontinued During This Encounter  Medication Reason   lisinopril  (ZESTRIL ) 10 MG tablet Change in therapy     Current Outpatient Medications:    amLODipine  (NORVASC ) 5 MG tablet, Take 1 tablet (5 mg total) by mouth daily at 10 pm., Disp: 90 tablet, Rfl: 1   atorvastatin  (LIPITOR) 40 MG tablet, Take 1 tablet (40 mg total) by mouth daily., Disp: 90 tablet, Rfl: 1   Blood Glucose Monitoring Suppl (TRUE METRIX METER) DEVI, 1 each by Does not apply route 3 (three) times daily before meals., Disp: 1 Device, Rfl: 0   chlorpheniramine (CHLOR-TRIMETON) 4 MG tablet, Take 4 mg by mouth every 4 (four) hours as needed for allergies., Disp: , Rfl:    citalopram  (CELEXA ) 20 MG tablet, Take 1 tablet (20 mg total) by mouth every morning., Disp: 60 tablet, Rfl: 3   dicyclomine (BENTYL) 10 MG capsule, Take 10 mg by mouth 4 (four) times daily -  before meals and at bedtime., Disp: , Rfl:    glucose blood (TRUE METRIX BLOOD GLUCOSE TEST) test strip, Use 3 times daily before meals, Disp: 60 each, Rfl: 12   latanoprost (XALATAN) 0.005 % ophthalmic solution, PLACE ONE DROP INTO BOTH EYES AT BEDTIME, Disp: , Rfl: 4   metFORMIN  (GLUCOPHAGE )  500 MG tablet, TAKE 1 TABLET (500 MG TOTAL) BY MOUTH 2 TIMES DAILY WITH A MEAL., Disp: 60 tablet, Rfl: 5   omeprazole (PRILOSEC) 40 MG capsule, Take 40 mg by mouth in the morning and at bedtime., Disp: , Rfl:    PROLIA  60 MG/ML SOSY injection, INJECT 60 MG Q 6 MONTHS, Disp: , Rfl: 0   sucralfate (CARAFATE) 1 g tablet, Take 1 tablet by mouth. On an empty stomach three times daily before meals,  Disp: , Rfl: 4   traZODone  (DESYREL ) 100 MG tablet, Take 1 tablet (100 mg total) by mouth at bedtime., Disp: 30 tablet, Rfl: 5   TRUEPLUS LANCETS 28G MISC, 1 each by Does not apply route 3 (three) times daily before meals., Disp: 100 each, Rfl: 12   losartan  (COZAAR ) 100 MG tablet, Take 1 tablet (100 mg total) by mouth every morning., Disp: 180 tablet, Rfl: 0  Consent:   NA  Disposition:   May 2026  Her questions and concerns were addressed to her satisfaction. She voices understanding of the recommendations provided during this encounter.    Signed, Olinda Bertrand, DO, [facility]  [address] [place], [place] [address]

## 2023-11-20 NOTE — Patient Instructions (Signed)
 Medication Instructions:  Your physician recommends that you continue on your current medications as directed. Please refer to the Current Medication list given to you today.  *If you need a refill on your cardiac medications before your next appointment, please call your pharmacy*  Lab Work: None ordered today. If you have labs (blood work) drawn today and your tests are completely normal, you will receive your results only by: MyChart Message (if you have MyChart) OR A paper copy in the mail If you have any lab test that is abnormal or we need to change your treatment, we will call you to review the results.  Testing/Procedures: Your physician has requested that you have an echocardiogram to [place] completed in April 2026. Echocardiography is a painless test that uses sound waves to create images of your heart. It provides your doctor with information about the size and shape of your heart and how well your heart's chambers and valves are working. This procedure takes approximately one hour. There are no restrictions for this procedure. Please do NOT wear cologne, perfume, aftershave, or lotions (deodorant is allowed). Please arrive 15 minutes prior to your appointment time.  Please note: We ask at that you not bring children with you during ultrasound (echo/ vascular) testing. Due to room size and safety concerns, children are not allowed in the ultrasound rooms during exams. Our front office staff cannot provide observation of children in our lobby area while testing is being conducted. An adult accompanying a patient to their appointment will only [place] allowed in the ultrasound room at the discretion of the ultrasound technician under special circumstances. We apologize for any inconvenience.   Follow-Up: At [facility], you and your health needs are our priority.  As part of our continuing mission to provide you with exceptional heart care, we have created designated Provider Care Teams.   These Care Teams include your primary Cardiologist (physician) and Advanced Practice Providers (APPs -  Physician Assistants and Nurse Practitioners) who all work together to provide you with the care you need, when you need it.  Your next appointment:   May 2026   The format for your next appointment:   In Person  Provider:   Olinda Bertrand, [facility]  Other Instructions   1st Floor: - Lobby - Registration  - Pharmacy  - Lab - Cafe  2nd Floor: - PV Lab - Diagnostic Testing (echo, CT, nuclear med)  3rd Floor: - Vacant  4th Floor: - TCTS (cardiothoracic surgery) - AFib Clinic - Structural Heart Clinic - Vascular Surgery  - Vascular Ultrasound  5th Floor: - HeartCare Cardiology (general and EP) - Clinical Pharmacy for coumadin, hypertension, lipid, weight-loss medications, and med management appointments    Valet parking services will [place] available as well.

## 2023-11-24 ENCOUNTER — Encounter: Payer: Self-pay | Admitting: Cardiology

## 2023-12-13 DIAGNOSIS — M810 Age-related osteoporosis without current pathological fracture: Secondary | ICD-10-CM | POA: Diagnosis not present

## 2023-12-18 DIAGNOSIS — Z1211 Encounter for screening for malignant neoplasm of colon: Secondary | ICD-10-CM | POA: Diagnosis not present

## 2023-12-18 DIAGNOSIS — K219 Gastro-esophageal reflux disease without esophagitis: Secondary | ICD-10-CM | POA: Diagnosis not present

## 2023-12-18 DIAGNOSIS — K317 Polyp of stomach and duodenum: Secondary | ICD-10-CM | POA: Diagnosis not present

## 2023-12-18 DIAGNOSIS — K2930 Chronic superficial gastritis without bleeding: Secondary | ICD-10-CM | POA: Diagnosis not present

## 2023-12-18 DIAGNOSIS — K3189 Other diseases of stomach and duodenum: Secondary | ICD-10-CM | POA: Diagnosis not present

## 2023-12-20 DIAGNOSIS — K317 Polyp of stomach and duodenum: Secondary | ICD-10-CM | POA: Diagnosis not present

## 2023-12-20 DIAGNOSIS — K2930 Chronic superficial gastritis without bleeding: Secondary | ICD-10-CM | POA: Diagnosis not present

## 2023-12-20 DIAGNOSIS — K3189 Other diseases of stomach and duodenum: Secondary | ICD-10-CM | POA: Diagnosis not present

## 2024-01-29 DIAGNOSIS — R7309 Other abnormal glucose: Secondary | ICD-10-CM | POA: Diagnosis not present

## 2024-01-29 DIAGNOSIS — H401223 Low-tension glaucoma, left eye, severe stage: Secondary | ICD-10-CM | POA: Diagnosis not present

## 2024-01-29 DIAGNOSIS — H25813 Combined forms of age-related cataract, bilateral: Secondary | ICD-10-CM | POA: Diagnosis not present

## 2024-01-29 DIAGNOSIS — H401212 Low-tension glaucoma, right eye, moderate stage: Secondary | ICD-10-CM | POA: Diagnosis not present

## 2024-02-25 DIAGNOSIS — L299 Pruritus, unspecified: Secondary | ICD-10-CM | POA: Diagnosis not present

## 2024-02-25 DIAGNOSIS — R21 Rash and other nonspecific skin eruption: Secondary | ICD-10-CM | POA: Diagnosis not present

## 2024-02-25 DIAGNOSIS — G4733 Obstructive sleep apnea (adult) (pediatric): Secondary | ICD-10-CM | POA: Diagnosis not present

## 2024-04-06 DIAGNOSIS — G4733 Obstructive sleep apnea (adult) (pediatric): Secondary | ICD-10-CM | POA: Diagnosis not present

## 2024-04-22 DIAGNOSIS — J3089 Other allergic rhinitis: Secondary | ICD-10-CM | POA: Diagnosis not present

## 2024-04-22 DIAGNOSIS — J301 Allergic rhinitis due to pollen: Secondary | ICD-10-CM | POA: Diagnosis not present

## 2024-04-22 DIAGNOSIS — R21 Rash and other nonspecific skin eruption: Secondary | ICD-10-CM | POA: Diagnosis not present

## 2024-04-22 DIAGNOSIS — R062 Wheezing: Secondary | ICD-10-CM | POA: Diagnosis not present

## 2024-04-29 DIAGNOSIS — T781XXA Other adverse food reactions, not elsewhere classified, initial encounter: Secondary | ICD-10-CM | POA: Diagnosis not present

## 2024-04-29 DIAGNOSIS — R7309 Other abnormal glucose: Secondary | ICD-10-CM | POA: Diagnosis not present

## 2024-04-29 DIAGNOSIS — H40033 Anatomical narrow angle, bilateral: Secondary | ICD-10-CM | POA: Diagnosis not present

## 2024-04-29 DIAGNOSIS — H25813 Combined forms of age-related cataract, bilateral: Secondary | ICD-10-CM | POA: Diagnosis not present

## 2024-05-27 DIAGNOSIS — G4733 Obstructive sleep apnea (adult) (pediatric): Secondary | ICD-10-CM | POA: Diagnosis not present

## 2024-11-19 ENCOUNTER — Other Ambulatory Visit (HOSPITAL_COMMUNITY)
